# Patient Record
Sex: Female | Born: 1960 | Race: White | Hispanic: No | Smoking: Current every day smoker
Health system: Southern US, Community
[De-identification: ages and names within clinical notes are randomized; demographics above are authoritative.]

## PROBLEM LIST (undated history)

## (undated) DIAGNOSIS — G8929 Other chronic pain: Secondary | ICD-10-CM

## (undated) DIAGNOSIS — F1910 Other psychoactive substance abuse, uncomplicated: Secondary | ICD-10-CM

## (undated) DIAGNOSIS — J449 Chronic obstructive pulmonary disease, unspecified: Secondary | ICD-10-CM

## (undated) DIAGNOSIS — E119 Type 2 diabetes mellitus without complications: Secondary | ICD-10-CM

## (undated) DIAGNOSIS — I10 Essential (primary) hypertension: Secondary | ICD-10-CM

## (undated) DIAGNOSIS — F32A Depression, unspecified: Secondary | ICD-10-CM

## (undated) DIAGNOSIS — F319 Bipolar disorder, unspecified: Secondary | ICD-10-CM

## (undated) DIAGNOSIS — F329 Major depressive disorder, single episode, unspecified: Secondary | ICD-10-CM

## (undated) HISTORY — PX: SKIN CANCER EXCISION: SHX779

## (undated) HISTORY — PX: TUMOR REMOVAL: SHX12

## (undated) HISTORY — PX: BACK SURGERY: SHX140

## (undated) HISTORY — PX: CARPAL TUNNEL RELEASE: SHX101

---

## 2003-10-31 ENCOUNTER — Ambulatory Visit: Payer: Self-pay | Admitting: Pain Medicine

## 2003-11-30 ENCOUNTER — Ambulatory Visit: Payer: Self-pay | Admitting: Pain Medicine

## 2004-01-02 ENCOUNTER — Ambulatory Visit: Payer: Self-pay | Admitting: Pain Medicine

## 2004-01-25 ENCOUNTER — Ambulatory Visit: Payer: Self-pay | Admitting: Pain Medicine

## 2004-02-29 ENCOUNTER — Ambulatory Visit: Payer: Self-pay | Admitting: Pain Medicine

## 2004-03-28 ENCOUNTER — Ambulatory Visit: Payer: Self-pay | Admitting: Pain Medicine

## 2004-04-23 ENCOUNTER — Ambulatory Visit: Payer: Self-pay | Admitting: Pain Medicine

## 2004-05-23 ENCOUNTER — Ambulatory Visit: Payer: Self-pay | Admitting: Pain Medicine

## 2004-06-25 ENCOUNTER — Ambulatory Visit: Payer: Self-pay | Admitting: Pain Medicine

## 2004-07-25 ENCOUNTER — Ambulatory Visit: Payer: Self-pay | Admitting: Pain Medicine

## 2004-08-20 ENCOUNTER — Ambulatory Visit: Payer: Self-pay | Admitting: Pain Medicine

## 2004-08-23 ENCOUNTER — Ambulatory Visit: Payer: Self-pay | Admitting: Family Medicine

## 2004-09-26 ENCOUNTER — Ambulatory Visit: Payer: Self-pay | Admitting: Pain Medicine

## 2004-10-22 ENCOUNTER — Ambulatory Visit: Payer: Self-pay | Admitting: Pain Medicine

## 2004-11-21 ENCOUNTER — Ambulatory Visit: Payer: Self-pay | Admitting: Pain Medicine

## 2004-12-24 ENCOUNTER — Ambulatory Visit: Payer: Self-pay | Admitting: Pain Medicine

## 2005-01-21 ENCOUNTER — Ambulatory Visit: Payer: Self-pay | Admitting: Pain Medicine

## 2005-02-18 ENCOUNTER — Ambulatory Visit: Payer: Self-pay | Admitting: Pain Medicine

## 2005-03-18 ENCOUNTER — Ambulatory Visit: Payer: Self-pay | Admitting: Pain Medicine

## 2005-04-17 ENCOUNTER — Ambulatory Visit: Payer: Self-pay | Admitting: Pain Medicine

## 2005-05-15 ENCOUNTER — Ambulatory Visit: Payer: Self-pay | Admitting: Pain Medicine

## 2005-06-19 ENCOUNTER — Ambulatory Visit: Payer: Self-pay | Admitting: Pain Medicine

## 2005-07-22 ENCOUNTER — Ambulatory Visit: Payer: Self-pay | Admitting: Pain Medicine

## 2005-08-21 ENCOUNTER — Ambulatory Visit: Payer: Self-pay | Admitting: Pain Medicine

## 2005-09-23 ENCOUNTER — Ambulatory Visit: Payer: Self-pay | Admitting: Pain Medicine

## 2005-10-23 ENCOUNTER — Ambulatory Visit: Payer: Self-pay | Admitting: Pain Medicine

## 2005-11-18 ENCOUNTER — Ambulatory Visit: Payer: Self-pay | Admitting: Pain Medicine

## 2005-12-10 ENCOUNTER — Ambulatory Visit: Payer: Self-pay

## 2005-12-18 ENCOUNTER — Ambulatory Visit: Payer: Self-pay | Admitting: Pain Medicine

## 2006-01-20 ENCOUNTER — Ambulatory Visit: Payer: Self-pay | Admitting: Pain Medicine

## 2006-02-19 ENCOUNTER — Ambulatory Visit: Payer: Self-pay | Admitting: Pain Medicine

## 2006-03-19 ENCOUNTER — Ambulatory Visit: Payer: Self-pay | Admitting: Pain Medicine

## 2006-04-14 ENCOUNTER — Ambulatory Visit: Payer: Self-pay | Admitting: Pain Medicine

## 2006-05-14 ENCOUNTER — Ambulatory Visit: Payer: Self-pay | Admitting: Pain Medicine

## 2006-06-16 ENCOUNTER — Ambulatory Visit: Payer: Self-pay | Admitting: Pain Medicine

## 2006-07-14 ENCOUNTER — Ambulatory Visit: Payer: Self-pay | Admitting: Internal Medicine

## 2006-07-14 ENCOUNTER — Ambulatory Visit: Payer: Self-pay | Admitting: Pain Medicine

## 2006-07-23 ENCOUNTER — Ambulatory Visit: Payer: Self-pay | Admitting: Family Medicine

## 2006-08-11 ENCOUNTER — Ambulatory Visit: Payer: Self-pay | Admitting: Pain Medicine

## 2006-08-12 ENCOUNTER — Ambulatory Visit: Payer: Self-pay | Admitting: Internal Medicine

## 2006-08-14 ENCOUNTER — Ambulatory Visit: Payer: Self-pay | Admitting: Internal Medicine

## 2006-09-09 ENCOUNTER — Ambulatory Visit: Payer: Self-pay | Admitting: Internal Medicine

## 2006-09-10 ENCOUNTER — Ambulatory Visit: Payer: Self-pay | Admitting: Pain Medicine

## 2006-09-14 ENCOUNTER — Ambulatory Visit: Payer: Self-pay | Admitting: Internal Medicine

## 2006-10-13 ENCOUNTER — Ambulatory Visit: Payer: Self-pay | Admitting: Pain Medicine

## 2006-10-14 ENCOUNTER — Ambulatory Visit: Payer: Self-pay | Admitting: Internal Medicine

## 2006-11-10 ENCOUNTER — Ambulatory Visit: Payer: Self-pay | Admitting: Pain Medicine

## 2006-11-14 ENCOUNTER — Ambulatory Visit: Payer: Self-pay | Admitting: Internal Medicine

## 2006-11-16 ENCOUNTER — Ambulatory Visit: Payer: Self-pay | Admitting: Pain Medicine

## 2006-11-18 ENCOUNTER — Ambulatory Visit: Payer: Self-pay | Admitting: Internal Medicine

## 2006-11-21 ENCOUNTER — Ambulatory Visit: Payer: Self-pay | Admitting: Pain Medicine

## 2006-11-30 ENCOUNTER — Ambulatory Visit: Payer: Self-pay | Admitting: Pain Medicine

## 2006-12-08 ENCOUNTER — Emergency Department: Payer: Self-pay | Admitting: Emergency Medicine

## 2006-12-14 ENCOUNTER — Ambulatory Visit: Payer: Self-pay | Admitting: Internal Medicine

## 2006-12-14 ENCOUNTER — Ambulatory Visit: Payer: Self-pay | Admitting: Pain Medicine

## 2006-12-28 ENCOUNTER — Ambulatory Visit: Payer: Self-pay | Admitting: Pain Medicine

## 2007-01-12 ENCOUNTER — Ambulatory Visit: Payer: Self-pay | Admitting: Pain Medicine

## 2007-01-20 ENCOUNTER — Ambulatory Visit: Payer: Self-pay | Admitting: Pain Medicine

## 2007-02-11 ENCOUNTER — Ambulatory Visit: Payer: Self-pay | Admitting: Pain Medicine

## 2007-02-17 ENCOUNTER — Ambulatory Visit: Payer: Self-pay | Admitting: Pain Medicine

## 2007-03-02 ENCOUNTER — Ambulatory Visit: Payer: Self-pay | Admitting: Pain Medicine

## 2007-03-11 ENCOUNTER — Ambulatory Visit: Payer: Self-pay | Admitting: Pain Medicine

## 2007-03-19 ENCOUNTER — Ambulatory Visit: Payer: Self-pay | Admitting: Pain Medicine

## 2007-04-13 ENCOUNTER — Other Ambulatory Visit: Payer: Self-pay

## 2007-04-13 ENCOUNTER — Ambulatory Visit: Payer: Self-pay | Admitting: Unknown Physician Specialty

## 2007-04-13 ENCOUNTER — Ambulatory Visit: Payer: Self-pay | Admitting: Pain Medicine

## 2007-04-19 ENCOUNTER — Inpatient Hospital Stay: Payer: Self-pay | Admitting: Unknown Physician Specialty

## 2007-05-18 ENCOUNTER — Ambulatory Visit: Payer: Self-pay | Admitting: Pain Medicine

## 2007-05-24 ENCOUNTER — Ambulatory Visit: Payer: Self-pay | Admitting: Pain Medicine

## 2007-06-22 ENCOUNTER — Ambulatory Visit: Payer: Self-pay | Admitting: Pain Medicine

## 2007-06-30 ENCOUNTER — Ambulatory Visit: Payer: Self-pay | Admitting: Pain Medicine

## 2007-07-26 ENCOUNTER — Ambulatory Visit: Payer: Self-pay | Admitting: Pain Medicine

## 2007-08-11 ENCOUNTER — Ambulatory Visit: Payer: Self-pay | Admitting: Pain Medicine

## 2007-08-16 ENCOUNTER — Ambulatory Visit: Payer: Self-pay | Admitting: Pain Medicine

## 2007-09-06 ENCOUNTER — Ambulatory Visit: Payer: Self-pay | Admitting: Pain Medicine

## 2007-10-07 ENCOUNTER — Ambulatory Visit: Payer: Self-pay | Admitting: Pain Medicine

## 2007-11-03 ENCOUNTER — Ambulatory Visit: Payer: Self-pay | Admitting: Pain Medicine

## 2007-12-07 ENCOUNTER — Ambulatory Visit: Payer: Self-pay | Admitting: Pain Medicine

## 2007-12-15 ENCOUNTER — Ambulatory Visit: Payer: Self-pay | Admitting: Pain Medicine

## 2008-01-04 ENCOUNTER — Ambulatory Visit: Payer: Self-pay | Admitting: Pain Medicine

## 2008-02-03 ENCOUNTER — Ambulatory Visit: Payer: Self-pay | Admitting: Pain Medicine

## 2008-02-29 ENCOUNTER — Ambulatory Visit: Payer: Self-pay | Admitting: Pain Medicine

## 2008-03-29 ENCOUNTER — Ambulatory Visit: Payer: Self-pay | Admitting: Pain Medicine

## 2008-04-05 ENCOUNTER — Ambulatory Visit: Payer: Self-pay | Admitting: Pain Medicine

## 2008-05-02 ENCOUNTER — Ambulatory Visit: Payer: Self-pay | Admitting: Pain Medicine

## 2008-05-08 ENCOUNTER — Ambulatory Visit: Payer: Self-pay | Admitting: Pain Medicine

## 2008-06-01 ENCOUNTER — Ambulatory Visit: Payer: Self-pay | Admitting: Pain Medicine

## 2008-06-20 ENCOUNTER — Ambulatory Visit: Payer: Self-pay | Admitting: Family Medicine

## 2008-07-04 ENCOUNTER — Ambulatory Visit: Payer: Self-pay | Admitting: Pain Medicine

## 2008-07-10 ENCOUNTER — Ambulatory Visit: Payer: Self-pay | Admitting: Pain Medicine

## 2008-08-01 ENCOUNTER — Ambulatory Visit: Payer: Self-pay | Admitting: Pain Medicine

## 2008-08-25 ENCOUNTER — Emergency Department: Payer: Self-pay | Admitting: Emergency Medicine

## 2008-08-31 ENCOUNTER — Ambulatory Visit: Payer: Self-pay | Admitting: Pain Medicine

## 2008-09-28 ENCOUNTER — Ambulatory Visit: Payer: Self-pay | Admitting: Pain Medicine

## 2008-10-02 ENCOUNTER — Ambulatory Visit: Payer: Self-pay | Admitting: Pain Medicine

## 2008-10-31 ENCOUNTER — Ambulatory Visit: Payer: Self-pay | Admitting: Pain Medicine

## 2008-11-06 ENCOUNTER — Ambulatory Visit: Payer: Self-pay | Admitting: Pain Medicine

## 2008-11-30 ENCOUNTER — Ambulatory Visit: Payer: Self-pay | Admitting: Pain Medicine

## 2008-12-26 ENCOUNTER — Ambulatory Visit: Payer: Self-pay | Admitting: Pain Medicine

## 2009-01-01 ENCOUNTER — Ambulatory Visit: Payer: Self-pay | Admitting: Pain Medicine

## 2009-01-24 ENCOUNTER — Ambulatory Visit: Payer: Self-pay | Admitting: Pain Medicine

## 2009-02-26 ENCOUNTER — Ambulatory Visit: Payer: Self-pay | Admitting: Pain Medicine

## 2009-03-05 ENCOUNTER — Ambulatory Visit: Payer: Self-pay | Admitting: Pain Medicine

## 2009-03-27 ENCOUNTER — Ambulatory Visit: Payer: Self-pay | Admitting: Pain Medicine

## 2009-04-02 ENCOUNTER — Ambulatory Visit: Payer: Self-pay | Admitting: Pain Medicine

## 2009-04-24 ENCOUNTER — Ambulatory Visit: Payer: Self-pay | Admitting: Pain Medicine

## 2009-05-23 ENCOUNTER — Ambulatory Visit: Payer: Self-pay | Admitting: Pain Medicine

## 2009-06-21 ENCOUNTER — Ambulatory Visit: Payer: Self-pay | Admitting: Pain Medicine

## 2009-07-19 ENCOUNTER — Ambulatory Visit: Payer: Self-pay | Admitting: Pain Medicine

## 2009-07-25 ENCOUNTER — Ambulatory Visit: Payer: Self-pay | Admitting: Pain Medicine

## 2009-11-29 ENCOUNTER — Emergency Department: Payer: Self-pay | Admitting: Unknown Physician Specialty

## 2010-02-20 ENCOUNTER — Ambulatory Visit: Payer: Self-pay | Admitting: Family Medicine

## 2010-04-22 ENCOUNTER — Ambulatory Visit: Payer: Self-pay

## 2010-11-06 ENCOUNTER — Emergency Department: Payer: Self-pay | Admitting: Internal Medicine

## 2010-12-30 ENCOUNTER — Emergency Department: Payer: Self-pay | Admitting: Emergency Medicine

## 2011-11-04 ENCOUNTER — Emergency Department: Payer: Self-pay | Admitting: Emergency Medicine

## 2011-11-04 LAB — BASIC METABOLIC PANEL
Calcium, Total: 8.7 mg/dL (ref 8.5–10.1)
EGFR (Non-African Amer.): >60
Glucose: 104 mg/dL — ABNORMAL HIGH (ref 65–99)
Osmolality: 281 (ref 275–301)
Potassium: 4 mmol/L (ref 3.5–5.1)
Sodium: 141 mmol/L (ref 136–145)

## 2011-11-04 LAB — CBC
HGB: 14.5 g/dL (ref 12.0–16.0)
MCV: 94 fL (ref 80–100)
Platelet: 236 10*3/uL (ref 150–440)
RBC: 4.53 10*6/uL (ref 3.80–5.20)
WBC: 6.8 10*3/uL (ref 3.6–11.0)

## 2011-11-10 LAB — CULTURE, BLOOD (SINGLE)

## 2012-03-30 ENCOUNTER — Emergency Department: Payer: Self-pay | Admitting: Emergency Medicine

## 2012-05-10 ENCOUNTER — Emergency Department: Payer: Self-pay | Admitting: Emergency Medicine

## 2012-09-17 ENCOUNTER — Emergency Department: Payer: Self-pay | Admitting: Emergency Medicine

## 2012-11-08 ENCOUNTER — Emergency Department: Payer: Self-pay | Admitting: Emergency Medicine

## 2013-01-26 ENCOUNTER — Emergency Department: Payer: Self-pay | Admitting: Emergency Medicine

## 2013-01-27 ENCOUNTER — Emergency Department: Payer: Self-pay | Admitting: Emergency Medicine

## 2013-03-03 ENCOUNTER — Emergency Department: Payer: Self-pay | Admitting: Internal Medicine

## 2013-08-17 ENCOUNTER — Emergency Department: Payer: Self-pay | Admitting: Emergency Medicine

## 2013-10-22 ENCOUNTER — Emergency Department: Payer: Self-pay | Admitting: Emergency Medicine

## 2013-10-24 IMAGING — CR DG CHEST 2V
1 series · 3 of 3 positions shown · non-contrast
Comparison: none

REASON FOR EXAM: shortness of breath
COMMENTS:

PROCEDURE:     DXR - DXR CHEST PA (OR AP) AND LATERAL  - November 06, 2010 [DATE]
RESULT:     Comparison: 08/25/2008 and 04/13/2007

[Series 1: view not recorded · 0.17mm/px · 3 of 3 slices shown]
[im 1/3]
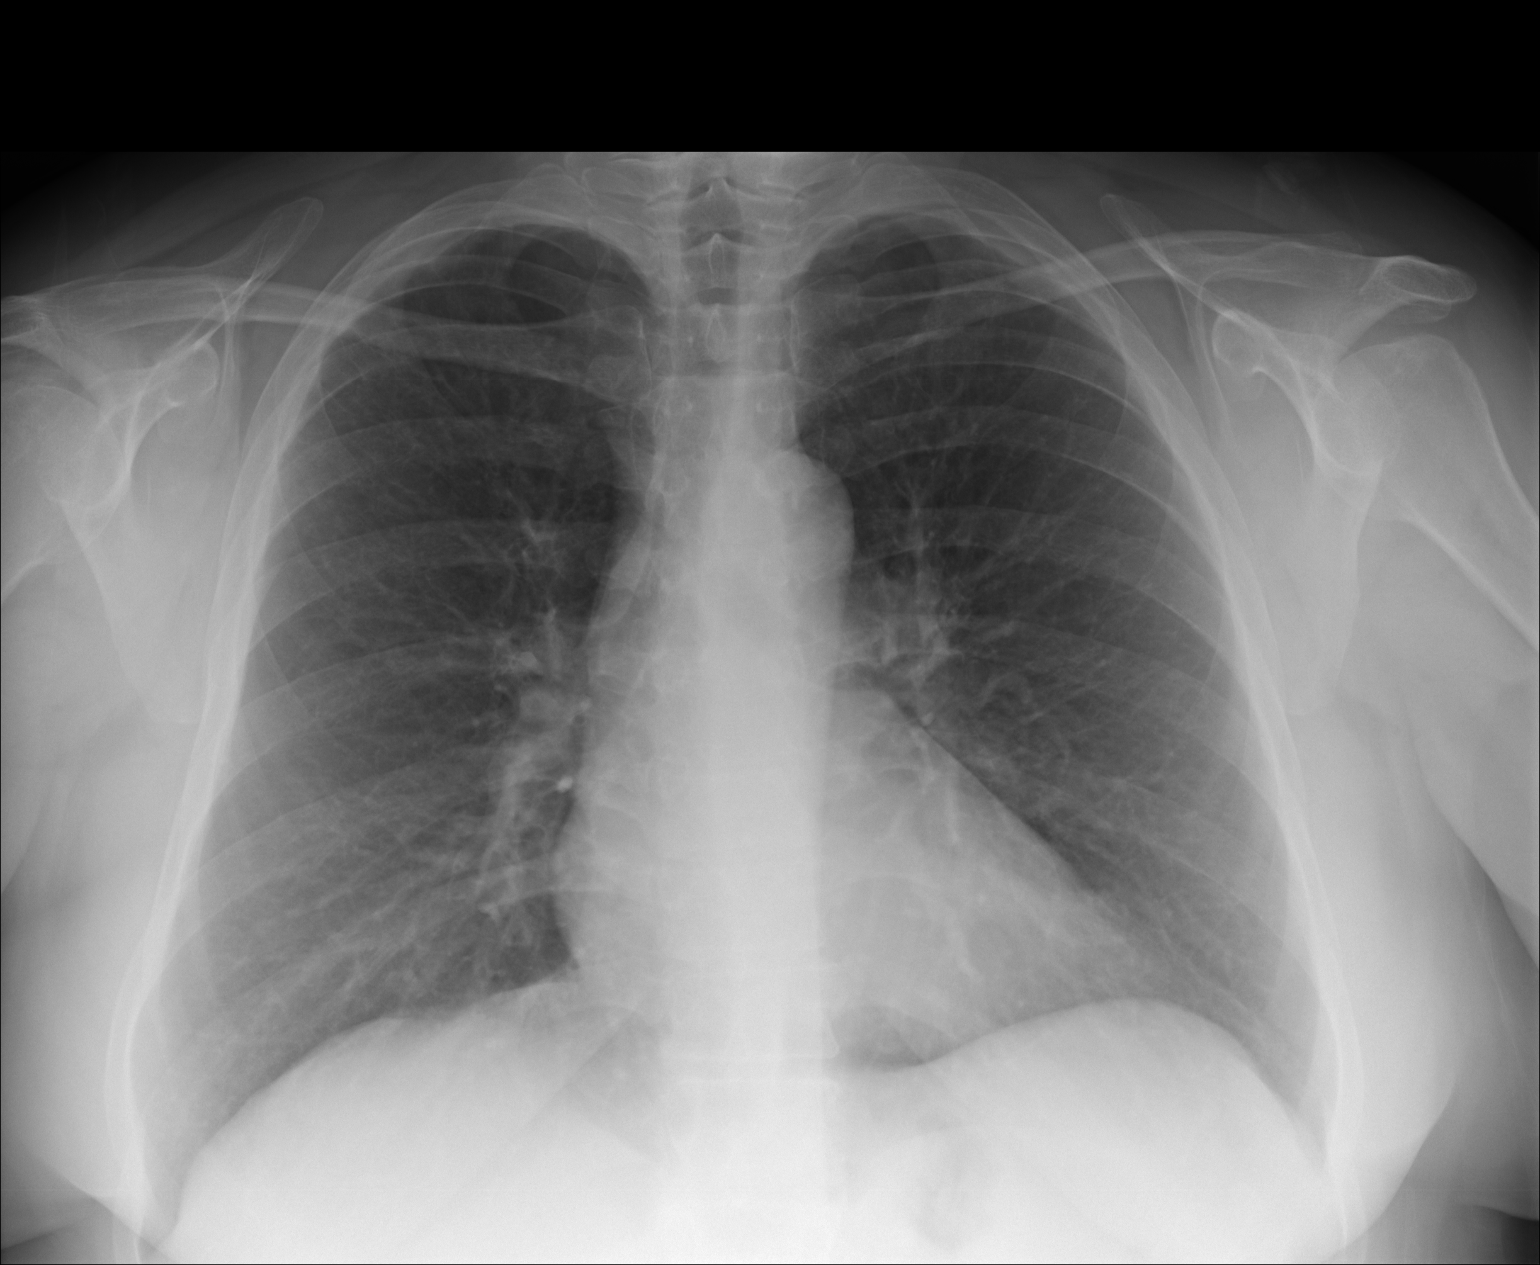
[im 2/3]
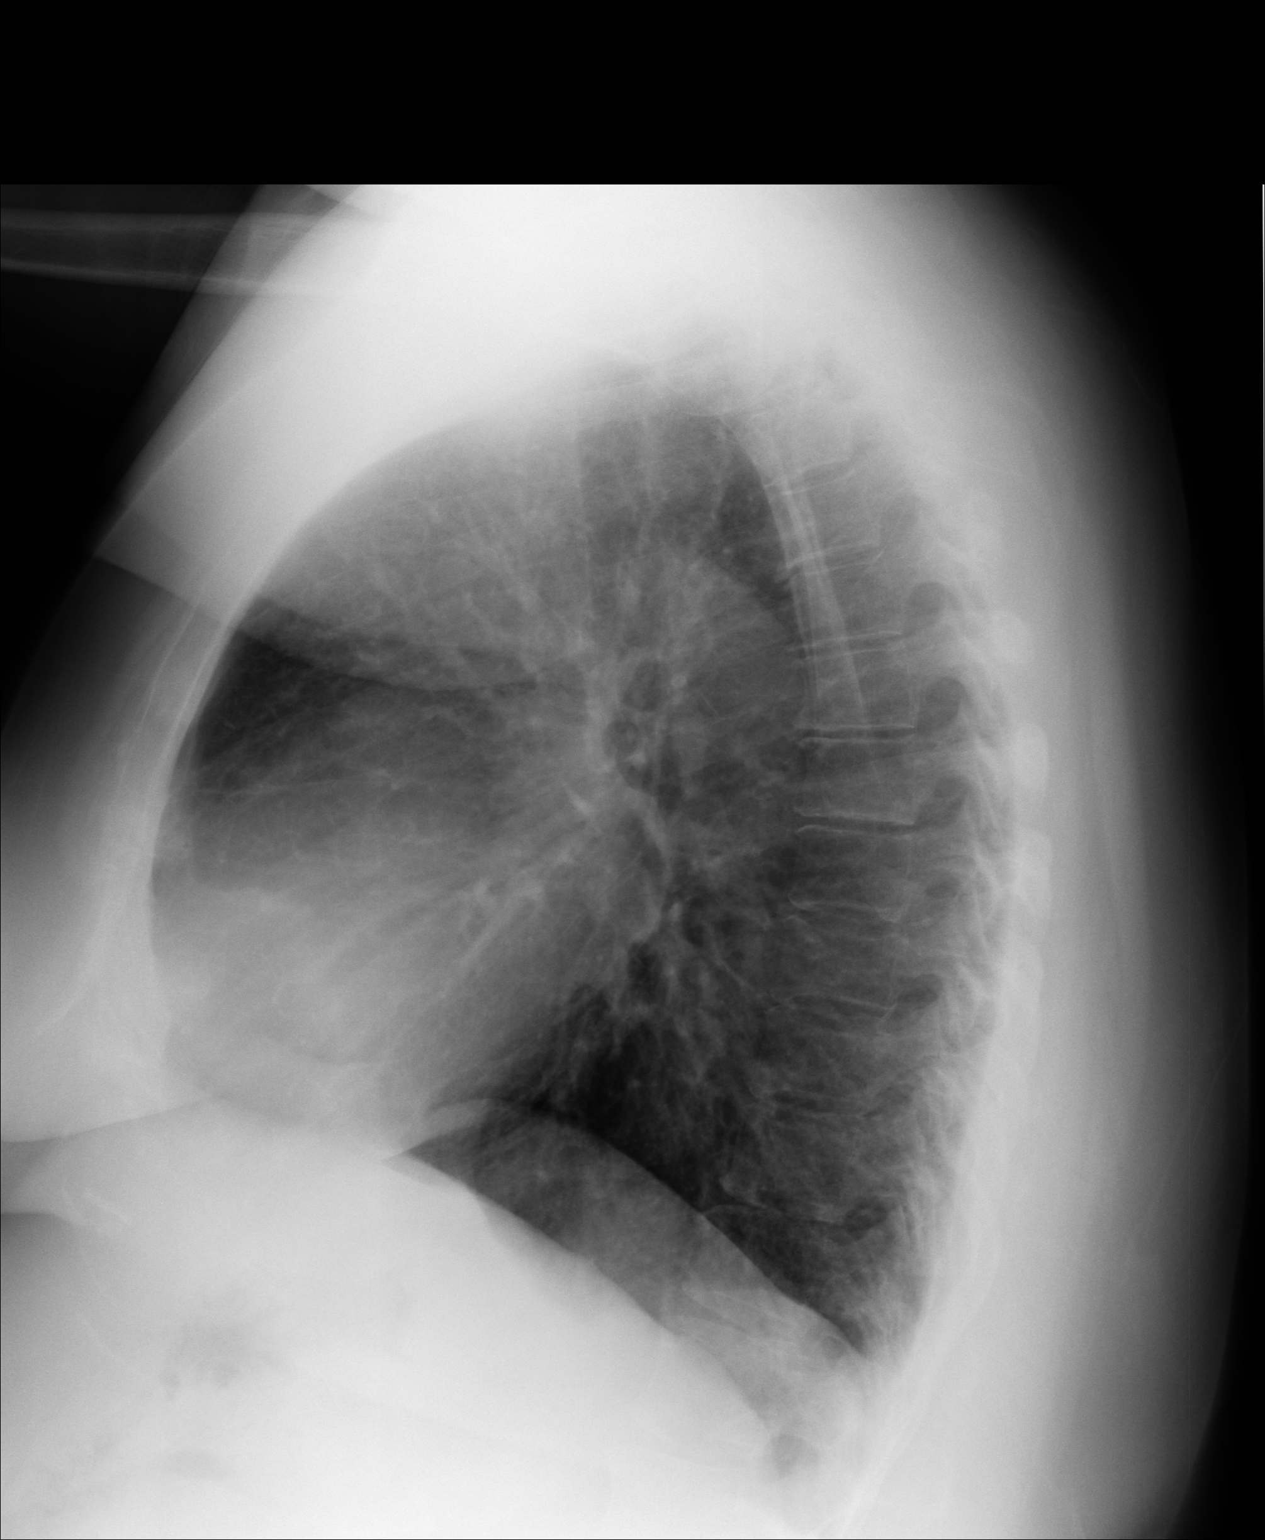
[im 3/3]
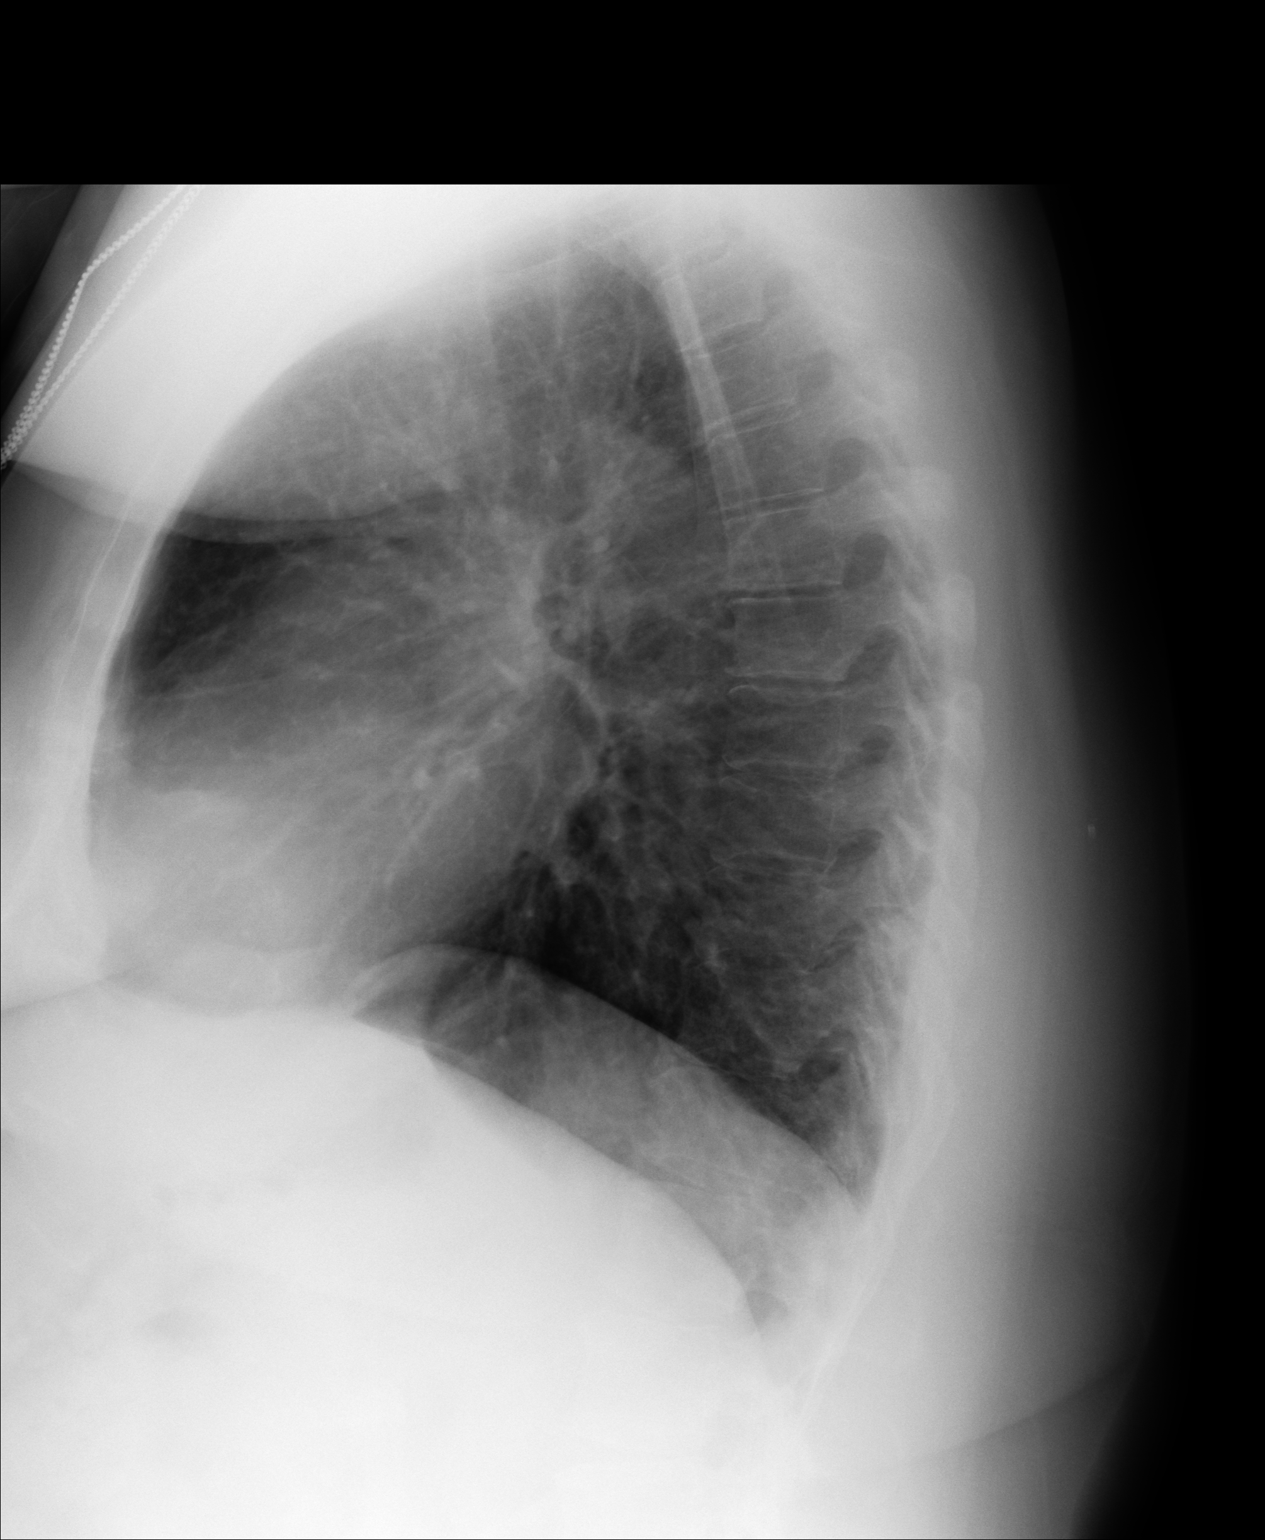

[3 of 3 positions shown; findings below may reference images not displayed]

FINDINGS: The heart and mediastinum are within normal limits. The lungs are clear.
Mild biapical pleural parenchymal scarring is similar to prior.
IMPRESSION: No acute cardiopulmonary disease.

## 2013-12-13 ENCOUNTER — Ambulatory Visit: Payer: Self-pay | Admitting: Family Medicine

## 2014-03-09 ENCOUNTER — Emergency Department: Payer: Self-pay | Admitting: Emergency Medicine

## 2014-06-28 ENCOUNTER — Emergency Department
Admission: EM | Admit: 2014-06-28 | Discharge: 2014-06-28 | Disposition: A | Discharge status: Home / Self Care | Payer: Medicare Other | Attending: Emergency Medicine | Admitting: Emergency Medicine

## 2014-06-28 ENCOUNTER — Encounter: Payer: Self-pay | Admitting: Emergency Medicine

## 2014-06-28 DIAGNOSIS — Z87891 Personal history of nicotine dependence: Secondary | ICD-10-CM | POA: Insufficient documentation

## 2014-06-28 DIAGNOSIS — E119 Type 2 diabetes mellitus without complications: Secondary | ICD-10-CM | POA: Insufficient documentation

## 2014-06-28 DIAGNOSIS — K130 Diseases of lips: Secondary | ICD-10-CM | POA: Diagnosis not present

## 2014-06-28 DIAGNOSIS — I10 Essential (primary) hypertension: Secondary | ICD-10-CM | POA: Insufficient documentation

## 2014-06-28 HISTORY — DX: Depression, unspecified: F32.A

## 2014-06-28 HISTORY — DX: Type 2 diabetes mellitus without complications: E11.9

## 2014-06-28 HISTORY — DX: Major depressive disorder, single episode, unspecified: F32.9

## 2014-06-28 HISTORY — DX: Other chronic pain: G89.29

## 2014-06-28 HISTORY — DX: Essential (primary) hypertension: I10

## 2014-06-28 MED ORDER — LIP MEDEX EX OINT: 1.0000 "application " | TOPICAL_OINTMENT | CUTANEOUS | Status: AC | PRN

## 2014-06-28 MED ORDER — CEPHALEXIN 500 MG PO CAPS: 500.0000 mg | ORAL_CAPSULE | Freq: Four times a day (QID) | ORAL | Status: AC

## 2014-06-28 NOTE — ED Notes (Signed)
Pt reports cut in lip that just "came up" pt states it has been there for 3 weeks. Pt states it will not heal and pain is getting worse.

## 2014-06-28 NOTE — ED Notes (Signed)
Pt informed to return if any life threatening symptoms occur.  

## 2014-06-28 NOTE — ED Provider Notes (Signed)
[facility] Emergency Department Provider Note  ____________________________________________  Time seen: 1331  I have reviewed the triage vital signs and the nursing notes.   HISTORY  Chief Complaint Lip Laceration    HPI Victoria Frey is a 54 y.o. female complains of 3 weeks of a worsening fissure in her lip states that she's been putting wax on it to try to keep it moist and now it's red and tender no other complaints this time denies any trauma to the area states that she is diabetic is concerned about infection rates it as about a 5 out of 10 pain when dries out her with anything sour or spicy and nothing seems to make it particularly better or worse otherwise here today for further evaluation and treatment   Past Medical History  Diagnosis Date  . Diabetes mellitus without complication   . Hypertension   . Chronic pain   . Depression     There are no active problems to display for this patient.   Past Surgical History  Procedure Laterality Date  . Back surgery    . Skin cancer excision    . Carpal tunnel release    . Tumor removal      Current Outpatient Rx  Name  Route  Sig  Dispense  Refill  . cephALEXin (KEFLEX) 500 MG capsule   Oral   Take 1 capsule (500 mg total) by mouth 4 (four) times daily.   28 capsule   0   . lip balm (CARMEX) ointment   Topical   Apply 1 application topically as needed for lip care.   7 g   0     Allergies Review of patient's allergies indicates no known allergies.  No family history on file.  Social History History  Substance Use Topics  . Smoking status: Former Research scientist (life sciences)  . Smokeless tobacco: Never Used  . Alcohol Use: No    Review of Systems Constitutional: No fever/chills Eyes: No visual changes. ENT: No sore throat. Cardiovascular: Denies chest pain. Respiratory: Denies shortness of breath. Gastrointestinal: No abdominal pain.  No nausea, no vomiting.  No diarrhea.  No  constipation. Genitourinary: Negative for dysuria. Musculoskeletal: Negative for back pain. Skin: Negative for rash. Neurological: Negative for headaches, focal weakness or numbness.  10-point ROS otherwise negative.  ____________________________________________   PHYSICAL EXAM:  VITAL SIGNS: ED Triage Vitals  Enc Vitals Group     BP 06/28/14 1201 162/83 mmHg     Pulse Rate 06/28/14 1201 79     Resp 06/28/14 1201 18     Temp 06/28/14 1201 97.4 F (36.3 C)     Temp Source 06/28/14 1201 Oral     SpO2 06/28/14 1201 97 %     Weight 06/28/14 1201 208 lb (94.348 kg)     Height 06/28/14 1201 5\' 7"  (1.702 m)     Head Cir --      Peak Flow --      Pain Score 06/28/14 1205 5     Pain Loc --      Pain Edu? --      Excl. in [place]? --     Constitutional: Alert and oriented. Well appearing and in no acute distress. Eyes: Conjunctivae are normal. PERRL. EOMI. Head: Atraumatic. Nose: No congestion/rhinnorhea. Mouth/Throat: Lower lip fissure Neck: No stridor.   Cardiovascular: Normal rate, regular rhythm. Grossly normal heart sounds.  Good peripheral circulation. Respiratory: Normal respiratory effort.  No retractions. Lungs CTAB. Musculoskeletal: No lower extremity  tenderness nor edema.  No joint effusions. Neurologic:  Normal speech and language. No gross focal neurologic deficits are appreciated. Speech is normal. No gait instability. Skin:  Skin is warm, dry and intact. No rash noted. Psychiatric: Mood and affect are normal. Speech and behavior are normal.  ____________________________________________   PROCEDURES  Procedure(s) performed: None  Critical Care performed: No  ____________________________________________   INITIAL IMPRESSION / ASSESSMENT AND PLAN / ED COURSE  Pertinent labs & imaging results that were available during my care of the patient were reviewed by me and considered in my medical decision making (see chart for details).  Initial impression dry lip  and lip fissure lip is a little red and swollen we'll start her on Keflex as well as a prescription for lip balm recommend moisturizing her lip and following up with ear nose and throat if it persist return here for any acute concerns or worsening symptoms ____________________________________________   FINAL CLINICAL IMPRESSION(S) / ED DIAGNOSES  Final diagnoses:  Lip fissure     Delainee Tramel Verdene Rio, PA-C 06/28/14 [place], MD 06/29/14 [id number]

## 2014-07-17 ENCOUNTER — Emergency Department
Admission: EM | Admit: 2014-07-17 | Discharge: 2014-07-17 | Disposition: A | Discharge status: Home / Self Care | Payer: Medicare Other | Attending: Emergency Medicine | Admitting: Emergency Medicine

## 2014-07-17 ENCOUNTER — Encounter: Payer: Self-pay | Admitting: *Deleted

## 2014-07-17 DIAGNOSIS — S40812A Abrasion of left upper arm, initial encounter: Secondary | ICD-10-CM | POA: Insufficient documentation

## 2014-07-17 DIAGNOSIS — S8991XA Unspecified injury of right lower leg, initial encounter: Secondary | ICD-10-CM | POA: Diagnosis present

## 2014-07-17 DIAGNOSIS — S20419A Abrasion of unspecified back wall of thorax, initial encounter: Secondary | ICD-10-CM | POA: Diagnosis not present

## 2014-07-17 DIAGNOSIS — E119 Type 2 diabetes mellitus without complications: Secondary | ICD-10-CM | POA: Insufficient documentation

## 2014-07-17 DIAGNOSIS — Y9389 Activity, other specified: Secondary | ICD-10-CM | POA: Diagnosis not present

## 2014-07-17 DIAGNOSIS — S40811A Abrasion of right upper arm, initial encounter: Secondary | ICD-10-CM | POA: Diagnosis not present

## 2014-07-17 DIAGNOSIS — Y998 Other external cause status: Secondary | ICD-10-CM | POA: Diagnosis not present

## 2014-07-17 DIAGNOSIS — I10 Essential (primary) hypertension: Secondary | ICD-10-CM | POA: Insufficient documentation

## 2014-07-17 DIAGNOSIS — Z87891 Personal history of nicotine dependence: Secondary | ICD-10-CM | POA: Diagnosis not present

## 2014-07-17 DIAGNOSIS — S80811A Abrasion, right lower leg, initial encounter: Secondary | ICD-10-CM | POA: Diagnosis not present

## 2014-07-17 DIAGNOSIS — Y92194 Driveway of other specified residential institution as the place of occurrence of the external cause: Secondary | ICD-10-CM | POA: Insufficient documentation

## 2014-07-17 DIAGNOSIS — S80812A Abrasion, left lower leg, initial encounter: Secondary | ICD-10-CM | POA: Insufficient documentation

## 2014-07-17 DIAGNOSIS — T07XXXA Unspecified multiple injuries, initial encounter: Secondary | ICD-10-CM

## 2014-07-17 HISTORY — DX: Bipolar disorder, unspecified: F31.9

## 2014-07-17 MED ORDER — OXYCODONE-ACETAMINOPHEN 5-325 MG PO TABS
2.0000 | ORAL_TABLET | Freq: Once | ORAL | Status: AC
  Administered 2014-07-17: 2 via ORAL

## 2014-07-17 MED ORDER — OXYCODONE-ACETAMINOPHEN 5-325 MG PO TABS
ORAL_TABLET | ORAL | Status: AC
  Filled 2014-07-17: qty 2

## 2014-07-17 MED ORDER — HYDROCODONE-ACETAMINOPHEN 5-325 MG PO TABS: 1.0000 | ORAL_TABLET | ORAL | Status: DC | PRN
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
## 2014-07-17 NOTE — Discharge Instructions (Signed)
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
## 2014-08-02 ENCOUNTER — Emergency Department
Admission: EM | Admit: 2014-08-02 | Discharge: 2014-08-02 | Disposition: A | Discharge status: Home / Self Care | Payer: Medicare Other | Attending: Emergency Medicine | Admitting: Emergency Medicine

## 2014-08-02 ENCOUNTER — Encounter: Payer: Self-pay | Admitting: Emergency Medicine

## 2014-08-02 ENCOUNTER — Emergency Department: Payer: Medicare Other

## 2014-08-02 DIAGNOSIS — J0100 Acute maxillary sinusitis, unspecified: Secondary | ICD-10-CM | POA: Diagnosis not present

## 2014-08-02 DIAGNOSIS — E119 Type 2 diabetes mellitus without complications: Secondary | ICD-10-CM | POA: Diagnosis not present

## 2014-08-02 DIAGNOSIS — Y998 Other external cause status: Secondary | ICD-10-CM | POA: Diagnosis not present

## 2014-08-02 DIAGNOSIS — Z87891 Personal history of nicotine dependence: Secondary | ICD-10-CM | POA: Insufficient documentation

## 2014-08-02 DIAGNOSIS — Y9389 Activity, other specified: Secondary | ICD-10-CM | POA: Insufficient documentation

## 2014-08-02 DIAGNOSIS — I10 Essential (primary) hypertension: Secondary | ICD-10-CM | POA: Diagnosis not present

## 2014-08-02 DIAGNOSIS — Y9289 Other specified places as the place of occurrence of the external cause: Secondary | ICD-10-CM | POA: Diagnosis not present

## 2014-08-02 DIAGNOSIS — M7981 Nontraumatic hematoma of soft tissue: Secondary | ICD-10-CM | POA: Diagnosis not present

## 2014-08-02 DIAGNOSIS — X58XXXA Exposure to other specified factors, initial encounter: Secondary | ICD-10-CM | POA: Insufficient documentation

## 2014-08-02 DIAGNOSIS — S0031XA Abrasion of nose, initial encounter: Secondary | ICD-10-CM | POA: Insufficient documentation

## 2014-08-02 DIAGNOSIS — M25511 Pain in right shoulder: Secondary | ICD-10-CM | POA: Diagnosis present

## 2014-08-02 MED ORDER — MELOXICAM 15 MG PO TABS: 15.0000 mg | ORAL_TABLET | Freq: Every day | ORAL | Status: DC | PRN

## 2014-08-02 MED ORDER — TRAMADOL HCL 50 MG PO TABS: 50.0000 mg | ORAL_TABLET | Freq: Three times a day (TID) | ORAL | Status: DC | PRN

## 2014-08-02 MED ORDER — AMOXICILLIN-POT CLAVULANATE 875-125 MG PO TABS: 1.0000 | ORAL_TABLET | Freq: Two times a day (BID) | ORAL | Status: AC

## 2014-08-02 NOTE — ED Notes (Signed)
Pt with right shoulder pain x 2 months. Sent to orthopedic doc by med doc. Not seen by ortho. No obvious injury. Also c/o sinus infection, sores in nose and "sever" headache

## 2014-08-02 NOTE — Discharge Instructions (Signed)
Take medication as prescribed. Alternate heat and ice for comfort. Wear sling but stretch multiple times a day. Stretch shoulder as discussed and demonstrated multiple times per day. Avoid overly strenuous activity or repetitive motions with right arm.   Follow-up with your primary care physician this week. Follow-up with orthopedics this week for continued shoulder pain.  Return to the ER immediately for increased pain, new or worsening concerns.  Shoulder Pain The shoulder is the joint that connects your arms to your body. The bones that form the shoulder joint include the upper arm bone (humerus), the shoulder blade (scapula), and the collarbone (clavicle). The top of the humerus is shaped like a ball and fits into a rather flat socket on the scapula (glenoid cavity). A combination of muscles and strong, fibrous tissues that connect muscles to bones (tendons) support your shoulder joint and hold the ball in the socket. Small, fluid-filled sacs (bursae) are located in different areas of the joint. They act as cushions between the bones and the overlying soft tissues and help reduce friction between the gliding tendons and the bone as you move your arm. Your shoulder joint allows a wide range of motion in your arm. This range of motion allows you to do things like scratch your back or throw a ball. However, this range of motion also makes your shoulder more prone to pain from overuse and injury. Causes of shoulder pain can originate from both injury and overuse and usually can be grouped in the following four categories:  Redness, swelling, and pain (inflammation) of the tendon (tendinitis) or the bursae (bursitis).  Instability, such as a dislocation of the joint.  Inflammation of the joint (arthritis).  Broken bone (fracture). HOME CARE INSTRUCTIONS   Apply ice to the sore area.  Put ice in a plastic bag.  Place a towel between your skin and the bag.  Leave the ice on for 15-20 minutes,  3-4 times per day for the first 2 days, or as directed by your health care provider.  Stop using cold packs if they do not help with the pain.  If you have a shoulder sling or immobilizer, wear it as long as your caregiver instructs. Only remove it to shower or bathe. Move your arm as little as possible, but keep your hand moving to prevent swelling.  Squeeze a soft ball or foam pad as much as possible to help prevent swelling.  Only take over-the-counter or prescription medicines for pain, discomfort, or fever as directed by your caregiver. SEEK MEDICAL CARE IF:   Your shoulder pain increases, or new pain develops in your arm, hand, or fingers.  Your hand or fingers become cold and numb.  Your pain is not relieved with medicines. SEEK IMMEDIATE MEDICAL CARE IF:   Your arm, hand, or fingers are numb or tingling.  Your arm, hand, or fingers are significantly swollen or turn white or blue. MAKE SURE YOU:   Understand these instructions.  Will watch your condition.  Will get help right away if you are not doing well or get worse. Document Released: 10/09/2004 Document Revised: 05/16/2013 Document Reviewed: 12/14/2010 [facility] Patient Information 2015 [place], [place]. This information is not intended to replace advice given to you by your health care provider. Make sure you discuss any questions you have with your health care provider.  Shoulder Range of Motion Exercises The shoulder is the most flexible joint in the human body. Because of this it is also the most unstable joint in the  body. All ages can develop shoulder problems. Early treatment of problems is necessary for a good outcome. People react to shoulder pain by decreasing the movement of the joint. After a brief period of time, the shoulder can become "frozen". This is an almost complete loss of the ability to move the damaged shoulder. Following injuries your caregivers can give you instructions on exercises to keep your  range of motion (ability to move your shoulder freely), or regain it if it has been lost.  [place]: Codman's Exercise or Pendulum Exercise  This exercise may be performed in a prone (face-down) lying position or standing while leaning on a chair with the opposite arm. Its purpose is to relax the muscles in your shoulder and slowly but surely increase the range of motion and to relieve pain.  Lie on your stomach close to the side edge of the bed. Let your weak arm hang over the edge of the bed. Relax your shoulder, arm and hand. Let your shoulder blade relax and drop down.  Slowly and gently swing your arm forward and back. Do not use your neck muscles; relax them. It might be easier to have someone else gently start swinging your arm.  As pain decreases, increase your swing. To start, arm swing should begin at 15 degree angles. In time and as pain lessens, move to 30-45 degree angles. Start with swinging for about 15 seconds, and work towards swinging for 3 to 5 minutes.  This exercise may also be performed in a standing/bent over position.  Stand and hold onto a sturdy chair with your good arm. Bend forward at the waist and bend your knees slightly to help protect your back. Relax your weak arm, let it hang limp. Relax your shoulder blade and let it drop.  Keep your shoulder relaxed and use body motion to swing your arm in small circles.  Stand up tall and relax.  Repeat motion and change direction of circles.  Start with swinging for about 30 seconds, and work towards swinging for 3 to 5 minutes. STRETCHING EXERCISES:  Lift your arm out in front of you with the elbow bent at 90 degrees. Using your other arm gently pull the elbow forward and across your body.  Bend one arm behind you with the palm facing outward. Using the other arm, hold a towel or rope and reach this arm up above your head, then bend it at the elbow to move your  wrist to behind your neck. Grab the free end of the towel with the hand behind your back. Gently pull the towel up with the hand behind your neck, gradually increasing the pull on the hand behind the small of your back. Then, gradually pull down with the hand behind the small of your back. This will pull the hand and arm behind your neck further. Both shoulders will have an increased range of motion with repetition of this exercise. STRENGTHENING EXERCISES:  Standing with your arm at your side and straight out from your shoulder with the elbow bent at 90 degrees, hold onto a small weight and slowly raise your hand so it points straight up in the air. Repeat this five times to begin with, and gradually increase to ten times. Do this four times per day. As you grow stronger you can gradually increase the weight.  Repeat the above exercise, only this time using an elastic band. Start with your hand up in the air  and pull down until your hand is by your side. As you grow stronger, gradually increase the amount you pull by increasing the number or size of the elastic bands. Use the same amount of repetitions.  Standing with your hand at your side and holding onto a weight, gradually lift the hand in front of you until it is over your head. Do the same also with the hand remaining at your side and lift the hand away from your body until it is again over your head. Repeat this five times to begin with, and gradually increase to ten times. Do this four times per day. As you grow stronger you can gradually increase the weight. Document Released: 09/28/2002 Document Revised: 01/04/2013 Document Reviewed: 12/30/2004 [facility] Patient Information 2015 [place], [place]. This information is not intended to replace advice given to you by your health care provider. Make sure you discuss any questions you have with your health care provider.  Sinusitis Sinusitis is redness, soreness, and inflammation of the paranasal  sinuses. Paranasal sinuses are air pockets within the bones of your face (beneath the eyes, the middle of the forehead, or above the eyes). In healthy paranasal sinuses, mucus is able to drain out, and air is able to circulate through them by way of your nose. However, when your paranasal sinuses are inflamed, mucus and air can become trapped. This can allow bacteria and other germs to grow and cause infection. Sinusitis can develop quickly and last only a short time (acute) or continue over a long period (chronic). Sinusitis that lasts for more than 12 weeks is considered chronic.  CAUSES  Causes of sinusitis include:  Allergies.  Structural abnormalities, such as displacement of the cartilage that separates your nostrils (deviated septum), which can decrease the air flow through your nose and sinuses and affect sinus drainage.  Functional abnormalities, such as when the small hairs (cilia) that line your sinuses and help remove mucus do not work properly or are not present. SIGNS AND SYMPTOMS  Symptoms of acute and chronic sinusitis are the same. The primary symptoms are pain and pressure around the affected sinuses. Other symptoms include:  Upper toothache.  Earache.  Headache.  Bad breath.  Decreased sense of smell and taste.  A cough, which worsens when you are lying flat.  Fatigue.  Fever.  Thick drainage from your nose, which often is green and may contain pus (purulent).  Swelling and warmth over the affected sinuses. DIAGNOSIS  Your health care provider will perform a physical exam. During the exam, your health care provider may:  Look in your nose for signs of abnormal growths in your nostrils (nasal polyps).  Tap over the affected sinus to check for signs of infection.  View the inside of your sinuses (endoscopy) using an imaging device that has a light attached (endoscope). If your health care provider suspects that you have chronic sinusitis, one or more of the  following tests may be recommended:  Allergy tests.  Nasal culture. A sample of mucus is taken from your nose, sent to a lab, and screened for bacteria.  Nasal cytology. A sample of mucus is taken from your nose and examined by your health care provider to determine if your sinusitis is related to an allergy. TREATMENT  Most cases of acute sinusitis are related to a viral infection and will resolve on their own within 10 days. Sometimes medicines are prescribed to help relieve symptoms (pain medicine, decongestants, nasal steroid sprays, or saline sprays).  However,  for sinusitis related to a bacterial infection, your health care provider will prescribe antibiotic medicines. These are medicines that will help kill the bacteria causing the infection.  Rarely, sinusitis is caused by a fungal infection. In theses cases, your health care provider will prescribe antifungal medicine. For some cases of chronic sinusitis, surgery is needed. Generally, these are cases in which sinusitis recurs more than 3 times per year, despite other treatments. HOME CARE INSTRUCTIONS   Drink plenty of water. Water helps thin the mucus so your sinuses can drain more easily.  Use a humidifier.  Inhale steam 3 to 4 times a day (for example, sit in the bathroom with the shower running).  Apply a warm, moist washcloth to your face 3 to 4 times a day, or as directed by your health care provider.  Use saline nasal sprays to help moisten and clean your sinuses.  Take medicines only as directed by your health care provider.  If you were prescribed either an antibiotic or antifungal medicine, finish it all even if you start to feel better. SEEK IMMEDIATE MEDICAL CARE IF:  You have increasing pain or severe headaches.  You have nausea, vomiting, or drowsiness.  You have swelling around your face.  You have vision problems.  You have a stiff neck.  You have difficulty breathing. MAKE SURE YOU:   Understand  these instructions.  Will watch your condition.  Will get help right away if you are not doing well or get worse. Document Released: 12/30/2004 Document Revised: 05/16/2013 Document Reviewed: 01/14/2011 [facility] Patient Information 2015 [place], [place]. This information is not intended to replace advice given to you by your health care provider. Make sure you discuss any questions you have with your health care provider.

## 2014-08-02 NOTE — ED Provider Notes (Signed)
[facility] Department Provider Note  ____________________________________________  Time seen: Approximately 4:05 PM  I have reviewed the triage vital signs and the nursing notes.   HISTORY  Chief Complaint Extremity Pain   HPI Victoria Frey is a 54 y.o. female presents the ER for complaints of right shoulder pain. Patient reports that she has had pain in her right shoulder 2 months. States pain unchanged x 2 months. Patient reports that her primary care physician said that it may be tendinitis, arthritis or rotator cuff injury and has referred her to orthopedics. Patient states that she has not seen orthopedics yet as she owes them money. Patient denies fall or known injury to her right shoulder. Patient states right shoulder pain is primarily with movement. Patient states that it hurts worse when bringing her shoulder out to the side and lifting right shoulder. Patient states that pain currently to her right shoulder is 7 out of 10. Denies paresthesias, numbness or tingling sensations. Denies weakness in right arm or right hand. Denies chest pain or shortness of breath. Denies pain radiation.   Patient also reports that while she is here she wanted to be checked out for sinus infection. Patient states that she has had green purulent runny nose 2 weeks. Also reports 2 weeks with sinus pressure and sinus headache. States headache is in the front of her forehead and around her nose. Denies other headache. Patient reports started as runny nose but then progressively worsened and has had continuous nose discharge and sinus pressure. Denies cough or shortness of breath. Denies fever. Denies dizziness.Denies other complaints. Reports continues to eat and drink well.  Denies dizziness, vision changes, chest pain, shortness of breath, abdominal pain, decreased appetite, extremity pain, weakness, confusion or other complaints.    Past Medical History  Diagnosis  Date  . Diabetes mellitus without complication   . Hypertension   . Chronic pain   . Depression   . Bipolar 1 disorder     There are no active problems to display for this patient.   Past Surgical History  Procedure Laterality Date  . Back surgery    . Skin cancer excision    . Carpal tunnel release    . Tumor removal      Current Outpatient Rx  Name  Route  Sig  Dispense  Refill  .           Marland Kitchen             Allergies Review of patient's allergies indicates no known allergies.  No family history on file.  Social History History  Substance Use Topics  . Smoking status: Former Research scientist (life sciences)  . Smokeless tobacco: Never Used  . Alcohol Use: No   PCP: Posey Pronto  Review of Systems Constitutional: No fever/chills Eyes: No visual changes. ENT: No sore throat. Positive for congestion and sinus pressure. Cardiovascular: Denies chest pain. Respiratory: Denies shortness of breath. Gastrointestinal: No abdominal pain.  No nausea, no vomiting.  No diarrhea.  No constipation. Genitourinary: Negative for dysuria. Musculoskeletal: Negative for back pain. Positive for right shoulder pain. Skin: Negative for rash. Neurological: Positive for headache. Negative for focal weakness or numbness.  10-point ROS otherwise negative.  ____________________________________________   PHYSICAL EXAM:  VITAL SIGNS: ED Triage Vitals  Enc Vitals Group     BP 08/02/14 1541 162/90 mmHg     Pulse Rate 08/02/14 1541 62     Resp 08/02/14 1541 20     Temp 08/02/14 1541  97.5 F (36.4 C)     Temp Source 08/02/14 1541 Oral     SpO2 08/02/14 1541 98 %     Weight 08/02/14 1541 218 lb (98.884 kg)     Height 08/02/14 1541 5\' 7"  (1.702 m)     Head Cir --      Peak Flow --      Pain Score 08/02/14 1549 8     Pain Loc --      Pain Edu? --      Excl. in [place]? --     Constitutional: Alert and oriented. Well appearing and in no acute distress. Eyes: Conjunctivae are normal. PERRL. EOMI. Head: Atraumatic.  Mild to mod maxillary and fontal sinus pressure to palpation.  Ears: no erythema, normal TMs.  Nose: mild rhinorrhea. No intranasal lesions.  Mouth/Throat: Mucous membranes are moist.  Oropharynx non-erythematous. Neck: No stridor.  No cervical spine tenderness to palpation. Hematological/Lymphatic/Immunilogical: No cervical lymphadenopathy. Cardiovascular: Normal rate, regular rhythm. Grossly normal heart sounds.  Good peripheral circulation. Respiratory: Normal respiratory effort.  No retractions. Lungs CTAB. Gastrointestinal: Soft and nontender. No abdominal bruits. No CVA tenderness. normal bowel sounds. Musculoskeletal: No lower or upper extremity tenderness nor edema.  No joint effusions. Except: right shoulder anterior and lateral mod TTP, no swelling, mild small ecchymosis lateral shoulder. Full ROM. Pain with shoulder ROM, worse pain with right arm abduction. No pain to right arm below shoulder. NO cervical, thoracic, or lumbar TTP. Bilateral hand grips equal. Bilateral distal radial pulses equal. 5/5 strength to bilateral upper and lower extremities.  Neurologic:  Normal speech and language. No gross focal neurologic deficits are appreciated. No gait instability. CN 2-12 grossly intact. Alert and oriented.  Skin:  Skin is warm, dry and intact. No rash noted. Except: superficial abrasion small at base of left nare (per patient from rubbing nose). NO other abrasions or lesions noted. Psychiatric: Mood and affect are normal. Speech and behavior are normal.  ____________________________________________   LABS (all labs ordered are listed, but only abnormal results are displayed)  Labs Reviewed - No data to display  RADIOLOGY RIGHT SHOULDER - 2+ VIEW  COMPARISON: None.  FINDINGS: No acute bony abnormality. Glenohumeral joint appears congruent. Subacromial spurring. Unremarkable appearance of the visualized thorax.  IMPRESSION: No acute bony abnormality.  Subacromial  spurring.  Signed,  Dulcy Fanny. Earleen Newport, DO  [facility]  [facility]   Electronically Signed By: Corrie Mckusick D.O. On: 08/02/2014 16:24      I, Marylene Land, personally viewed and evaluated these images as part of my medical decision making.    ____________________________________________   PROCEDURES  Procedure(s) performed:  SPLINT APPLICATION Date/Time: 7:67 PM Authorized by: Marylene Land Consent: Verbal consent obtained. Risks and benefits: risks, benefits and alternatives were discussed Consent given by: patient Splint applied by: ed technician Location details: right sling Post-procedure: The splinted body part was neurovascularly unchanged following the procedure. Patient tolerance: Patient tolerated the procedure well with no immediate complications.     INITIAL IMPRESSION / ASSESSMENT AND PLAN / ED COURSE  Pertinent labs & imaging results that were available during my care of the patient were reviewed by me and considered in my medical decision making (see chart for details).  Presents to the ER for the complaints of 2 months of right shoulder pain. Patient states that right shoulder pain is primarily with movement. Patient states that she has been seen by her primary care physician for the same. Patient states that her  primary care physician told her it may be tendinitis, arthritis or rotator cuff injury. Patient reports that she was referred to orthopedics but has not yet seen orthopedics as she had an outstanding bill with him. Right shoulder x-ray no acute bony abnormality. Positive subacromial spurring. Discussed with patient to follow-up with orthopedics for continued right shoulder pain. We'll provide sling for support. Discussed and demonstrated right shoulder range of motion exercises. We'll treat right shoulder pain with oral 15 mg Mobic daily as well as when necessary tramadol.  Patient also  presented to the ER for the complaints of sinus infection. Patient reports that she has had runny nose, congestion and frontal sinus pressure 2 weeks. Patient reports having sinus headache accompanying sinus drainage. Will treat patient with oral Augmentin for sinusitis. Patient to follow up with primary care physician this week. Patient denies other complaints and very well-appearing patient. Steady gait in ER. Discussed follow-up and return parameters. Patient verbalized understanding and agreed to plan.   ____________________________________________   FINAL CLINICAL IMPRESSION(S) / ED DIAGNOSES  Final diagnoses:  Right shoulder pain  Acute maxillary sinusitis, recurrence not specified      Marylene Land, NP 08/02/14 1722  Orbie Pyo, MD 08/02/14 [id number]

## 2014-08-29 ENCOUNTER — Emergency Department
Admission: EM | Admit: 2014-08-29 | Discharge: 2014-08-30 | Disposition: A | Discharge status: Home / Self Care | Payer: Medicare Other | Attending: Emergency Medicine | Admitting: Emergency Medicine

## 2014-08-29 ENCOUNTER — Encounter: Payer: Self-pay | Admitting: Emergency Medicine

## 2014-08-29 DIAGNOSIS — E119 Type 2 diabetes mellitus without complications: Secondary | ICD-10-CM | POA: Diagnosis not present

## 2014-08-29 DIAGNOSIS — F1410 Cocaine abuse, uncomplicated: Secondary | ICD-10-CM

## 2014-08-29 DIAGNOSIS — Z87891 Personal history of nicotine dependence: Secondary | ICD-10-CM | POA: Diagnosis not present

## 2014-08-29 DIAGNOSIS — G8929 Other chronic pain: Secondary | ICD-10-CM | POA: Diagnosis not present

## 2014-08-29 DIAGNOSIS — F3130 Bipolar disorder, current episode depressed, mild or moderate severity, unspecified: Secondary | ICD-10-CM

## 2014-08-29 DIAGNOSIS — F331 Major depressive disorder, recurrent, moderate: Secondary | ICD-10-CM | POA: Diagnosis not present

## 2014-08-29 DIAGNOSIS — M25519 Pain in unspecified shoulder: Secondary | ICD-10-CM | POA: Diagnosis not present

## 2014-08-29 DIAGNOSIS — F1310 Sedative, hypnotic or anxiolytic abuse, uncomplicated: Secondary | ICD-10-CM | POA: Diagnosis not present

## 2014-08-29 DIAGNOSIS — F329 Major depressive disorder, single episode, unspecified: Secondary | ICD-10-CM | POA: Diagnosis present

## 2014-08-29 DIAGNOSIS — I10 Essential (primary) hypertension: Secondary | ICD-10-CM | POA: Diagnosis not present

## 2014-08-29 HISTORY — DX: Other psychoactive substance abuse, uncomplicated: F19.10

## 2014-08-29 LAB — URINE DRUG SCREEN, QUALITATIVE (ARMC ONLY)
Amphetamines, Ur Screen: NEGATIVE — AB
Barbiturates, Ur Screen: NEGATIVE — AB
Benzodiazepine, Ur Scrn: NEGATIVE — AB
COCAINE METABOLITE, UR ~~LOC~~: POSITIVE — AB
Cannabinoid 50 Ng, Ur ~~LOC~~: NEGATIVE — AB
MDMA (ECSTASY) UR SCREEN: NEGATIVE — AB
Methadone Scn, Ur: NEGATIVE — AB
Opiate, Ur Screen: NEGATIVE — AB
Phencyclidine (PCP) Ur S: NEGATIVE — AB
TRICYCLIC, UR SCREEN: POSITIVE — AB

## 2014-08-29 LAB — COMPREHENSIVE METABOLIC PANEL
ALT: 37 U/L (ref 14–54)
AST: 37 U/L (ref 15–41)
Albumin: 4.3 g/dL (ref 3.5–5.0)
Alkaline Phosphatase: 81 U/L (ref 38–126)
Anion gap: 9 (ref 5–15)
BUN: 10 mg/dL (ref 6–20)
CO2: 23 mmol/L (ref 22–32)
CREATININE: 1.03 mg/dL — AB (ref 0.44–1.00)
Calcium: 8.9 mg/dL (ref 8.9–10.3)
Chloride: 106 mmol/L (ref 101–111)
GFR calc non Af Amer: >60 mL/min (ref >60)
GLUCOSE: 177 mg/dL — AB (ref 65–99)
Potassium: 3.7 mmol/L (ref 3.5–5.1)
SODIUM: 138 mmol/L (ref 135–145)
Total Bilirubin: 0.5 mg/dL (ref 0.3–1.2)
Total Protein: 7.5 g/dL (ref 6.5–8.1)

## 2014-08-29 LAB — CBC
HEMATOCRIT: 43.2 % (ref 35.0–47.0)
Hemoglobin: 14.6 g/dL (ref 12.0–16.0)
MCH: 31.2 pg (ref 26.0–34.0)
MCHC: 33.7 g/dL (ref 32.0–36.0)
MCV: 92.6 fL (ref 80.0–100.0)
Platelets: 246 10*3/uL (ref 150–440)
RBC: 4.67 MIL/uL (ref 3.80–5.20)
RDW: 13.4 % (ref 11.5–14.5)
WBC: 5.2 10*3/uL (ref 3.6–11.0)

## 2014-08-29 LAB — ETHANOL: Alcohol, Ethyl (B): 7 mg/dL — ABNORMAL HIGH (ref <5)

## 2014-08-29 LAB — ACETAMINOPHEN LEVEL: Acetaminophen (Tylenol), Serum: <10 ug/mL — ABNORMAL LOW (ref 10–30)

## 2014-08-29 LAB — SALICYLATE LEVEL

## 2014-08-29 MED ORDER — IBUPROFEN 800 MG PO TABS
800.0000 mg | ORAL_TABLET | ORAL | Status: AC
  Administered 2014-08-29: 800 mg via ORAL
  Filled 2014-08-29: qty 1

## 2014-08-29 MED ORDER — METFORMIN HCL 500 MG PO TABS
500.0000 mg | ORAL_TABLET | Freq: Two times a day (BID) | ORAL | Status: DC
  Administered 2014-08-30: 500 mg via ORAL
  Filled 2014-08-29 (×2): qty 1

## 2014-08-29 MED ORDER — GABAPENTIN 300 MG PO CAPS
600.0000 mg | ORAL_CAPSULE | ORAL | Status: AC
  Administered 2014-08-29: 600 mg via ORAL
  Filled 2014-08-29: qty 2

## 2014-08-29 MED ORDER — LISINOPRIL 10 MG PO TABS
10.0000 mg | ORAL_TABLET | Freq: Every day | ORAL | Status: DC
  Administered 2014-08-30: 10 mg via ORAL
  Filled 2014-08-29: qty 1

## 2014-08-29 MED ORDER — AMLODIPINE BESYLATE 5 MG PO TABS
5.0000 mg | ORAL_TABLET | Freq: Once | ORAL | Status: AC
  Administered 2014-08-29: 5 mg via ORAL
  Filled 2014-08-29: qty 1

## 2014-08-29 MED ORDER — CLONAZEPAM 0.5 MG PO TABS
0.5000 mg | ORAL_TABLET | ORAL | Status: AC
  Administered 2014-08-29: 0.5 mg via ORAL
  Filled 2014-08-29: qty 1

## 2014-08-29 MED ORDER — IBUPROFEN 800 MG PO TABS
800.0000 mg | ORAL_TABLET | Freq: Three times a day (TID) | ORAL | Status: DC | PRN
  Administered 2014-08-29 – 2014-08-30 (×2): 800 mg via ORAL
  Filled 2014-08-29 (×3): qty 1

## 2014-08-29 MED ORDER — QUETIAPINE FUMARATE 200 MG PO TABS
400.0000 mg | ORAL_TABLET | Freq: Every day | ORAL | Status: DC
  Administered 2014-08-29: 400 mg via ORAL
  Filled 2014-08-29: qty 2

## 2014-08-29 MED ORDER — NICOTINE 10 MG IN INHA
1.0000 | RESPIRATORY_TRACT | Status: DC | PRN
  Administered 2014-08-29: 1 via RESPIRATORY_TRACT
  Filled 2014-08-29: qty 36

## 2014-08-29 MED ORDER — QUETIAPINE FUMARATE 25 MG PO TABS
100.0000 mg | ORAL_TABLET | Freq: Every morning | ORAL | Status: DC
  Administered 2014-08-30: 100 mg via ORAL
  Filled 2014-08-29: qty 4

## 2014-08-29 MED ORDER — ESCITALOPRAM OXALATE 10 MG PO TABS
20.0000 mg | ORAL_TABLET | Freq: Every day | ORAL | Status: DC
  Administered 2014-08-29 – 2014-08-30 (×2): 20 mg via ORAL
  Filled 2014-08-29: qty 2
  Filled 2014-08-29: qty 1
  Filled 2014-08-29: qty 2
  Filled 2014-08-29: qty 1

## 2014-08-29 MED ORDER — GABAPENTIN 300 MG PO CAPS
800.0000 mg | ORAL_CAPSULE | Freq: Three times a day (TID) | ORAL | Status: DC | PRN
  Administered 2014-08-29 – 2014-08-30 (×2): 800 mg via ORAL
  Filled 2014-08-29 (×3): qty 2

## 2014-08-29 MED ORDER — LISINOPRIL 10 MG PO TABS
10.0000 mg | ORAL_TABLET | Freq: Once | ORAL | Status: AC
  Administered 2014-08-29: 10 mg via ORAL
  Filled 2014-08-29: qty 1

## 2014-08-29 NOTE — ED Notes (Signed)
BEHAVIORAL HEALTH ROUNDING Patient sleeping: No. Patient alert and oriented: yes Behavior appropriate: Yes.  ; If no, describe:  Nutrition and fluids offered: Yes  Toileting and hygiene offered: Yes  Sitter present: not applicable Law enforcement present: Yes  

## 2014-08-29 NOTE — ED Notes (Signed)

## 2014-08-29 NOTE — ED Notes (Signed)
BEHAVIORAL HEALTH ROUNDING Patient sleeping: Yes.   Patient alert and oriented: yes Behavior appropriate: Yes.  ; If no, describe:  Nutrition and fluids offered: Yes  Toileting and hygiene offered: Yes  Sitter present: not applicable Law enforcement present: Yes  
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
## 2014-08-29 NOTE — ED Notes (Signed)
Patient assigned to appropriate care area. Patient oriented to unit/care area: Informed that, for their safety, care areas are designed for safety and monitored by staff at all times; and visiting hours explained to patient. Patient verbalizes understanding, and verbal contract for safety obtained. 

## 2014-08-29 NOTE — ED Notes (Signed)
Hand-off report given to [place], Therapist, sports.
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
## 2014-08-29 NOTE — Consult Note (Signed)
[facility] Consult   Reason for Consult:  Follow up Referring Physician:  ER Patient Identification: Victoria Frey MRN:  [id number] Principal Diagnosis: <principal problem not specified> Diagnosis:  There are no active problems to display for this patient.   Total Time spent with patient: 45 minutes  Subjective:   Victoria Frey is a 54 y.o. female patient admitted with a long H/O Poly substance abuse ie alcohol and cocaine. Last worked in 2007 and widowed and lives with boy friend and getting depressed with SI about her drinking and and for help.  HPI:  Being followed on out pt basis by MH and is on meds for the same HPI Elements:     Past Medical History:  Past Medical History  Diagnosis Date  . Diabetes mellitus without complication   . Hypertension   . Chronic pain   . Depression   . Bipolar 1 disorder   . Drug abuse     Past Surgical History  Procedure Laterality Date  . Back surgery    . Skin cancer excision    . Carpal tunnel release    . Tumor removal     Family History: History reviewed. No pertinent family history. Social History:  History  Alcohol Use No     History  Drug Use  . Yes  . Special: Cocaine    Social History   Social History  . Marital Status: Widowed    Spouse Name: N/A  . Number of Children: N/A  . Years of Education: N/A   Social History Main Topics  . Smoking status: Former Research scientist (life sciences)  . Smokeless tobacco: Never Used  . Alcohol Use: No  . Drug Use: Yes    Special: Cocaine  . Sexual Activity: Yes    Birth Control/ Protection: None   Other Topics Concern  . None   Social History Narrative   Additional Social History:    Pain Medications: None Reported Prescriptions: None Reported Over the Counter: None Reported History of alcohol / drug use?: Yes Longest period of sobriety (when/how long): None Negative Consequences of Use: Financial, Personal relationships Name of Substance 1: Crack Cocaine 1 - Age of  First Use: 44 years 1 - Amount (size/oz): $40-  $500 worth 1 - Frequency: Daily 1 - Duration: 10 years 1 - Last Use / Amount: 08/28/14                   Allergies:  No Known Allergies  Labs:  Results for orders placed or performed during the hospital encounter of 08/29/14 (from the past 48 hour(s))  Comprehensive metabolic panel     Status: Abnormal   Collection Time: 08/29/14 10:02 AM  Result Value Ref Range   Sodium 138 135 - 145 mmol/L   Potassium 3.7 3.5 - 5.1 mmol/L   Chloride 106 101 - 111 mmol/L   CO2 23 22 - 32 mmol/L   Glucose, Bld 177 (H) 65 - 99 mg/dL   BUN 10 6 - 20 mg/dL   Creatinine, Ser 1.03 (H) 0.44 - 1.00 mg/dL   Calcium 8.9 8.9 - 10.3 mg/dL   Total Protein 7.5 6.5 - 8.1 g/dL   Albumin 4.3 3.5 - 5.0 g/dL   AST 37 15 - 41 U/L   ALT 37 14 - 54 U/L   Alkaline Phosphatase 81 38 - 126 U/L   Total Bilirubin 0.5 0.3 - 1.2 mg/dL   GFR calc non Af Amer >60 >60 mL/min   GFR  calc Af Amer >60 >60 mL/min   Anion gap 9 5 - 15  Ethanol (ETOH)     Status: Abnormal   Collection Time: 08/29/14 10:02 AM  Result Value Ref Range   Alcohol, Ethyl (B) 7 (H) <5 mg/dL    Comment:        LOWEST DETECTABLE LIMIT FOR SERUM ALCOHOL IS 5 mg/dL FOR MEDICAL PURPOSES ONLY   Salicylate level     Status: None   Collection Time: 08/29/14 10:02 AM  Result Value Ref Range   Salicylate Lvl <1.6 2.8 - 30.0 mg/dL  Acetaminophen level     Status: Abnormal   Collection Time: 08/29/14 10:02 AM  Result Value Ref Range   Acetaminophen (Tylenol), Serum <10 (L) 10 - 30 ug/mL    Comment:        THERAPEUTIC CONCENTRATIONS VARY SIGNIFICANTLY. A RANGE OF 10-30 ug/mL MAY BE AN EFFECTIVE CONCENTRATION FOR MANY PATIENTS. HOWEVER, SOME ARE BEST TREATED AT CONCENTRATIONS OUTSIDE THIS RANGE. ACETAMINOPHEN CONCENTRATIONS >150 ug/mL AT 4 HOURS AFTER INGESTION AND >50 ug/mL AT 12 HOURS AFTER INGESTION ARE OFTEN ASSOCIATED WITH TOXIC REACTIONS.   CBC     Status: None   Collection Time:  08/29/14 10:02 AM  Result Value Ref Range   WBC 5.2 3.6 - 11.0 K/uL   RBC 4.67 3.80 - 5.20 MIL/uL   Hemoglobin 14.6 12.0 - 16.0 g/dL   HCT 43.2 35.0 - 47.0 %   MCV 92.6 80.0 - 100.0 fL   MCH 31.2 26.0 - 34.0 pg   MCHC 33.7 32.0 - 36.0 g/dL   RDW 13.4 11.5 - 14.5 %   Platelets 246 150 - 440 K/uL  Urine Drug Screen, Qualitative ([facility] only)     Status: Abnormal   Collection Time: 08/29/14 10:02 AM  Result Value Ref Range   Tricyclic, Ur Screen POSITIVE (A) NONE DETECTED   Amphetamines, Ur Screen NEGATIVE (A) NONE DETECTED   MDMA (Ecstasy)Ur Screen NEGATIVE (A) NONE DETECTED   Cocaine Metabolite,Ur [place] POSITIVE (A) NONE DETECTED   Opiate, Ur Screen NEGATIVE (A) NONE DETECTED   Phencyclidine (PCP) Ur S NEGATIVE (A) NONE DETECTED   Cannabinoid 50 Ng, Ur [place] NEGATIVE (A) NONE DETECTED   Barbiturates, Ur Screen NEGATIVE (A) NONE DETECTED   Benzodiazepine, Ur Scrn NEGATIVE (A) NONE DETECTED   Methadone Scn, Ur NEGATIVE (A) NONE DETECTED    Comment: (NOTE) 109  Tricyclics, urine               Cutoff 1000 ng/mL 200  Amphetamines, urine             Cutoff 1000 ng/mL 300  MDMA (Ecstasy), urine           Cutoff 500 ng/mL 400  Cocaine Metabolite, urine       Cutoff 300 ng/mL 500  Opiate, urine                   Cutoff 300 ng/mL 600  Phencyclidine (PCP), urine      Cutoff 25 ng/mL 700  Cannabinoid, urine              Cutoff 50 ng/mL 800  Barbiturates, urine             Cutoff 200 ng/mL 900  Benzodiazepine, urine           Cutoff 200 ng/mL 1000 Methadone, urine                Cutoff 300 ng/mL 1100 1200 The urine  drug screen provides only a preliminary, unconfirmed 1300 analytical test result and should not be used for non-medical 1400 purposes. Clinical consideration and professional judgment should 1500 be applied to any positive drug screen result due to possible 1600 interfering substances. A more specific alternate chemical method 1700 must be used in order to obtain a confirmed analytical  result.  1800 Gas chromato graphy / mass spectrometry (GC/MS) is the preferred 1900 confirmatory method.     Vitals: Blood pressure 129/87, pulse 93, temperature 98.2 F (36.8 C), temperature source Oral, resp. rate 20, height 5\' 7"  (1.702 m), weight 101.606 kg (224 lb), SpO2 95 %.  Risk to Self: Suicidal Ideation: No Suicidal Intent: No Is patient at risk for suicide?: No Suicidal Plan?: No Access to Means: No What has been your use of drugs/alcohol within the last 12 months?: Daily Crack Cocaine Use, $40- $500 a day for the past 10 years How many times?: 0 Other Self Harm Risks: No Triggers for Past Attempts: Hallucinations Intentional Self Injurious Behavior: None Risk to Others: Homicidal Ideation: No Thoughts of Harm to Others: No Current Homicidal Intent: No Current Homicidal Plan: No Access to Homicidal Means: No Identified Victim: n/a History of harm to others?: No Assessment of Violence: None Noted Violent Behavior Description: n/a Does patient have access to weapons?: No Criminal Charges Pending?: No Does patient have a court date: No Prior Inpatient Therapy: Prior Inpatient Therapy: Yes Prior Therapy Dates: 2001 Prior Therapy Facilty/Provider(s): [facility] Reason for Treatment: Depression/ Alcohol Abuse Prior Outpatient Therapy: Prior Outpatient Therapy: No Prior Therapy Dates: n/a Prior Therapy Facilty/Provider(s): n./a Reason for Treatment: n/a Does patient have an ACCT team?: No Does patient have Intensive In-House Services?  : No Does patient have [facility] services? : No Does patient have P4[facility] services?: No  No current facility-administered medications for this encounter.   Current Outpatient Prescriptions  Medication Sig Dispense Refill  . HYDROcodone-acetaminophen (NORCO/VICODIN) 5-325 MG per tablet Take 1 tablet by mouth every 4 (four) hours as needed for moderate pain. 12 tablet 0  . lip balm (CARMEX) ointment Apply 1 application topically as needed  for lip care. 7 g 0  . meloxicam (MOBIC) 15 MG tablet Take 1 tablet (15 mg total) by mouth daily as needed for pain. 10 tablet 0  . traMADol (ULTRAM) 50 MG tablet Take 1 tablet (50 mg total) by mouth every 8 (eight) hours as needed (Do not drive or operate machinery while taking as can cause drowsiness.). 12 tablet 0    Musculoskeletal: Strength & Muscle Tone: within normal limits Gait & Station: normal Patient leans: N/A  Psychiatric Specialty Exam: Physical Exam  Nursing note and vitals reviewed.   ROS  Blood pressure 129/87, pulse 93, temperature 98.2 F (36.8 C), temperature source Oral, resp. rate 20, height 5\' 7"  (1.702 m), weight 101.606 kg (224 lb), SpO2 95 %.Body mass index is 35.08 kg/(m^2).  General Appearance: Casual  Eye Contact::  Fair  Speech:  Clear and Coherent  Volume:  Normal  Mood:  Anxious, Depressed and fair  Affect:  Appropriate  Thought Process:  Circumstantial  Orientation:  Full (Time, Place, and Person)  Thought Content:  Negative  Suicidal Thoughts:  No  Homicidal Thoughts:  No  Memory:  Immediate;   Fair Recent;   Fair Remote;   Fair adequate  Judgement:  Fair  Insight:  Fair  Psychomotor Activity:  Normal  Concentration:  Fair  Recall:  [employer] [place]  Language: Fair  Akathisia:  No  Handed:  Right  AIMS (if indicated):     Assets:  Communication Skills Desire for Improvement Housing Resilience  ADL's:  Intact  Cognition: WNL  Sleep:      Medical Decision Making: Established Problem, Stable/Improving (1)  Treatment Plan Summary: Plan Start pt back on her meds. Refer pt to Substance abuse program as she is eager to get help for the same.  Plan:  No evidence of imminent risk to self or others at present.   Disposition: as above  Baeleigh Devincent K 08/29/2014 4:40 PM

## 2014-08-30 DIAGNOSIS — F3130 Bipolar disorder, current episode depressed, mild or moderate severity, unspecified: Secondary | ICD-10-CM

## 2014-08-30 DIAGNOSIS — F3132 Bipolar disorder, current episode depressed, moderate: Secondary | ICD-10-CM

## 2014-08-30 DIAGNOSIS — F1410 Cocaine abuse, uncomplicated: Secondary | ICD-10-CM
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
## 2014-08-30 NOTE — ED Notes (Signed)
Breakfast provided  Pt observed with no unusual behavior  Appropriate to stimulation  No verbalized needs or concerns at this time  NAD assessed  Continue to monitor 

## 2014-08-30 NOTE — ED Notes (Signed)
ED BHU PLACEMENT JUSTIFICATION Is the patient under IVC or is there intent for IVC: no    Is the patient medically cleared: Yes.   Is there vacancy in the ED BHU: Yes.   Is the population mix appropriate for patient: Yes.   Is the patient awaiting placement in inpatient or outpatient setting: No. Has the patient had a psychiatric consult:  Consult pending Survey of unit performed for contraband, proper placement and condition of furniture, tampering with fixtures in bathroom, shower, and each patient room: Yes.  ; Findings:  APPEARANCE/BEHAVIOR Calm and cooperative NEURO ASSESSMENT Orientation: oriented x3  Denies pain Hallucinations: No.None noted (Hallucinations) Speech: Normal Gait: normal RESPIRATORY ASSESSMENT even unlabored respirations  CARDIOVASCULAR ASSESSMENT Pulses equal  regular rate  Skin warm and dry GASTROINTESTINAL ASSESSMENT no GI complaint EXTREMITIES Full ROM PLAN OF CARE Provide calm/safe environment. Vital signs assessed twice daily. ED BHU Assessment once each 12-hour shift. Collaborate with intake RN daily or as condition indicates. Assure the ED provider has rounded once each shift. Provide and encourage hygiene. Provide redirection as needed. Assess for escalating behavior; address immediately and inform ED provider.  Assess family dynamic and appropriateness for visitation as needed: Yes.  ; If necessary, describe findings:  Educate the patient/family about BHU procedures/visitation: Yes.  ; If necessary, describe findings:   

## 2014-08-30 NOTE — ED Notes (Signed)
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
## 2014-08-30 NOTE — ED Notes (Signed)
md Clapacs has consulted with her and she will be discharged to home along with information about the [facility]

## 2014-08-30 NOTE — ED Notes (Signed)
ED BHU [place] Is the patient under IVC or is there intent for IVC: No. Is the patient medically cleared: Yes.   Is there vacancy in the ED BHU: Yes.   Is the population mix appropriate for patient: Yes.   Is the patient awaiting placement in inpatient or outpatient setting: yes substance abuse program Has the patient had a psychiatric consult: Yes.   Survey of unit performed for contraband, proper placement and condition of furniture, tampering with fixtures in bathroom, shower, and each patient room: Yes.  ; Findings:  APPEARANCE/BEHAVIOR calm and cooperative NEURO ASSESSMENT Orientation: time person place Hallucinations: No.None noted (Hallucinations) Speech: Normal Gait: normal RESPIRATORY ASSESSMENT Normal expansion.  Clear to auscultation.  No rales, rhonchi, or wheezing. CARDIOVASCULAR ASSESSMENT regular rate and rhythm, S1, S2 normal, no murmur, click, rub or gallop GASTROINTESTINAL ASSESSMENT soft, nontender, BS WNL, no r/g EXTREMITIES normal strength, tone, and muscle mass PLAN OF CARE Provide calm/safe environment. Vital signs assessed twice daily. ED BHU Assessment once each 12-hour shift. Collaborate with intake RN daily or as condition indicates. Assure the ED provider has rounded once each shift. Provide and encourage hygiene. Provide redirection as needed. Assess for escalating behavior; address immediately and inform ED provider.   Educate the patient/family about BHU procedures/visitation: Yes.  ; If necessary, describe findings:

## 2014-08-30 NOTE — ED Notes (Signed)
BEHAVIORAL HEALTH ROUNDING Patient sleeping: No. Patient alert and oriented: yes Behavior appropriate: Yes.  ; If no, describe:  Nutrition and fluids offered: yes Toileting and hygiene offered: Yes  Sitter present: q15 minute observations and security camera monitoring Law enforcement present: Yes  ODS  

## 2014-08-30 NOTE — ED Notes (Signed)
Pt observed with no unusual behavior  Appropriate to stimulation  No verbalized needs or concerns at this time  NAD assessed  Continue to monitor 

## 2014-08-30 NOTE — ED Notes (Signed)
Report received from [name]. Pt. Alert and oriented in no distress denies SI, HI, AVH and pain.  Pt. Instructed to come to me with problems or concerns.Will continue to monitor for safety via security cameras and Q 15 minute checks.

## 2014-08-30 NOTE — ED Notes (Signed)
Patient assigned to appropriate care area. Patient oriented to unit/care area: Informed that, for their safety, care areas are designed for safety and monitored by security cameras at all times; and visiting hours explained to patient. Patient verbalizes understanding, and verbal contract for safety obtained. 

## 2014-08-30 NOTE — ED Notes (Signed)
BEHAVIORAL HEALTH ROUNDING Patient sleeping: Yes.   Patient alert and oriented: sleeping Behavior appropriate: Yes.  ; If no, describe:  Nutrition and fluids offered: sleeping Toileting and hygiene offered: sleeping Sitter present: yes Law enforcement present: Yes  Pt. Noted in room. No complaints or concerns voiced. No distress or abnormal behavior noted. Will continue to monitor with security cameras. Q 15 minute rounds continue

## 2014-08-30 NOTE — ED Notes (Signed)

## 2014-08-30 NOTE — ED Notes (Signed)
BEHAVIORAL HEALTH ROUNDING Patient sleeping: no Patient alert and oriented: yes Behavior appropriate: Yes.  ; If no, describe:  Nutrition and fluids offered: Yes  Toileting and hygiene offered: Yes  Sitter present: yes Law enforcement present: Yes  Pt. Noted in the dayroom sitting in the chair. No complaints or concerns voiced. No distress or abnormal behavior noted. Will continue to monitor with security cameras. Q 15 minute rounds continue

## 2014-08-30 NOTE — ED Notes (Signed)
Supper provided along with an extra drink  Pt observed with no unusual behavior  Appropriate to stimulation  No verbalized needs or concerns at this time  NAD assessed  Continue to monitor 

## 2014-08-30 NOTE — ED Notes (Signed)
Lunch provided along with an extra drink  Pt observed with no unusual behavior  Appropriate to stimulation  No verbalized needs or concerns at this time  NAD assessed  Continue to monitor 

## 2014-08-30 NOTE — ED Notes (Signed)
Patient observed lying in bed with eyes closed  Even, unlabored respirations observed   NAD pt appears to be sleeping  I will continue to monitor along with every 15 minute visual observations and ongoing security camera monitoring    

## 2014-08-30 NOTE — Discharge Instructions (Signed)
Please seek medical attention and help for any thoughts about wanting to harm herself, harm others, any concerning change in behavior, severe depression, inappropriate drug use or any other new or concerning symptoms.  Major Depressive Disorder Major depressive disorder is a mental illness. It also may be called clinical depression or unipolar depression. Major depressive disorder usually causes feelings of sadness, hopelessness, or helplessness. Some people with this disorder do not feel particularly sad but lose interest in doing things they used to enjoy (anhedonia). Major depressive disorder also can cause physical symptoms. It can interfere with work, school, relationships, and other normal everyday activities. The disorder varies in severity but is longer lasting and more serious than the sadness we all feel from time to time in our lives. Major depressive disorder often is triggered by stressful life events or major life changes. Examples of these triggers include divorce, loss of your job or home, a move, and the death of a family member or close friend. Sometimes this disorder occurs for no obvious reason at all. People who have family members with major depressive disorder or bipolar disorder are at higher risk for developing this disorder, with or without life stressors. Major depressive disorder can occur at any age. It may occur just once in your life (single episode major depressive disorder). It may occur multiple times (recurrent major depressive disorder). SYMPTOMS People with major depressive disorder have either anhedonia or depressed mood on nearly a daily basis for at least 2 weeks or longer. Symptoms of depressed mood include:  Feelings of sadness (blue or down in the dumps) or emptiness.  Feelings of hopelessness or helplessness.  Tearfulness or episodes of crying (may be observed by others).  Irritability (children and adolescents). In addition to depressed mood or anhedonia or  both, people with this disorder have at least four of the following symptoms:  Difficulty sleeping or sleeping too much.   Significant change (increase or decrease) in appetite or weight.   Lack of energy or motivation.  Feelings of guilt and worthlessness.   Difficulty concentrating, remembering, or making decisions.  Unusually slow movement (psychomotor retardation) or restlessness (as observed by others).   Recurrent wishes for death, recurrent thoughts of self-harm (suicide), or a suicide attempt. People with major depressive disorder commonly have persistent negative thoughts about themselves, other people, and the world. People with severe major depressive disorder may experiencedistorted beliefs or perceptions about the world (psychotic delusions). They also may see or hear things that are not real (psychotic hallucinations). DIAGNOSIS Major depressive disorder is diagnosed through an assessment by your health care provider. Your health care provider will ask aboutaspects of your daily life, such as mood,sleep, and appetite, to see if you have the diagnostic symptoms of major depressive disorder. Your health care provider may ask about your medical history and use of alcohol or drugs, including prescription medicines. Your health care provider also may do a physical exam and blood work. This is because certain medical conditions and the use of certain substances can cause major depressive disorder-like symptoms (secondary depression). Your health care provider also may refer you to a mental health specialist for further evaluation and treatment. TREATMENT It is important to recognize the symptoms of major depressive disorder and seek treatment. The following treatments can be prescribed for this disorder:   Medicine. Antidepressant medicines usually are prescribed. Antidepressant medicines are thought to correct chemical imbalances in the brain that are commonly associated with  major depressive disorder. Other types of medicine may  be added if the symptoms do not respond to antidepressant medicines alone or if psychotic delusions or hallucinations occur.  Talk therapy. Talk therapy can be helpful in treating major depressive disorder by providing support, education, and guidance. Certain types of talk therapy also can help with negative thinking (cognitive behavioral therapy) and with relationship issues that trigger this disorder (interpersonal therapy). A mental health specialist can help determine which treatment is best for you. Most people with major depressive disorder do well with a combination of medicine and talk therapy. Treatments involving electrical stimulation of the brain can be used in situations with extremely severe symptoms or when medicine and talk therapy do not work over time. These treatments include electroconvulsive therapy, transcranial magnetic stimulation, and vagal nerve stimulation. Document Released: 04/26/2012 Document Revised: 05/16/2013 Document Reviewed: 04/26/2012 [facility] Patient Information 2015 [place], [place]. This information is not intended to replace advice given to you by your health care provider. Make sure you discuss any questions you have with your health care provider.

## 2014-08-30 NOTE — ED Notes (Signed)
Pt. To BHU from ED ambulatory without difficulty, to room ____6_____ . Report from __Luis________ RN. Pt. Is alert and oriented, warm and dry in no distress. Pt. ____denies______  SI, HI,. Pt. Calm and cooperative. Pt. Made aware of security cameras and Q15 minute rounds. Pt. Encouraged to let Nursing staff know of any concerns or needs.

## 2014-08-30 NOTE — ED Notes (Signed)
Am meds administered as ordered  Assessment completed   

## 2014-08-30 NOTE — ED Notes (Signed)
BEHAVIORAL HEALTH ROUNDING Patient sleeping: yes Patient alert and oriented: yes Behavior appropriate: Yes.  ; If no, describe:  Nutrition and fluids offered: Yes  Toileting and hygiene offered: Yes  Sitter present: yes Law enforcement present: Yes  Pt. Noted in room. No complaints or concerns voiced. No distress or abnormal behavior noted. Will continue to monitor with security cameras. Q 15 minute rounds continue

## 2014-08-30 NOTE — ED Notes (Signed)
BEHAVIORAL HEALTH ROUNDING Patient sleeping: No. Patient alert and oriented: yes Behavior appropriate: Yes.  ; If no, describe:  Nutrition and fluids offered: Yes  Toileting and hygiene offered: Yes  Sitter present: yes Law enforcement present: Yes  

## 2014-08-30 NOTE — ED Notes (Signed)
BEHAVIORAL HEALTH ROUNDING Patient sleeping: Yes.   Patient alert and oriented: sleeping Behavior appropriate: Yes.  ; If no, describe:  Nutrition and fluids offered: sleeping Toileting and hygiene offered: sleeping Sitter present: yes Law enforcement present: Yes

## 2014-08-30 NOTE — ED Notes (Signed)
Pt. Noted in room. No complaints or concerns voiced. No distress or abnormal behavior noted. Will continue to monitor with security cameras. Q 15 minute rounds continue. 

## 2014-09-17 ENCOUNTER — Encounter: Payer: Self-pay | Admitting: Emergency Medicine

## 2014-09-17 ENCOUNTER — Emergency Department
Admission: EM | Admit: 2014-09-17 | Discharge: 2014-09-17 | Disposition: A | Discharge status: Home / Self Care | Payer: Medicare Other | Attending: Emergency Medicine | Admitting: Emergency Medicine

## 2014-09-17 DIAGNOSIS — E119 Type 2 diabetes mellitus without complications: Secondary | ICD-10-CM | POA: Insufficient documentation

## 2014-09-17 DIAGNOSIS — S61214A Laceration without foreign body of right ring finger without damage to nail, initial encounter: Secondary | ICD-10-CM | POA: Diagnosis not present

## 2014-09-17 DIAGNOSIS — S61219A Laceration without foreign body of unspecified finger without damage to nail, initial encounter: Secondary | ICD-10-CM

## 2014-09-17 DIAGNOSIS — Z720 Tobacco use: Secondary | ICD-10-CM | POA: Insufficient documentation

## 2014-09-17 DIAGNOSIS — Y998 Other external cause status: Secondary | ICD-10-CM | POA: Diagnosis not present

## 2014-09-17 DIAGNOSIS — Y9389 Activity, other specified: Secondary | ICD-10-CM | POA: Diagnosis not present

## 2014-09-17 DIAGNOSIS — Y9289 Other specified places as the place of occurrence of the external cause: Secondary | ICD-10-CM | POA: Diagnosis not present

## 2014-09-17 DIAGNOSIS — I10 Essential (primary) hypertension: Secondary | ICD-10-CM | POA: Insufficient documentation

## 2014-09-17 DIAGNOSIS — W25XXXA Contact with sharp glass, initial encounter: Secondary | ICD-10-CM | POA: Diagnosis not present

## 2014-09-17 MED ORDER — LIDOCAINE HCL (PF) 1 % IJ SOLN
5.0000 mL | Freq: Once | INTRAMUSCULAR | Status: AC
  Administered 2014-09-17: 5 mL

## 2014-09-17 MED ORDER — BACITRACIN ZINC 500 UNIT/GM EX OINT
TOPICAL_OINTMENT | Freq: Two times a day (BID) | CUTANEOUS | Status: DC
  Administered 2014-09-17: 1 via TOPICAL
  Filled 2014-09-17: qty 0.9

## 2014-09-17 MED ORDER — HYDROCODONE-ACETAMINOPHEN 5-325 MG PO TABS
1.0000 | ORAL_TABLET | Freq: Once | ORAL | Status: AC
  Administered 2014-09-17: 1 via ORAL
  Filled 2014-09-17: qty 1

## 2014-09-17 MED ORDER — LIDOCAINE HCL (PF) 1 % IJ SOLN
INTRAMUSCULAR | Status: AC
  Administered 2014-09-17: 5 mL
  Filled 2014-09-17: qty 5

## 2014-09-17 NOTE — ED Notes (Signed)
Pt lacerated rt 4th digit, dressing applied, bleeding controlled.Marland Kitchen

## 2014-09-17 NOTE — ED Notes (Signed)
Bacitracin applied to area and bandaged.

## 2014-09-17 NOTE — ED Notes (Signed)
Picture frame glass , lac to right 4th digit ,

## 2014-09-17 NOTE — ED Provider Notes (Signed)
[facility] Emergency Department Provider Note ____________________________________________  Time seen: 1442  I have reviewed the triage vital signs and the nursing notes.  HISTORY  Chief Complaint  Extremity Laceration  HPI Victoria Frey is a 54 y.o. female reports to the ED for evaluation and treatment of a lacerationto the ring finger of the fourth hand. She describes a laceration to the fat pad of the fingertip while handling a picture frame glass. She denies any other injury at this time. She does report a current tetanus status in the last 4 years.  Past Medical History  Diagnosis Date  . Diabetes mellitus without complication   . Hypertension   . Chronic pain   . Depression   . Bipolar 1 disorder   . Drug abuse     Patient Active Problem List   Diagnosis Date Noted  . Bipolar disorder, now depressed 08/30/2014  . Cocaine abuse 08/30/2014    Past Surgical History  Procedure Laterality Date  . Back surgery    . Skin cancer excision    . Carpal tunnel release    . Tumor removal      Current Outpatient Rx  Name  Route  Sig  Dispense  Refill  . HYDROcodone-acetaminophen (NORCO/VICODIN) 5-325 MG per tablet   Oral   Take 1 tablet by mouth every 4 (four) hours as needed for moderate pain.   12 tablet   0   . lip balm (CARMEX) ointment   Topical   Apply 1 application topically as needed for lip care.   7 g   0   . meloxicam (MOBIC) 15 MG tablet   Oral   Take 1 tablet (15 mg total) by mouth daily as needed for pain.   10 tablet   0   . traMADol (ULTRAM) 50 MG tablet   Oral   Take 1 tablet (50 mg total) by mouth every 8 (eight) hours as needed (Do not drive or operate machinery while taking as can cause drowsiness.).   12 tablet   0    Allergies Review of patient's allergies indicates no known allergies.  No family history on file.  Social History Social History  Substance Use Topics  . Smoking status: Current Every Day  Smoker  . Smokeless tobacco: Never Used  . Alcohol Use: No   Review of Systems  Constitutional: Negative for fever. Eyes: Negative for visual changes. ENT: Negative for sore throat. Cardiovascular: Negative for chest pain. Respiratory: Negative for shortness of breath. Gastrointestinal: Negative for abdominal pain, vomiting and diarrhea. Genitourinary: Negative for dysuria. Musculoskeletal: Negative for back pain. Skin: Negative for rash. Finger laceration as above Neurological: Negative for headaches, focal weakness or numbness. ____________________________________________  PHYSICAL EXAM:  VITAL SIGNS: ED Triage Vitals  Enc Vitals Group     BP 09/17/14 1443 186/105 mmHg     Pulse Rate 09/17/14 1443 92     Resp 09/17/14 1443 22     Temp 09/17/14 1443 97.8 F (36.6 C)     Temp Source 09/17/14 1443 Oral     SpO2 09/17/14 1443 99 %     Weight 09/17/14 1441 218 lb (98.884 kg)     Height 09/17/14 1441 5\' 7"  (1.702 m)     Head Cir --      Peak Flow --      Pain Score 09/17/14 1441 10     Pain Loc --      Pain Edu? --  Excl. in [place]? --    Constitutional: Alert and oriented. Well appearing and in no distress. Eyes: Conjunctivae are normal. PERRL. Normal extraocular movements. ENT   Head: Normocephalic and atraumatic.   Nose: No congestion/rhinorrhea.   Mouth/Throat: Mucous membranes are moist.   Neck: Supple. No thyromegaly. Hematological/Lymphatic/Immunological: No cervical lymphadenopathy. Cardiovascular: Normal rate, regular rhythm.  Respiratory: Normal respiratory effort. No wheezes/rales/rhonchi. Gastrointestinal: Soft and nontender. No distention. Musculoskeletal: Right ring finger with irregular flap laceration to the distal fat pad. Nontender with normal range of motion in all extremities.  Neurologic:  Normal gross sensation. Normal composite fist. Normal gait without ataxia. Normal speech and language. No gross focal neurologic deficits are  appreciated. Skin:  Skin is warm, dry and intact. No rash noted. Psychiatric: Mood and affect are normal. Patient exhibits appropriate insight and judgment. ____________________________________________  PROCEDURES  Norco 5-325 mg PO Dressing applied post procedure  LACERATION REPAIR Performed by: Melvenia Needles Authorized by: Melvenia Needles Consent: Verbal consent obtained. Risks and benefits: risks, benefits and alternatives were discussed Consent given by: patient Patient identity confirmed: provided demographic data Prepped and Draped in normal sterile fashion Wound explored  Laceration Location: right ring finger fat pad  Laceration Length: 2.5 cm flap  No Foreign Bodies seen or palpated  Anesthesia: local infiltration  Local anesthetic: lidocaine 1% w/o epinephrine  Anesthetic total: 2 ml  Irrigation method: syringe Amount of cleaning: standard  Skin closure: 5-0 nylon  Number of sutures: 7  Technique: interrupted  Patient tolerance: Patient tolerated the procedure well with no immediate complications. ____________________________________________  INITIAL IMPRESSION / ASSESSMENT AND PLAN / ED COURSE  Right ring finger laceration status post suture repair. Wound care instructions provided. Patient to return to the ED in 7-10 days for suture removal. Return as needed for wound check. ____________________________________________  FINAL CLINICAL IMPRESSION(S) / ED DIAGNOSES  Final diagnoses:  Laceration of finger of right hand, initial encounter     Melvenia Needles, PA-C 09/17/14 [place], MD 09/17/14 [phone]

## 2014-09-17 NOTE — Discharge Instructions (Signed)
Laceration Care, Adult °A laceration is a cut or lesion that goes through all layers of the skin and into the tissue just beneath the skin. °TREATMENT  °Some lacerations may not require closure. Some lacerations may not be able to be closed due to an increased risk of infection. It is important to see your caregiver as soon as possible after an injury to minimize the risk of infection and maximize the opportunity for successful closure. °If closure is appropriate, pain medicines may be given, if needed. The wound will be cleaned to help prevent infection. Your caregiver will use stitches (sutures), staples, wound glue (adhesive), or skin adhesive strips to repair the laceration. These tools bring the skin edges together to allow for faster healing and a better cosmetic outcome. However, all wounds will heal with a scar. Once the wound has healed, scarring can be minimized by covering the wound with sunscreen during the day for 1 full year. °HOME CARE INSTRUCTIONS  °For sutures or staples: °· Keep the wound clean and dry. °· If you were given a bandage (dressing), you should change it at least once a day. Also, change the dressing if it becomes wet or dirty, or as directed by your caregiver. °· Wash the wound with soap and water 2 times a day. Rinse the wound off with water to remove all soap. Pat the wound dry with a clean towel. °· After cleaning, apply a thin layer of the antibiotic ointment as recommended by your caregiver. This will help prevent infection and keep the dressing from sticking. °· You may shower as usual after the first 24 hours. Do not soak the wound in water until the sutures are removed. °· Only take over-the-counter or prescription medicines for pain, discomfort, or fever as directed by your caregiver. °· Get your sutures or staples removed as directed by your caregiver. °For skin adhesive strips: °· Keep the wound clean and dry. °· Do not get the skin adhesive strips wet. You may bathe  carefully, using caution to keep the wound dry. °· If the wound gets wet, pat it dry with a clean towel. °· Skin adhesive strips will fall off on their own. You may trim the strips as the wound heals. Do not remove skin adhesive strips that are still stuck to the wound. They will fall off in time. °For wound adhesive: °· You may briefly wet your wound in the shower or bath. Do not soak or scrub the wound. Do not swim. Avoid periods of heavy perspiration until the skin adhesive has fallen off on its own. After showering or bathing, gently pat the wound dry with a clean towel. °· Do not apply liquid medicine, cream medicine, or ointment medicine to your wound while the skin adhesive is in place. This may loosen the film before your wound is healed. °· If a dressing is placed over the wound, be careful not to apply tape directly over the skin adhesive. This may cause the adhesive to be pulled off before the wound is healed. °· Avoid prolonged exposure to sunlight or tanning lamps while the skin adhesive is in place. Exposure to ultraviolet light in the first year will darken the scar. °· The skin adhesive will usually remain in place for 5 to 10 days, then naturally fall off the skin. Do not pick at the adhesive film. °You may need a tetanus shot if: °· You cannot remember when you had your last tetanus shot. °· You have never had a tetanus   shot. If you get a tetanus shot, your arm may swell, get red, and feel warm to the touch. This is common and not a problem. If you need a tetanus shot and you choose not to have one, there is a rare chance of getting tetanus. Sickness from tetanus can be serious. SEEK MEDICAL CARE IF:   You have redness, swelling, or increasing pain in the wound.  You see a red line that goes away from the wound.  You have yellowish-white fluid (pus) coming from the wound.  You have a fever.  You notice a bad smell coming from the wound or dressing.  Your wound breaks open before or  after sutures have been removed.  You notice something coming out of the wound such as wood or glass.  Your wound is on your hand or foot and you cannot move a finger or toe. SEEK IMMEDIATE MEDICAL CARE IF:   Your pain is not controlled with prescribed medicine.  You have severe swelling around the wound causing pain and numbness or a change in color in your arm, hand, leg, or foot.  Your wound splits open and starts bleeding.  You have worsening numbness, weakness, or loss of function of any joint around or beyond the wound.  You develop painful lumps near the wound or on the skin anywhere on your body. MAKE SURE YOU:   Understand these instructions.  Will watch your condition.  Will get help right away if you are not doing well or get worse. Document Released: 12/30/2004 Document Revised: 03/24/2011 Document Reviewed: 06/25/2010 [facility] Patient Information 2015 [place], [place]. This information is not intended to replace advice given to you by your health care provider. Make sure you discuss any questions you have with your health care provider.  Keep the wound clean, dry, and covered.  Return in 7-10 days for suture removal.

## 2014-09-25 ENCOUNTER — Emergency Department
Admission: EM | Admit: 2014-09-25 | Discharge: 2014-09-25 | Disposition: A | Discharge status: Home / Self Care | Payer: Medicare Other | Attending: Emergency Medicine | Admitting: Emergency Medicine

## 2014-09-25 ENCOUNTER — Encounter: Payer: Self-pay | Admitting: Emergency Medicine

## 2014-09-25 DIAGNOSIS — Z4802 Encounter for removal of sutures: Secondary | ICD-10-CM | POA: Insufficient documentation

## 2014-09-25 DIAGNOSIS — E119 Type 2 diabetes mellitus without complications: Secondary | ICD-10-CM | POA: Diagnosis not present

## 2014-09-25 DIAGNOSIS — I10 Essential (primary) hypertension: Secondary | ICD-10-CM | POA: Insufficient documentation

## 2014-09-25 DIAGNOSIS — T814XXA Infection following a procedure, initial encounter: Secondary | ICD-10-CM | POA: Diagnosis not present

## 2014-09-25 DIAGNOSIS — Z720 Tobacco use: Secondary | ICD-10-CM | POA: Diagnosis not present

## 2014-09-25 DIAGNOSIS — Y838 Other surgical procedures as the cause of abnormal reaction of the patient, or of later complication, without mention of misadventure at the time of the procedure: Secondary | ICD-10-CM | POA: Diagnosis not present

## 2014-09-25 DIAGNOSIS — T798XXA Other early complications of trauma, initial encounter: Secondary | ICD-10-CM

## 2014-09-25 MED ORDER — SULFAMETHOXAZOLE-TRIMETHOPRIM 800-160 MG PO TABS: 1.0000 | ORAL_TABLET | Freq: Two times a day (BID) | ORAL | Status: DC

## 2014-09-25 MED ORDER — MUPIROCIN 2 % EX OINT: TOPICAL_OINTMENT | CUTANEOUS | Status: AC

## 2014-09-25 MED ORDER — BACITRACIN ZINC 500 UNIT/GM EX OINT
TOPICAL_OINTMENT | CUTANEOUS | Status: AC
  Administered 2014-09-25: 1
  Filled 2014-09-25: qty 0.9

## 2014-09-25 MED ORDER — MUPIROCIN CALCIUM 2 % EX CREA
TOPICAL_CREAM | Freq: Once | CUTANEOUS | Status: DC
  Filled 2014-09-25: qty 15

## 2014-09-25 NOTE — Discharge Instructions (Signed)
Wound Infection °A wound infection happens when a type of germ (bacteria) starts growing in the wound. In some cases, this can cause the wound to break open. If cared for properly, the infected wound will heal from the inside to the outside. Wound infections need treatment. °CAUSES °An infection is caused by bacteria growing in the wound.  °SYMPTOMS  °· Increase in redness, swelling, or pain at the wound site. °· Increase in drainage at the wound site. °· Wound or bandage (dressing) starts to smell bad. °· Fever. °· Feeling tired or fatigued. °· Pus draining from the wound. °TREATMENT  °Your health care provider will prescribe antibiotic medicine. The wound infection should improve within 24 to 48 hours. Any redness around the wound should stop spreading and the wound should be less painful.  °HOME CARE INSTRUCTIONS  °· Only take over-the-counter or prescription medicines for pain, discomfort, or fever as directed by your health care provider. °· Take your antibiotics as directed. Finish them even if you start to feel better. °· Gently wash the area with mild soap and water 2 times a day, or as directed. Rinse off the soap. Pat the area dry with a clean towel. Do not rub the wound. This may cause bleeding. °· Follow your health care provider's instructions for how often you need to change the dressing. °· Apply ointment and a dressing to the wound as directed. °· If the dressing sticks, moisten it with soapy water and gently remove it. °· Change the bandage right away if it becomes wet, dirty, or develops a bad smell. °· Take showers. Do not take tub baths, swim, or do anything that may soak the wound until it is healed. °· Avoid exercises that make you sweat heavily. °· Use anti-itch medicine as directed by your health care provider. The wound may itch when it is healing. Do not pick or scratch at the wound. °· Follow up with your health care provider to get your wound rechecked as directed. °SEEK MEDICAL CARE  IF: °· You have an increase in swelling, pain, or redness around the wound. °· You have an increase in the amount of pus coming from the wound. °· There is a bad smell coming from the wound. °· More of the wound breaks open. °· You have a fever. °MAKE SURE YOU:  °· Understand these instructions. °· Will watch your condition. °· Will get help right away if you are not doing well or get worse. °Document Released: 09/28/2002 Document Revised: 01/04/2013 Document Reviewed: 05/05/2010 °[employer]® Patient Information ©2015 [employer]. This information is not intended to replace advice given to you by your health care provider. Make sure you discuss any questions you have with your health care provider. ° °

## 2014-09-25 NOTE — ED Notes (Signed)
Here for suture removal

## 2014-09-25 NOTE — ED Provider Notes (Signed)
[facility] Emergency Department Provider Note     Time seen: ----------------------------------------- 11:02 AM on 09/25/2014 -----------------------------------------    I have reviewed the triage vital signs and the nursing notes.   HISTORY  Chief Complaint Suture / Staple Removal    HPI Victoria Frey is a 54 y.o. female who presents ER for suture removal.Patient had a laceration of the right fourth digit over the palmar aspect distally. There was noted to be about 2.5 cm, sutures were placed on September 4 proximal 8 days ago. She's complained of increased pain. She has noted that the finger appears erythematous.   Past Medical History  Diagnosis Date  . Diabetes mellitus without complication   . Hypertension   . Chronic pain   . Depression   . Bipolar 1 disorder   . Drug abuse     Patient Active Problem List   Diagnosis Date Noted  . Bipolar disorder, now depressed 08/30/2014  . Cocaine abuse 08/30/2014    Past Surgical History  Procedure Laterality Date  . Back surgery    . Skin cancer excision    . Carpal tunnel release    . Tumor removal      Allergies Review of patient's allergies indicates no known allergies.  Social History Social History  Substance Use Topics  . Smoking status: Current Every Day Smoker  . Smokeless tobacco: Never Used  . Alcohol Use: No    Review of Systems Musculoskeletal: Positive for finger pain with range of motion Skin: Positive for redness to the finger Neurological: focal weakness or numbness.  ____________________________________________   PHYSICAL EXAM:  VITAL SIGNS: ED Triage Vitals  Enc Vitals Group     BP --      Pulse --      Resp --      Temp --      Temp src --      SpO2 09/25/14 1057 98 %     Weight 09/25/14 1057 218 lb (98.884 kg)     Height 09/25/14 1057 5\' 7"  (1.702 m)     Head Cir --      Peak Flow --      Pain Score --      Pain Loc --      Pain Edu? --    Excl. in [place]? --     Constitutional: Alert and oriented. Well appearing and in no distress. Musculoskeletal: There is any erythema over the distal palmar aspect of the right fourth digit, particularly the fat pad. Sutures are in place but there is evidence of infection and purulence at the suture sites Neurologic:  No gross focal neurologic deficits are appreciated.  Skin:  Erythema with slight purulence at suture sites Psychiatric: Mood and affect are normal.  ____________________________________________  ED COURSE:  Pertinent labs & imaging results that were available during my care of the patient were reviewed by me and considered in my medical decision making (see chart for details). Although it rather leave the sutures in longer, there is evidence of wound infection. Sutures will need to be removed and she'll need to be on oral and topical antibiotics ____________________________________________    FINAL ASSESSMENT AND PLAN  Wound infection  Plan: Patient with labs and imaging as dictated above. Sutures were removed here without difficulty however there is some purulence and drainage from the wound itself. She'll be given Bactroban and Septra. Advised to follow-up in 2 days for recheck if not improved   Earleen Newport,  MD   Earleen Newport, MD 09/25/14 1106

## 2015-04-02 ENCOUNTER — Other Ambulatory Visit: Payer: Self-pay | Admitting: Family Medicine

## 2015-04-02 DIAGNOSIS — Z1231 Encounter for screening mammogram for malignant neoplasm of breast: Secondary | ICD-10-CM

## 2015-04-12 ENCOUNTER — Other Ambulatory Visit: Payer: Self-pay | Admitting: Family Medicine

## 2015-04-12 ENCOUNTER — Ambulatory Visit
Admission: RE | Admit: 2015-04-12 | Discharge: 2015-04-12 | Disposition: A | Discharge status: Home / Self Care | Payer: Medicare Other | Source: Ambulatory Visit | Attending: Family Medicine | Admitting: Family Medicine

## 2015-04-12 DIAGNOSIS — Z1231 Encounter for screening mammogram for malignant neoplasm of breast: Secondary | ICD-10-CM | POA: Insufficient documentation

## 2015-11-06 ENCOUNTER — Emergency Department
Admission: EM | Admit: 2015-11-06 | Discharge: 2015-11-06 | Disposition: A | Discharge status: Home / Self Care | Payer: Medicare Other | Attending: Emergency Medicine | Admitting: Emergency Medicine

## 2015-11-06 ENCOUNTER — Encounter: Payer: Self-pay | Admitting: Emergency Medicine

## 2015-11-06 DIAGNOSIS — F319 Bipolar disorder, unspecified: Secondary | ICD-10-CM | POA: Insufficient documentation

## 2015-11-06 DIAGNOSIS — Z79899 Other long term (current) drug therapy: Secondary | ICD-10-CM | POA: Insufficient documentation

## 2015-11-06 DIAGNOSIS — K029 Dental caries, unspecified: Secondary | ICD-10-CM | POA: Diagnosis not present

## 2015-11-06 DIAGNOSIS — K0889 Other specified disorders of teeth and supporting structures: Secondary | ICD-10-CM

## 2015-11-06 DIAGNOSIS — I10 Essential (primary) hypertension: Secondary | ICD-10-CM | POA: Diagnosis not present

## 2015-11-06 DIAGNOSIS — S025XXA Fracture of tooth (traumatic), initial encounter for closed fracture: Secondary | ICD-10-CM

## 2015-11-06 DIAGNOSIS — K0381 Cracked tooth: Secondary | ICD-10-CM | POA: Insufficient documentation

## 2015-11-06 DIAGNOSIS — F17200 Nicotine dependence, unspecified, uncomplicated: Secondary | ICD-10-CM | POA: Insufficient documentation

## 2015-11-06 DIAGNOSIS — E119 Type 2 diabetes mellitus without complications: Secondary | ICD-10-CM | POA: Insufficient documentation

## 2015-11-06 MED ORDER — IBUPROFEN 600 MG PO TABS: 600.0000 mg | ORAL_TABLET | Freq: Three times a day (TID) | ORAL | 0 refills | Status: DC | PRN

## 2015-11-06 MED ORDER — TRAMADOL HCL 50 MG PO TABS: 50.0000 mg | ORAL_TABLET | Freq: Four times a day (QID) | ORAL | 0 refills | Status: AC | PRN

## 2015-11-06 MED ORDER — LIDOCAINE VISCOUS 2 % MT SOLN
15.0000 mL | Freq: Once | OROMUCOSAL | Status: AC
  Administered 2015-11-06: 15 mL via OROMUCOSAL
  Filled 2015-11-06: qty 15

## 2015-11-06 MED ORDER — TRAMADOL HCL 50 MG PO TABS
50.0000 mg | ORAL_TABLET | Freq: Once | ORAL | Status: AC
  Administered 2015-11-06: 50 mg via ORAL
  Filled 2015-11-06: qty 1

## 2015-11-06 MED ORDER — AMOXICILLIN 500 MG PO CAPS: 500.0000 mg | ORAL_CAPSULE | Freq: Three times a day (TID) | ORAL | 0 refills | Status: DC

## 2015-11-06 NOTE — ED Provider Notes (Signed)
[facility] Emergency Department Provider Note   ____________________________________________   First MD Initiated Contact with Patient 11/06/15 1202     (approximate)  I have reviewed the triage vital signs and the nursing notes.   HISTORY  Chief Complaint Dental Pain    HPI Victoria Frey is a 55 y.o. female patient complaining of dental pain for several days. Patient has a broken lower incisor on the right side. Patient state trying to see a dentist for this complaint but has not had $100 co-pay to every question. Patient rates the pain as a 10 over 10. No palliative measures taken for this complaint.  Past Medical History:  Diagnosis Date  . Bipolar 1 disorder ([place])   . Chronic pain   . Depression   . Diabetes mellitus without complication ([place])   . Drug abuse   . Hypertension     Patient Active Problem List   Diagnosis Date Noted  . Bipolar disorder, now depressed ([place]) 08/30/2014  . Cocaine abuse 08/30/2014    Past Surgical History:  Procedure Laterality Date  . BACK SURGERY    . CARPAL TUNNEL RELEASE    . SKIN CANCER EXCISION    . TUMOR REMOVAL      Prior to Admission medications   Medication Sig Start Date End Date Taking? Authorizing Provider  amLODipine (NORVASC) 10 MG tablet Take 10 mg by mouth daily.   Yes Historical Provider, MD  FLUoxetine (PROZAC) 10 MG tablet Take 10 mg by mouth daily.   Yes Historical Provider, MD  gabapentin (NEURONTIN) 600 MG tablet Take 600 mg by mouth 3 (three) times daily.   Yes Historical Provider, MD  hydrOXYzine (ATARAX/VISTARIL) 50 MG tablet Take 50 mg by mouth 3 (three) times daily as needed.   Yes Historical Provider, MD  ibuprofen (ADVIL,MOTRIN) 800 MG tablet Take 800 mg by mouth every 8 (eight) hours as needed.   Yes Historical Provider, MD  lisinopril (PRINIVIL,ZESTRIL) 20 MG tablet Take 20 mg by mouth daily.   Yes Historical Provider, MD  QUEtiapine (SEROQUEL) 100 MG tablet Take 100 mg by  mouth at bedtime.   Yes Historical Provider, MD  amoxicillin (AMOXIL) 500 MG capsule Take 1 capsule (500 mg total) by mouth 3 (three) times daily. 11/06/15   Sable Feil, PA-C  HYDROcodone-acetaminophen (NORCO/VICODIN) 5-325 MG per tablet Take 1 tablet by mouth every 4 (four) hours as needed for moderate pain. 07/17/14   Harvest Dark, MD  ibuprofen (ADVIL,MOTRIN) 600 MG tablet Take 1 tablet (600 mg total) by mouth every 8 (eight) hours as needed. 11/06/15   Sable Feil, PA-C  lip balm (CARMEX) ointment Apply 1 application topically as needed for lip care. 06/28/14   III Luanna Cole Ruffian, PA-C  meloxicam (MOBIC) 15 MG tablet Take 1 tablet (15 mg total) by mouth daily as needed for pain. 08/02/14   Marylene Land, NP  sulfamethoxazole-trimethoprim (BACTRIM DS) 800-160 MG per tablet Take 1 tablet by mouth 2 (two) times daily. 09/25/14   Earleen Newport, MD  traMADol (ULTRAM) 50 MG tablet Take 1 tablet (50 mg total) by mouth every 8 (eight) hours as needed (Do not drive or operate machinery while taking as can cause drowsiness.). 08/02/14   Marylene Land, NP  traMADol (ULTRAM) 50 MG tablet Take 1 tablet (50 mg total) by mouth every 6 (six) hours as needed. 11/06/15 11/05/16  Sable Feil, PA-C    Allergies Review of patient's allergies indicates no known allergies.  Family History  Problem Relation Age of Onset  . Breast cancer Sister 69    Social History Social History  Substance Use Topics  . Smoking status: Current Every Day Smoker  . Smokeless tobacco: Never Used  . Alcohol use No    Review of Systems Constitutional: No fever/chills Eyes: No visual changes. ENT: No sore throat.Dental pain Cardiovascular: Denies chest pain. Respiratory: Denies shortness of breath. Gastrointestinal: No abdominal pain.  No nausea, no vomiting.  No diarrhea.  No constipation. Genitourinary: Negative for dysuria. Musculoskeletal: Negative for back pain. Skin: Negative for  rash. Neurological: Negative for headaches, focal weakness or numbness. Psychiatric:Bipolar and depression. History of drug abuse. Endocrine:Hypertension and diabetes  ____________________________________________   PHYSICAL EXAM:  VITAL SIGNS: ED Triage Vitals  Enc Vitals Group     BP 11/06/15 1127 (!) 155/78     Pulse Rate 11/06/15 1127 74     Resp 11/06/15 1127 20     Temp 11/06/15 1127 98 F (36.7 C)     Temp Source 11/06/15 1127 Oral     SpO2 11/06/15 1127 97 %     Weight 11/06/15 1125 215 lb (97.5 kg)     Height 11/06/15 1125 5\' 7"  (1.702 m)     Head Circumference --      Peak Flow --      Pain Score 11/06/15 1125 10     Pain Loc --      Pain Edu? --      Excl. in [place]? --     Constitutional: Alert and oriented. Well appearing and in no acute distress. Eyes: Conjunctivae are normal. PERRL. EOMI. Head: Atraumatic. Nose: No congestion/rhinnorhea. Mouth/Throat: Mucous membranes are moist.  Oropharynx non-erythematous.Multiple deep vitalized and fractured teeth. Neck: No stridor.  No cervical spine tenderness to palpation. Hematological/Lymphatic/Immunilogical: No cervical lymphadenopathy. Cardiovascular: Normal rate, regular rhythm. Grossly normal heart sounds.  Good peripheral circulation. Respiratory: Normal respiratory effort.  No retractions. Lungs CTAB. Gastrointestinal: Soft and nontender. No distention. No abdominal bruits. No CVA tenderness. Musculoskeletal: No lower extremity tenderness nor edema.  No joint effusions. Neurologic:  Normal speech and language. No gross focal neurologic deficits are appreciated. No gait instability. Skin:  Skin is warm, dry and intact. No rash noted. Psychiatric: Mood and affect are normal. Speech and behavior are normal.  ____________________________________________   LABS (all labs ordered are listed, but only abnormal results are displayed)  Labs Reviewed - No data to  display ____________________________________________  EKG   ____________________________________________  RADIOLOGY   ____________________________________________   PROCEDURES  Procedure(s) performed: None  Procedures  Critical Care performed: No  ____________________________________________   INITIAL IMPRESSION / ASSESSMENT AND PLAN / ED COURSE  Pertinent labs & imaging results that were available during my care of the patient were reviewed by me and considered in my medical decision making (see chart for details).  Dental pain secondary to fractured tooth. Patient given discharge care instructions. Patient given a list of dental clinic follow-up care. Patient given prescription for amoxicillin, tramadol, and ibuprofen.  Clinical Course   Patient given viscous lidocaine and tramadol while waiting for transportation.  ____________________________________________   FINAL CLINICAL IMPRESSION(S) / ED DIAGNOSES  Final diagnoses:  Closed fracture of tooth, initial encounter  Toothache  Pain due to dental caries      NEW MEDICATIONS STARTED DURING THIS VISIT:  New Prescriptions   AMOXICILLIN (AMOXIL) 500 MG CAPSULE    Take 1 capsule (500 mg total) by mouth 3 (three) times daily.   IBUPROFEN (  ADVIL,MOTRIN) 600 MG TABLET    Take 1 tablet (600 mg total) by mouth every 8 (eight) hours as needed.   TRAMADOL (ULTRAM) 50 MG TABLET    Take 1 tablet (50 mg total) by mouth every 6 (six) hours as needed.     Note:  This document was prepared using Dragon voice recognition software and may include unintentional dictation errors.    Sable Feil, PA-C 11/06/15 1234    Lisa Roca, MD 11/06/15 [phone]

## 2015-11-06 NOTE — ED Triage Notes (Signed)
States she developed tooth pain for several days  Has broken tooth

## 2015-11-06 NOTE — Discharge Instructions (Signed)
Follow-up for list of dental clinics provided. OPTIONS FOR DENTAL FOLLOW UP CARE  [facility] [place] [email or web address] [phone])  [facility] [phone])  [place] [phone])  [place] [phone])  [facility] [phone]) This clinic caters to the indigent population and is on a lottery system. Location: [facility], [place], [place], [place] Clinic Hours: Wednesdays from 6pm - 9pm, patients seen by a lottery system. For dates, call or go to [email or web address] Services: Cleanings, fillings and simple extractions. Payment Options: DENTAL WORK IS FREE OF CHARGE. Bring proof of income or support. Best way to get seen: Arrive at 5:15 pm - this is a lottery, NOT first come/first serve, so arriving earlier will not increase your chances of being seen.     [facility] [phone] Select option 1 for emergencies   Location: [facility], [place], [address] [place] Clinic Hours: No walk-ins accepted - call the day before to schedule an appointment. Check in times are 9:30 am and 1:30 pm. Services: Simple extractions, temporary fillings, pulpectomy/pulp debridement, uncomplicated abscess drainage. Payment Options: PAYMENT IS DUE AT THE TIME OF SERVICE.  Fee is usually $100-200, additional surgical procedures (e.g. abscess drainage) may be extra. Cash, checks, Visa/MasterCard accepted.  Can file Medicaid if patient is covered for dental - patient should call case worker to check. No discount for [facility] patients. Best way to get seen: MUST call the day before and get onto the schedule. Can usually be seen the next 1-2 days. No walk-ins accepted.      [place] [phone]   Location: [place], [place] Clinic Hours: M, W, Th, F 8am or 1:30pm, Tues 9a or 1:30 - first come/first served. Services: Simple extractions, temporary fillings, uncomplicated abscess drainage.  You do not need to be an [facility] resident. Payment Options: PAYMENT IS DUE AT THE TIME OF SERVICE. Dental insurance, otherwise sliding scale - bring proof of income or support. Depending on income and treatment needed, cost is usually $50-200. Best way to get seen: Arrive early as it is first come/first served.     [facility] [phone]   Location: [place] [facility] Hours: Mon-Thu 8a-5p Services: Most basic dental services including extractions and fillings. Payment Options: PAYMENT IS DUE AT THE TIME OF SERVICE. Sliding scale, up to 50% off - bring proof if income or support. Medicaid with dental option accepted. Best way to get seen: Call to schedule an appointment, can usually be seen within 2 weeks OR they will try to see walk-ins - show up at [place] or 2p (you may have to wait).     [facility] [place] RESIDENTS ONLY   Location: [facility], [place] [address] [place], [place] [address] Clinic Hours: By appointment only. Monday - Thursday 8am-5pm, Friday 8am-12pm Services: Cleanings, fillings, extractions. Payment Options: PAYMENT IS DUE AT THE TIME OF SERVICE. Cash, Visa or MasterCard. Sliding scale - $30 minimum per service. Best way to get seen: Come in to office, complete packet and make an appointment - need proof of income or support monies for each household member and proof of [facility] residence. Usually takes about a month to get in.     [facility]  [phone] °  °Location: °[address] [place] °Clinic Hours: Walk-in Urgent Care  Dental Services are offered Monday-Friday mornings only. °The numbers of emergencies accepted daily is limited to the number of °providers available. °Maximum 15 - Mondays, Wednesdays & Thursdays °Maximum 10 - Tuesdays & Fridays °Services: °You do not need to be a [place] resident to be seen for a dental emergency. °Emergencies are defined as pain, swelling, abnormal bleeding, or dental trauma. Walkins will receive x-rays if needed. °NOTE: Dental cleaning is not an emergency. °Payment Options: °PAYMENT IS DUE AT THE TIME OF SERVICE. °Minimum co-pay is $40.00 for uninsured patients. °Minimum co-pay is $3.00 for Medicaid with dental coverage. °Dental Insurance is accepted and must be presented at time of visit. °Medicare does not cover dental. °Forms of payment: Cash, credit card, checks. °Best way to get seen: °If not previously registered with the clinic, walk-in dental registration begins at 7:15 am and is on a first come/first serve basis. °If previously registered with the clinic, call to make an appointment. °  °  °[facility] °[phone] °[place] RESIDENTS ONLY °  °Location: °[address], [place], [place] °Clinic Hours: °Mon-Thu 10a-2p °Services: Extractions only! °Payment Options: °FREE (donations accepted) - bring proof of income or support °Best way to get seen: °Call and schedule an appointment OR come at 8am on the 1st Monday of every month (except for holidays) when it is first come/first served. °  °  °[facility] °[phone] °  °Location: °[address], [place] °Clinic Hours: °Friday mornings °Services, Payment Options, Best way to get seen: °Call for info ° °

## 2017-03-08 ENCOUNTER — Encounter: Payer: Self-pay | Admitting: Emergency Medicine

## 2017-03-08 ENCOUNTER — Emergency Department
Admission: EM | Admit: 2017-03-08 | Discharge: 2017-03-08 | Disposition: A | Discharge status: Home / Self Care | Payer: Medicare Other | Attending: Emergency Medicine | Admitting: Emergency Medicine

## 2017-03-08 ENCOUNTER — Other Ambulatory Visit: Payer: Self-pay

## 2017-03-08 DIAGNOSIS — E119 Type 2 diabetes mellitus without complications: Secondary | ICD-10-CM | POA: Diagnosis not present

## 2017-03-08 DIAGNOSIS — Z85828 Personal history of other malignant neoplasm of skin: Secondary | ICD-10-CM | POA: Diagnosis not present

## 2017-03-08 DIAGNOSIS — R21 Rash and other nonspecific skin eruption: Secondary | ICD-10-CM | POA: Insufficient documentation

## 2017-03-08 DIAGNOSIS — Z79899 Other long term (current) drug therapy: Secondary | ICD-10-CM | POA: Insufficient documentation

## 2017-03-08 DIAGNOSIS — F17200 Nicotine dependence, unspecified, uncomplicated: Secondary | ICD-10-CM | POA: Diagnosis not present

## 2017-03-08 DIAGNOSIS — I10 Essential (primary) hypertension: Secondary | ICD-10-CM | POA: Insufficient documentation

## 2017-03-08 MED ORDER — CEPHALEXIN 500 MG PO CAPS: 500.0000 mg | ORAL_CAPSULE | Freq: Four times a day (QID) | ORAL | 0 refills | Status: DC

## 2017-03-08 MED ORDER — PREDNISONE 10 MG PO TABS: ORAL_TABLET | ORAL | 0 refills | Status: DC

## 2017-03-08 MED ORDER — DIPHENHYDRAMINE HCL 25 MG PO CAPS
50.0000 mg | ORAL_CAPSULE | Freq: Once | ORAL | Status: AC
  Administered 2017-03-08: 50 mg via ORAL
  Filled 2017-03-08: qty 2

## 2017-03-08 MED ORDER — ACETAMINOPHEN 500 MG PO TABS
1000.0000 mg | ORAL_TABLET | Freq: Once | ORAL | Status: AC
  Administered 2017-03-08: 1000 mg via ORAL
  Filled 2017-03-08: qty 2

## 2017-03-08 NOTE — Discharge Instructions (Signed)
Call make an appointment with your primary care provider for the end of the week for reevaluation of your scalp.  Do not scratch area.  Do not use any hair coloring products until the area has completely healed.  Begin taking Keflex 500 mg 4 times daily until finished.  Benadryl as needed for itching you may take 1 or 2 capsules every 6 hours if needed.  Prednisone 3 tablets once a day for the next 4 days.

## 2017-03-08 NOTE — ED Triage Notes (Signed)
Pt has red places to scalp that she describes as burning and itching.  Tried neosporin. reports spots started 1 week before coloring her hair, there 2 weeks total.  VSS.  NAD

## 2017-03-08 NOTE — ED Provider Notes (Signed)
[facility] Emergency Department Provider Note  ___________________________________________   First MD Initiated Contact with Patient 03/08/17 1639     (approximate)  I have reviewed the triage vital signs and the nursing notes.   HISTORY  Chief Complaint Rash  HPI Victoria Frey is a 57 y.o. female 0 complaint of red places to her scalp that she describes as itching burning.  Patient has been using topical Neosporin to the area without any improvement.  Patient states that spot started 1 week before she colored her hair so they have been there for a total of 2 weeks.  Patient states that when she scratches at the area as they begin to bleed.  She has taken ibuprofen for pain.   Past Medical History:  Diagnosis Date  . Bipolar 1 disorder ([place])   . Chronic pain   . Depression   . Diabetes mellitus without complication ([place])   . Drug abuse ([place])   . Hypertension     Patient Active Problem List   Diagnosis Date Noted  . Bipolar disorder, now depressed ([place]) 08/30/2014  . Cocaine abuse ([place]) 08/30/2014    Past Surgical History:  Procedure Laterality Date  . BACK SURGERY    . CARPAL TUNNEL RELEASE    . SKIN CANCER EXCISION    . TUMOR REMOVAL      Prior to Admission medications   Medication Sig Start Date End Date Taking? Authorizing Provider  amLODipine (NORVASC) 10 MG tablet Take 10 mg by mouth daily.    [provider]  cephALEXin (KEFLEX) 500 MG capsule Take 1 capsule (500 mg total) by mouth 4 (four) times daily. 03/08/17   Johnn Hai, PA-C  FLUoxetine (PROZAC) 10 MG tablet Take 10 mg by mouth daily.    [provider]  gabapentin (NEURONTIN) 600 MG tablet Take 600 mg by mouth 3 (three) times daily.    [provider]  hydrOXYzine (ATARAX/VISTARIL) 50 MG tablet Take 50 mg by mouth 3 (three) times daily as needed.    [provider]  lip balm (CARMEX) ointment Apply 1 application topically as needed  for lip care. 06/28/14   Ruffian, III Luanna Cole, PA-C  lisinopril (PRINIVIL,ZESTRIL) 20 MG tablet Take 20 mg by mouth daily.    [provider]  meloxicam (MOBIC) 15 MG tablet Take 1 tablet (15 mg total) by mouth daily as needed for pain. 08/02/14   Marylene Land, NP  predniSONE (DELTASONE) 10 MG tablet Take 3 tablets once a day for 4 days 03/08/17   Johnn Hai, PA-C  QUEtiapine (SEROQUEL) 100 MG tablet Take 100 mg by mouth at bedtime.    [provider]  sulfamethoxazole-trimethoprim (BACTRIM DS) 800-160 MG per tablet Take 1 tablet by mouth 2 (two) times daily. 09/25/14   Earleen Newport, MD  traMADol (ULTRAM) 50 MG tablet Take 1 tablet (50 mg total) by mouth every 8 (eight) hours as needed (Do not drive or operate machinery while taking as can cause drowsiness.). 08/02/14   Marylene Land, NP    Allergies Patient has no known allergies.  Family History  Problem Relation Age of Onset  . Breast cancer Sister 45    Social History Social History   Tobacco Use  . Smoking status: Current Every Day Smoker  . Smokeless tobacco: Never Used  Substance Use Topics  . Alcohol use: No  . Drug use: Yes    Types: Cocaine    Review of Systems Constitutional: No  fever/chills Cardiovascular: Denies chest pain. Respiratory: Denies shortness of breath. Gastrointestinal:  No nausea, no vomiting. Musculoskeletal: Negative for back pain. Skin: Positive for skin rash of the scalp. Neurological: Negative headaches, negative for focal weakness or numbness. ____________________________________________   PHYSICAL EXAM:  VITAL SIGNS: ED Triage Vitals  Enc Vitals Group     BP 03/08/17 1529 (!) 156/80     Pulse Rate 03/08/17 1529 86     Resp 03/08/17 1529 20     Temp 03/08/17 1529 98.2 F (36.8 C)     Temp Source 03/08/17 1529 Oral     SpO2 03/08/17 1529 96 %     Weight 03/08/17 1530 178 lb (80.7 kg)     Height 03/08/17 1530 5\' 7"  (1.702 m)     Head Circumference  --      Peak Flow --      Pain Score 03/08/17 1532 8     Pain Loc --      Pain Edu? --      Excl. in [place]? --    Constitutional: Alert and oriented. Well appearing and in no acute distress. Eyes: Conjunctivae are normal.  Head: Atraumatic. Neck: No stridor.   Hematological/Lymphatic/Immunilogical: No cervical lymphadenopathy. Cardiovascular: Normal rate, regular rhythm. Grossly normal heart sounds.  Good peripheral circulation. Respiratory: Normal respiratory effort.  No retractions. Lungs CTAB. Musculoskeletal: His upper and lower extremities without any difficulty.  Normal gait was noted. Neurologic:  Normal speech and language. No gross focal neurologic deficits are appreciated. No gait instability. Skin:  Skin is warm, dry.  Diffusely through the scalp especially on the left lateral aspect there or patches that are erythematous with scaling.  No drainage is noted.  One lesion near the left forehead is open secondary to patient scratching. Psychiatric: Mood and affect are normal. Speech and behavior are normal.  ____________________________________________   LABS (all labs ordered are listed, but only abnormal results are displayed)  Labs Reviewed - No data to display  PROCEDURES  Procedure(s) performed: None  Procedures  Critical Care performed: No  ____________________________________________   INITIAL IMPRESSION / ASSESSMENT AND PLAN / ED COURSE Patient was given Benadryl while in the emergency department for itching.  She is encouraged to not scratch these areas and also not to color her hair until these areas have completely healed.  She was also given a prescription for prednisone to take once daily for the next 4 days and Keflex for skin infection.  She is to follow-up with her PCP if any continued problems.  ____________________________________________   FINAL CLINICAL IMPRESSION(S) / ED DIAGNOSES  Final diagnoses:  Rash and nonspecific skin eruption     ED  Discharge Orders        Ordered    predniSONE (DELTASONE) 10 MG tablet  Status:  Discontinued     03/08/17 1740    predniSONE (DELTASONE) 10 MG tablet     03/08/17 1743    cephALEXin (KEFLEX) 500 MG capsule  4 times daily     03/08/17 1743       Note:  This document was prepared using Dragon voice recognition software and may include unintentional dictation errors.    Johnn Hai, PA-C 03/08/17 1924    Delman Kitten, MD 03/08/17 [phone]

## 2017-07-20 IMAGING — CR DG SHOULDER 2+V*R*
3 series · 3 of 3 positions shown · non-contrast
Comparison: None.

CLINICAL DATA: 54-year-old female with a history of right shoulder
pain for 2 months

EXAM:
RIGHT SHOULDER - 2+ VIEW

[shoulder grashey]
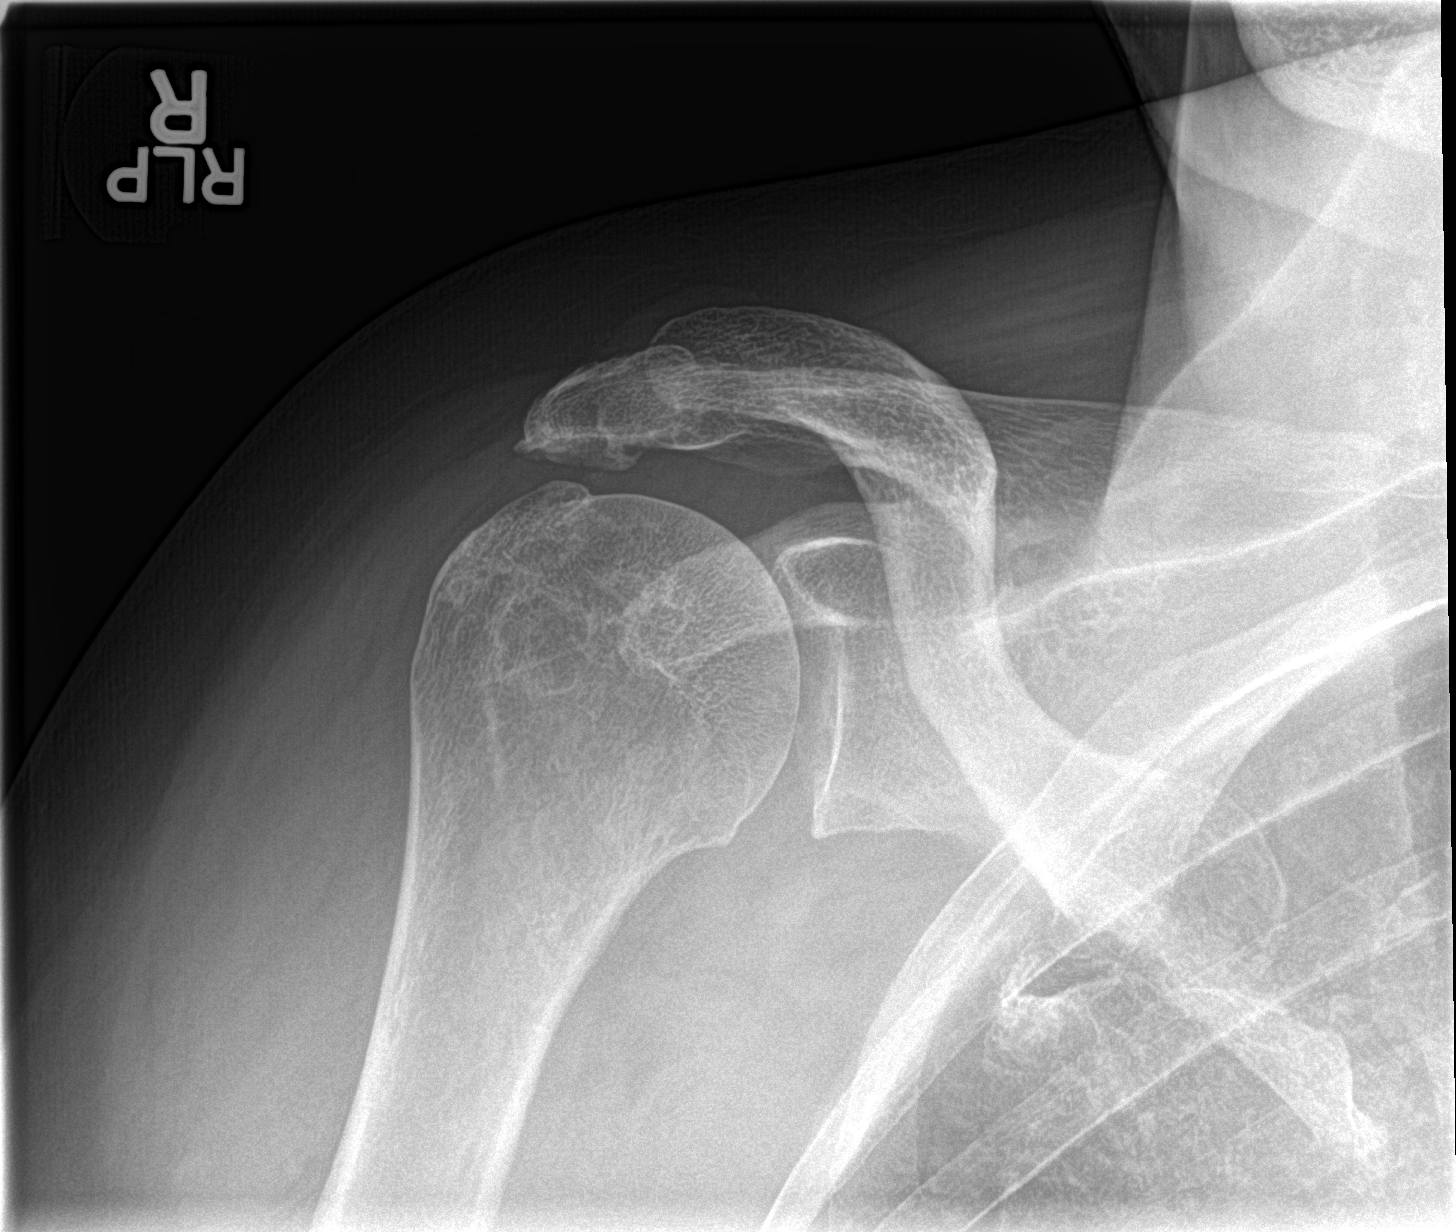

[shoulder y view]
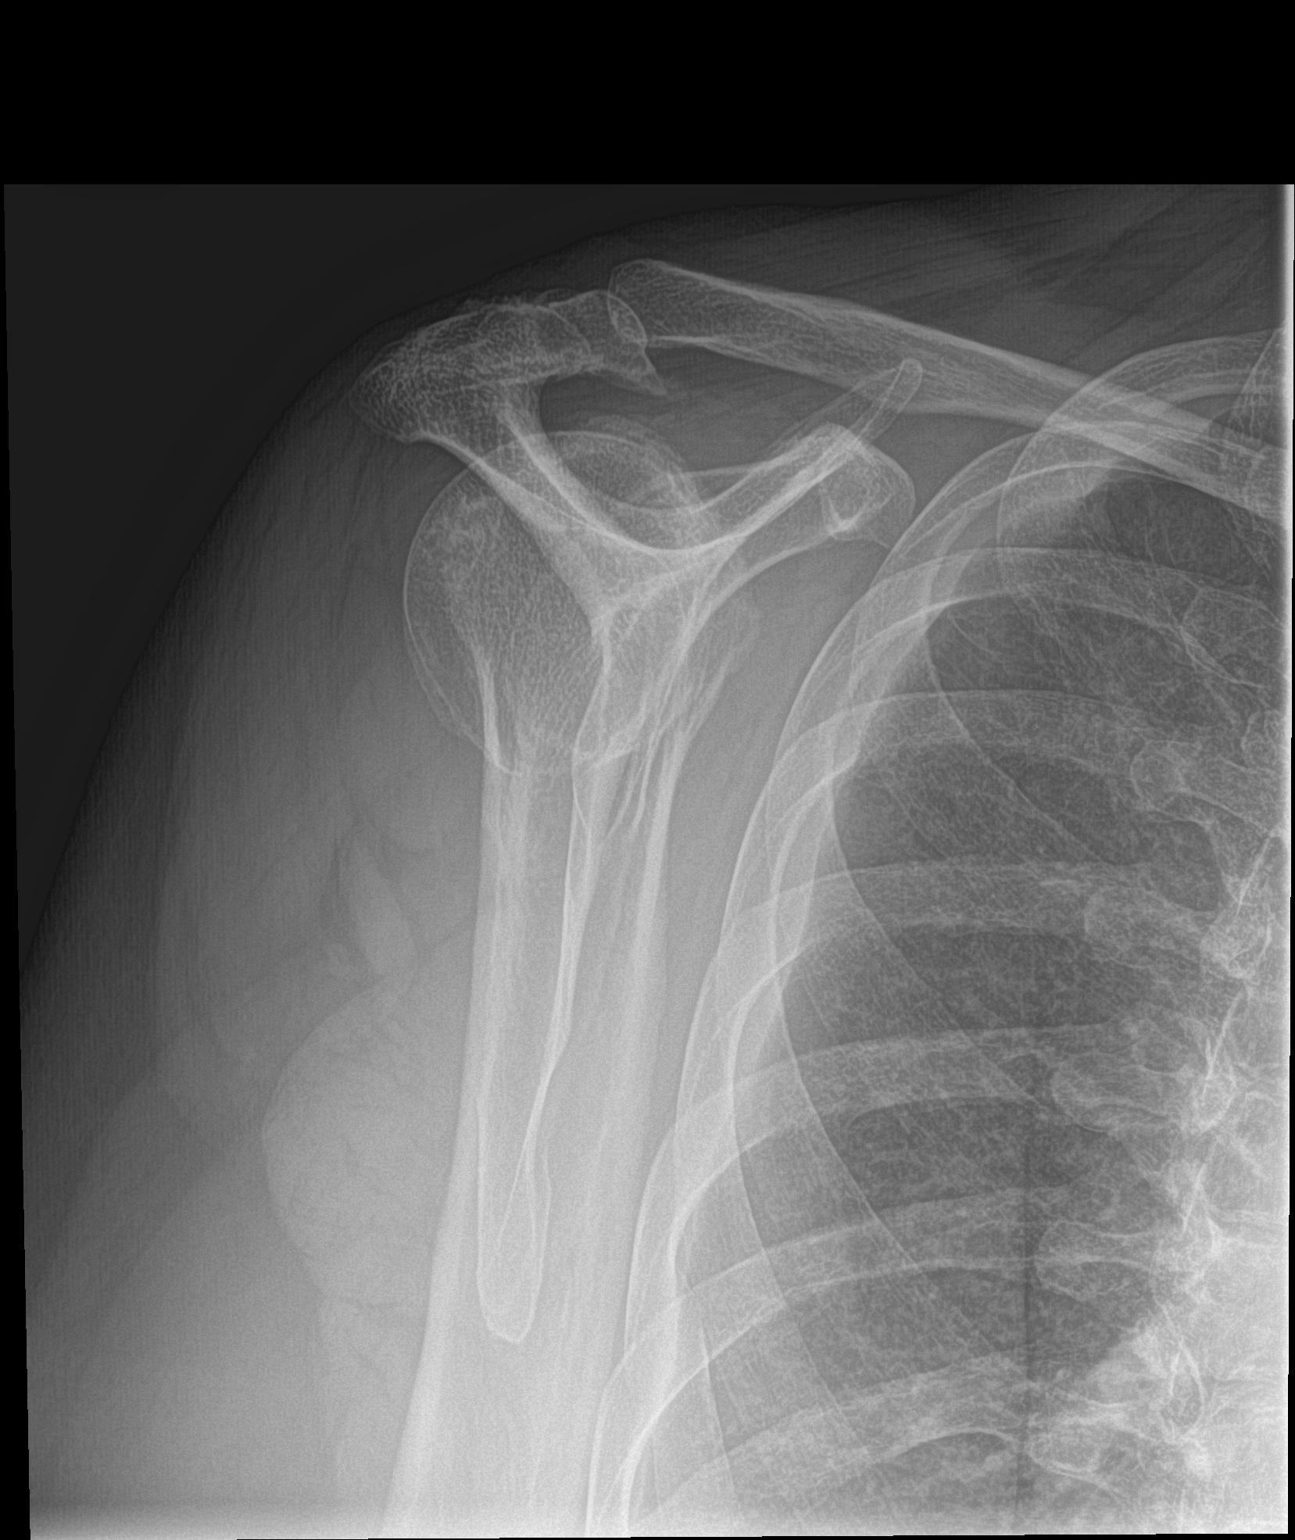

[shoulder axillary]
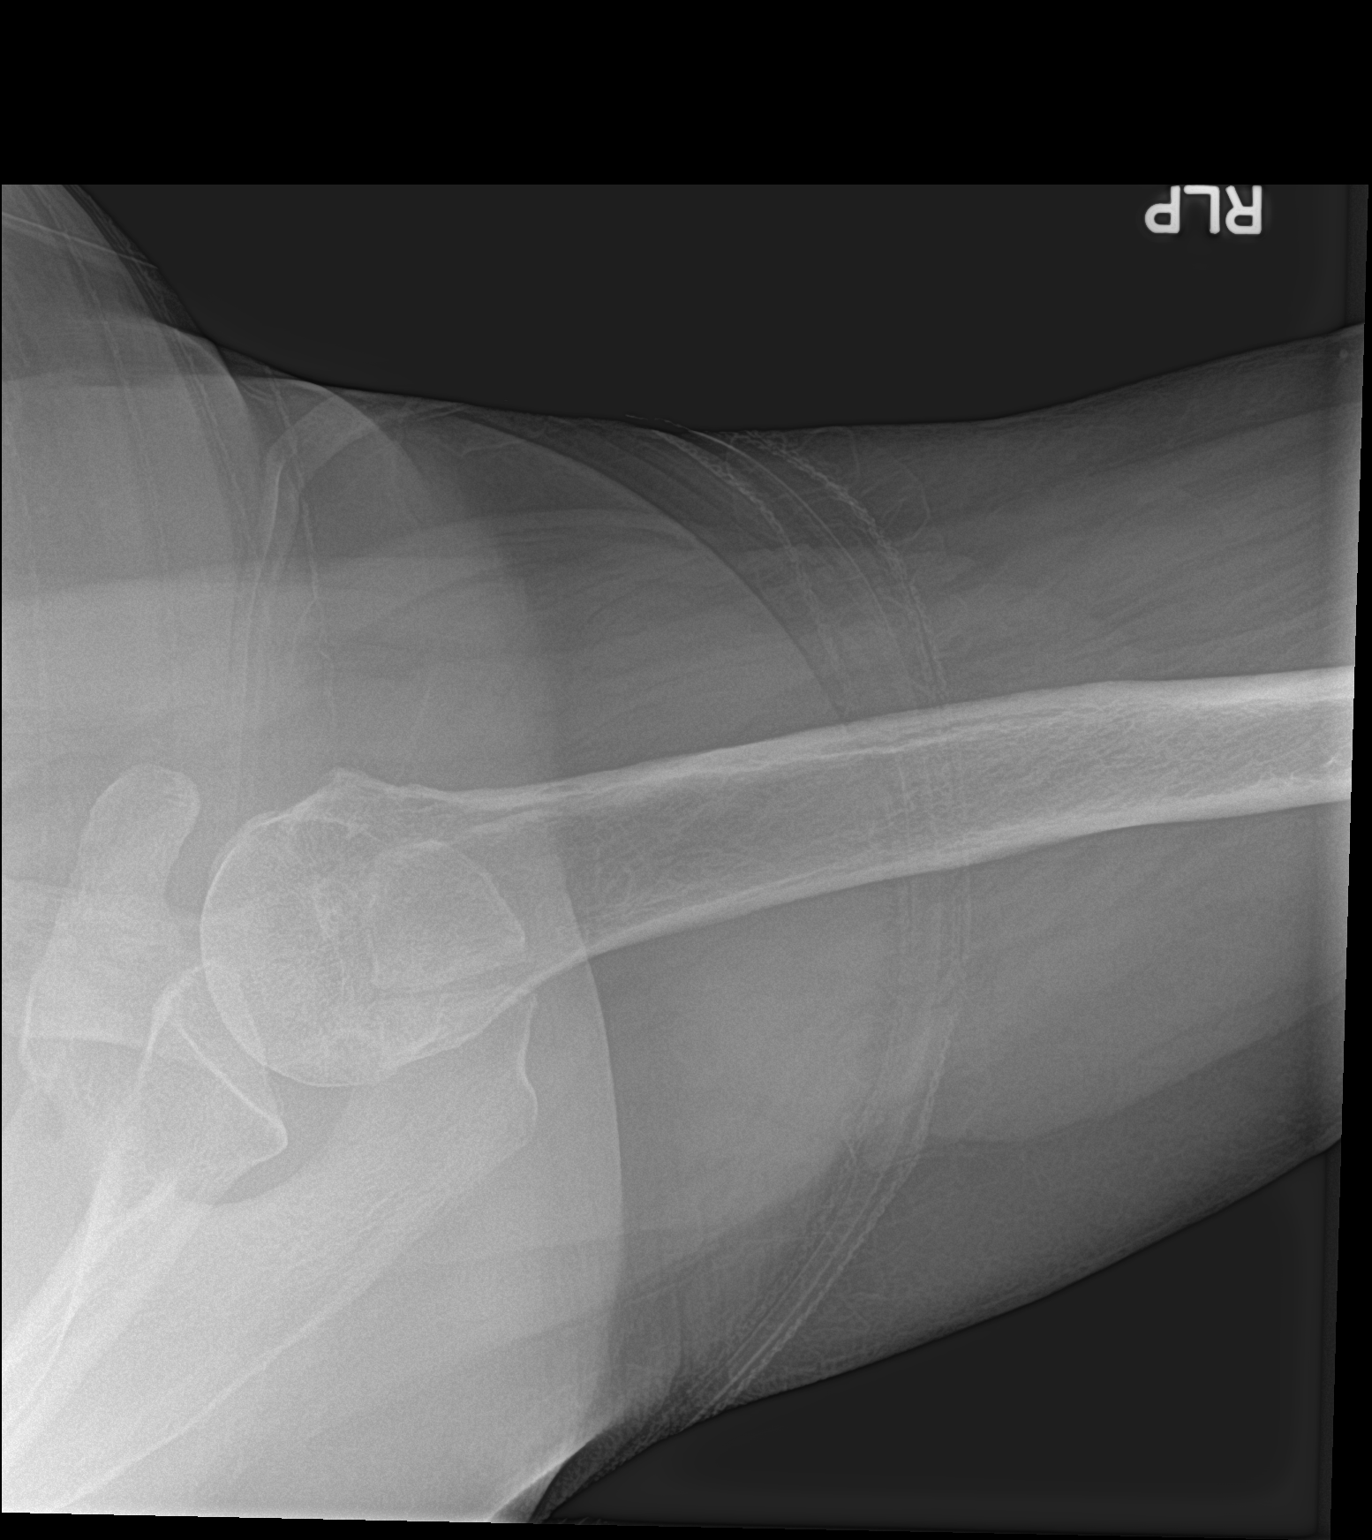

[3 of 3 positions shown; findings below may reference images not displayed]

FINDINGS: No acute bony abnormality. Glenohumeral joint appears congruent.
Subacromial spurring. Unremarkable appearance of the visualized
thorax.
IMPRESSION: No acute bony abnormality.

Subacromial spurring.

## 2017-08-03 ENCOUNTER — Other Ambulatory Visit: Payer: Self-pay | Admitting: Family Medicine

## 2017-08-03 DIAGNOSIS — Z1231 Encounter for screening mammogram for malignant neoplasm of breast: Secondary | ICD-10-CM

## 2018-09-20 ENCOUNTER — Emergency Department
Admission: EM | Admit: 2018-09-20 | Discharge: 2018-09-20 | Disposition: A | Discharge status: Home / Self Care | Payer: Medicare Other | Attending: Emergency Medicine | Admitting: Emergency Medicine

## 2018-09-20 ENCOUNTER — Encounter: Payer: Self-pay | Admitting: Emergency Medicine

## 2018-09-20 ENCOUNTER — Other Ambulatory Visit: Payer: Self-pay

## 2018-09-20 DIAGNOSIS — Y92010 Kitchen of single-family (private) house as the place of occurrence of the external cause: Secondary | ICD-10-CM | POA: Diagnosis not present

## 2018-09-20 DIAGNOSIS — F17210 Nicotine dependence, cigarettes, uncomplicated: Secondary | ICD-10-CM | POA: Diagnosis not present

## 2018-09-20 DIAGNOSIS — W2203XA Walked into furniture, initial encounter: Secondary | ICD-10-CM | POA: Insufficient documentation

## 2018-09-20 DIAGNOSIS — S0101XA Laceration without foreign body of scalp, initial encounter: Secondary | ICD-10-CM | POA: Diagnosis not present

## 2018-09-20 DIAGNOSIS — F1410 Cocaine abuse, uncomplicated: Secondary | ICD-10-CM | POA: Insufficient documentation

## 2018-09-20 DIAGNOSIS — Y9301 Activity, walking, marching and hiking: Secondary | ICD-10-CM | POA: Diagnosis not present

## 2018-09-20 DIAGNOSIS — Z79899 Other long term (current) drug therapy: Secondary | ICD-10-CM | POA: Insufficient documentation

## 2018-09-20 DIAGNOSIS — Y998 Other external cause status: Secondary | ICD-10-CM | POA: Diagnosis not present

## 2018-09-20 DIAGNOSIS — I10 Essential (primary) hypertension: Secondary | ICD-10-CM | POA: Insufficient documentation

## 2018-09-20 DIAGNOSIS — E119 Type 2 diabetes mellitus without complications: Secondary | ICD-10-CM | POA: Insufficient documentation

## 2018-09-20 DIAGNOSIS — S0990XA Unspecified injury of head, initial encounter: Secondary | ICD-10-CM | POA: Diagnosis present

## 2018-09-20 MED ORDER — IBUPROFEN 800 MG PO TABS: 800.0000 mg | ORAL_TABLET | Freq: Three times a day (TID) | ORAL | 0 refills | Status: DC | PRN

## 2018-09-20 MED ORDER — LORAZEPAM 1 MG PO TABS
1.0000 mg | ORAL_TABLET | Freq: Once | ORAL | Status: AC
  Administered 2018-09-20: 1 mg via ORAL
  Filled 2018-09-20: qty 1

## 2018-09-20 NOTE — ED Triage Notes (Addendum)
Pt presents to ED with abrasion to top of head after hitting it on the bottom of the cabinet. Pt denies loc. Bleeding controlled currently. Pt states it just hurts when she takes pressure off it. Pt very talkative during triage and appears anxious.
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
## 2018-09-21 ENCOUNTER — Other Ambulatory Visit: Payer: Self-pay | Admitting: Family Medicine

## 2018-09-21 DIAGNOSIS — Z1231 Encounter for screening mammogram for malignant neoplasm of breast: Secondary | ICD-10-CM

## 2018-10-15 ENCOUNTER — Emergency Department: Payer: Medicare Other

## 2018-10-15 ENCOUNTER — Other Ambulatory Visit: Payer: Self-pay

## 2018-10-15 ENCOUNTER — Emergency Department
Admission: EM | Admit: 2018-10-15 | Discharge: 2018-10-15 | Disposition: A | Discharge status: Home / Self Care | Payer: Medicare Other | Attending: Emergency Medicine | Admitting: Emergency Medicine

## 2018-10-15 ENCOUNTER — Encounter: Payer: Self-pay | Admitting: Emergency Medicine

## 2018-10-15 DIAGNOSIS — Z23 Encounter for immunization: Secondary | ICD-10-CM | POA: Insufficient documentation

## 2018-10-15 DIAGNOSIS — S161XXA Strain of muscle, fascia and tendon at neck level, initial encounter: Secondary | ICD-10-CM | POA: Insufficient documentation

## 2018-10-15 DIAGNOSIS — M25571 Pain in right ankle and joints of right foot: Secondary | ICD-10-CM | POA: Insufficient documentation

## 2018-10-15 DIAGNOSIS — E119 Type 2 diabetes mellitus without complications: Secondary | ICD-10-CM | POA: Insufficient documentation

## 2018-10-15 DIAGNOSIS — I10 Essential (primary) hypertension: Secondary | ICD-10-CM | POA: Diagnosis not present

## 2018-10-15 DIAGNOSIS — Y9389 Activity, other specified: Secondary | ICD-10-CM | POA: Insufficient documentation

## 2018-10-15 DIAGNOSIS — M545 Low back pain, unspecified: Secondary | ICD-10-CM

## 2018-10-15 DIAGNOSIS — Z79899 Other long term (current) drug therapy: Secondary | ICD-10-CM | POA: Diagnosis not present

## 2018-10-15 DIAGNOSIS — F17210 Nicotine dependence, cigarettes, uncomplicated: Secondary | ICD-10-CM | POA: Insufficient documentation

## 2018-10-15 DIAGNOSIS — Y999 Unspecified external cause status: Secondary | ICD-10-CM | POA: Diagnosis not present

## 2018-10-15 DIAGNOSIS — S0001XA Abrasion of scalp, initial encounter: Secondary | ICD-10-CM | POA: Diagnosis not present

## 2018-10-15 DIAGNOSIS — Y92410 Unspecified street and highway as the place of occurrence of the external cause: Secondary | ICD-10-CM | POA: Diagnosis not present

## 2018-10-15 DIAGNOSIS — S0990XA Unspecified injury of head, initial encounter: Secondary | ICD-10-CM | POA: Diagnosis present

## 2018-10-15 MED ORDER — TETANUS-DIPHTH-ACELL PERTUSSIS 5-2.5-18.5 LF-MCG/0.5 IM SUSP
0.5000 mL | Freq: Once | INTRAMUSCULAR | Status: AC
  Administered 2018-10-15: 0.5 mL via INTRAMUSCULAR
  Filled 2018-10-15: qty 0.5

## 2018-10-15 MED ORDER — HYDROCODONE-ACETAMINOPHEN 5-325 MG PO TABS: 1.0000 | ORAL_TABLET | Freq: Four times a day (QID) | ORAL | 0 refills | Status: DC | PRN

## 2018-10-15 MED ORDER — LIDOCAINE-EPINEPHRINE-TETRACAINE (LET) SOLUTION
3.0000 mL | Freq: Once | NASAL | Status: AC
  Administered 2018-10-15: 3 mL via TOPICAL
  Filled 2018-10-15: qty 3

## 2018-10-15 MED ORDER — OXYCODONE-ACETAMINOPHEN 5-325 MG PO TABS
1.0000 | ORAL_TABLET | Freq: Once | ORAL | Status: AC
  Administered 2018-10-15: 1 via ORAL
  Filled 2018-10-15: qty 1
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
## 2018-10-15 NOTE — ED Notes (Signed)
Pt given crackers and drink at this time 
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
## 2018-10-15 NOTE — Discharge Instructions (Signed)
Follow-up with your primary care provider if any continued problems.  Take pain medication only as directed.  You may also use ice to your ankle neck and back as needed for discomfort.  Even with medication it may take 4 to 5 days for most of the soreness to improve.

## 2018-10-29 ENCOUNTER — Encounter: Payer: Self-pay | Admitting: Emergency Medicine

## 2018-10-29 ENCOUNTER — Emergency Department: Payer: Medicare Other

## 2018-10-29 ENCOUNTER — Other Ambulatory Visit: Payer: Self-pay

## 2018-10-29 DIAGNOSIS — Y929 Unspecified place or not applicable: Secondary | ICD-10-CM | POA: Diagnosis not present

## 2018-10-29 DIAGNOSIS — F1410 Cocaine abuse, uncomplicated: Secondary | ICD-10-CM | POA: Diagnosis not present

## 2018-10-29 DIAGNOSIS — Y939 Activity, unspecified: Secondary | ICD-10-CM | POA: Diagnosis not present

## 2018-10-29 DIAGNOSIS — E119 Type 2 diabetes mellitus without complications: Secondary | ICD-10-CM | POA: Diagnosis not present

## 2018-10-29 DIAGNOSIS — S93401A Sprain of unspecified ligament of right ankle, initial encounter: Secondary | ICD-10-CM | POA: Diagnosis not present

## 2018-10-29 DIAGNOSIS — I10 Essential (primary) hypertension: Secondary | ICD-10-CM | POA: Diagnosis not present

## 2018-10-29 DIAGNOSIS — Z79899 Other long term (current) drug therapy: Secondary | ICD-10-CM | POA: Diagnosis not present

## 2018-10-29 DIAGNOSIS — F17210 Nicotine dependence, cigarettes, uncomplicated: Secondary | ICD-10-CM | POA: Diagnosis not present

## 2018-10-29 DIAGNOSIS — Y999 Unspecified external cause status: Secondary | ICD-10-CM | POA: Diagnosis not present

## 2018-10-29 DIAGNOSIS — R21 Rash and other nonspecific skin eruption: Secondary | ICD-10-CM | POA: Insufficient documentation

## 2018-10-29 DIAGNOSIS — Y33XXXA Other specified events, undetermined intent, initial encounter: Secondary | ICD-10-CM | POA: Diagnosis not present

## 2018-10-29 DIAGNOSIS — S99911A Unspecified injury of right ankle, initial encounter: Secondary | ICD-10-CM | POA: Diagnosis present

## 2018-10-29 NOTE — ED Triage Notes (Signed)
Pt arrives POV to triage with c/o right foot swelling. Pt also has rash on both lower legs. Pt was seen by her PCP yesterday for the same and was told to go to urgent care.

## 2018-10-30 ENCOUNTER — Emergency Department
Admission: EM | Admit: 2018-10-30 | Discharge: 2018-10-30 | Disposition: A | Discharge status: Home / Self Care | Payer: Medicare Other | Attending: Emergency Medicine | Admitting: Emergency Medicine

## 2018-10-30 DIAGNOSIS — S93401A Sprain of unspecified ligament of right ankle, initial encounter: Secondary | ICD-10-CM

## 2018-10-30 DIAGNOSIS — R21 Rash and other nonspecific skin eruption: Secondary | ICD-10-CM

## 2018-10-30 MED ORDER — IBUPROFEN 800 MG PO TABS
800.0000 mg | ORAL_TABLET | Freq: Once | ORAL | Status: AC
  Administered 2018-10-30: 800 mg via ORAL
  Filled 2018-10-30: qty 1

## 2018-10-30 MED ORDER — TRAMADOL HCL 50 MG PO TABS: 50.0000 mg | ORAL_TABLET | Freq: Four times a day (QID) | ORAL | 0 refills | Status: DC | PRN

## 2018-10-30 MED ORDER — IBUPROFEN 800 MG PO TABS: 800.0000 mg | ORAL_TABLET | Freq: Three times a day (TID) | ORAL | 0 refills | Status: AC | PRN

## 2018-10-30 MED ORDER — HYDROCODONE-ACETAMINOPHEN 5-325 MG PO TABS
1.0000 | ORAL_TABLET | Freq: Once | ORAL | Status: AC
  Administered 2018-10-30: 02:00:00 1 via ORAL
  Filled 2018-10-30: qty 1

## 2018-10-30 NOTE — Discharge Instructions (Signed)
1.  You may take pain medicines as needed (Motrin/Ultram #15). 2.  You may remove ankle brace at any time. 3.  Return to the ER for worsening symptoms or other concerns.
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
## 2018-10-30 NOTE — ED Notes (Signed)
Pt updated on wait time. Pt verbalizes understanding. Pt ambulatory around lobby and to outside without difficulty.

## 2018-11-05 ENCOUNTER — Other Ambulatory Visit: Payer: Self-pay

## 2018-11-05 ENCOUNTER — Encounter: Payer: Self-pay | Admitting: Intensive Care

## 2018-11-05 ENCOUNTER — Emergency Department
Admission: EM | Admit: 2018-11-05 | Discharge: 2018-11-05 | Disposition: A | Discharge status: Home / Self Care | Payer: Medicare Other | Attending: Emergency Medicine | Admitting: Emergency Medicine

## 2018-11-05 DIAGNOSIS — Z79899 Other long term (current) drug therapy: Secondary | ICD-10-CM | POA: Diagnosis not present

## 2018-11-05 DIAGNOSIS — R21 Rash and other nonspecific skin eruption: Secondary | ICD-10-CM | POA: Insufficient documentation

## 2018-11-05 DIAGNOSIS — E119 Type 2 diabetes mellitus without complications: Secondary | ICD-10-CM | POA: Diagnosis not present

## 2018-11-05 DIAGNOSIS — F17210 Nicotine dependence, cigarettes, uncomplicated: Secondary | ICD-10-CM | POA: Diagnosis not present

## 2018-11-05 DIAGNOSIS — I10 Essential (primary) hypertension: Secondary | ICD-10-CM | POA: Diagnosis not present

## 2018-11-05 MED ORDER — LIDOCAINE 5 % EX PTCH: 1.0000 | MEDICATED_PATCH | Freq: Two times a day (BID) | CUTANEOUS | 0 refills | Status: AC

## 2018-11-05 MED ORDER — LIDOCAINE 5 % EX PTCH
1.0000 | MEDICATED_PATCH | CUTANEOUS | Status: DC
  Administered 2018-11-05: 1 via TRANSDERMAL
  Filled 2018-11-05: qty 1

## 2018-11-05 NOTE — ED Notes (Signed)
Called x1

## 2018-11-05 NOTE — Discharge Instructions (Addendum)
Patient advised to follow-up with dermatology for definitive evaluation and treatment.  Continue previous medication use Derm patches as directed.

## 2018-11-05 NOTE — ED Provider Notes (Signed)
[facility] Emergency Department Provider Note   ____________________________________________   First MD Initiated Contact with Patient 11/05/18 1107     (approximate)  I have reviewed the triage vital signs and the nursing notes.   HISTORY  Chief Complaint Rash    HPI Victoria Frey is a 58 y.o. female patient complain of painful rash to bilateral shins.  Patient appears to be in a manic phase.  Patient was seen recently for same  Patient talked to her dermatologist today. Reviewed note from dermatologist revealing patient was in a manic phase when discussing her compliant.  Patient stated she had a biopsy done of the lesion on her leg but did not know the results.  Reviewed dermatology notes state benign lesion per biopsy.  Patient adamant that a more serious condition for her painful rash.  Patient has 5 prescription for hydrocodone any 1 pills.  Last prescriptions written yesterday by PCP 21 tablets.  Patient rates the pain as 8/10.  Patient's chronic pain as "burning".     Past Medical History:  Diagnosis Date   Bipolar 1 disorder ([place])    Chronic pain    Depression    Diabetes mellitus without complication ([place])    Drug abuse ([place])    Hypertension     Patient Active Problem List   Diagnosis Date Noted   Bipolar disorder, now depressed ([place]) 08/30/2014   Cocaine abuse ([place]) 08/30/2014    Past Surgical History:  Procedure Laterality Date   BACK SURGERY     CARPAL TUNNEL RELEASE     SKIN CANCER EXCISION     TUMOR REMOVAL      Prior to Admission medications   Medication Sig Start Date End Date Taking? Authorizing Provider  amLODipine (NORVASC) 10 MG tablet Take 10 mg by mouth daily.    [provider]  FLUoxetine (PROZAC) 10 MG tablet Take 10 mg by mouth daily.    [provider]  gabapentin (NEURONTIN) 600 MG tablet Take 600 mg by mouth 3 (three) times daily.    [provider]    HYDROcodone-acetaminophen (NORCO/VICODIN) 5-325 MG tablet Take 1 tablet by mouth every 6 (six) hours as needed for moderate pain. 10/15/18   Johnn Hai, PA-C  hydrOXYzine (ATARAX/VISTARIL) 50 MG tablet Take 50 mg by mouth 3 (three) times daily as needed.    [provider]  ibuprofen (ADVIL) 800 MG tablet Take 1 tablet (800 mg total) by mouth every 8 (eight) hours as needed for moderate pain. 10/30/18   Paulette Blanch, MD  lidocaine (LIDODERM) 5 % Place 1 patch onto the skin every 12 (twelve) hours. Remove & Discard patch within 12 hours or as directed by MD 11/05/18 11/05/19  Sable Feil, PA-C  lidocaine (LIDODERM) 5 % Place 1 patch onto the skin every 12 (twelve) hours. Remove & Discard patch within 12 hours or as directed by MD 11/05/18 11/05/19  Sable Feil, PA-C  lip balm (CARMEX) ointment Apply 1 application topically as needed for lip care. 06/28/14   Ruffian, III Luanna Cole, PA-C  lisinopril (PRINIVIL,ZESTRIL) 20 MG tablet Take 20 mg by mouth daily.    [provider]  QUEtiapine (SEROQUEL) 100 MG tablet Take 100 mg by mouth at bedtime.    [provider]  traMADol (ULTRAM) 50 MG tablet Take 1 tablet (50 mg total) by mouth every 6 (six) hours as needed. 10/30/18   Paulette Blanch, MD    Allergies Patient has  no known allergies.  Family History  Problem Relation Age of Onset   Breast cancer Sister 67    Social History Social History   Tobacco Use   Smoking status: Current Every Day Smoker    Types: Cigarettes   Smokeless tobacco: Never Used  Substance Use Topics   Alcohol use: Yes    Comment: occ   Drug use: Yes    Types: Cocaine    Comment: reports prescribed hydrocodone    Review of Systems  Constitutional: No fever/chills Eyes: No visual changes. ENT: No sore throat. Cardiovascular: Denies chest pain. Respiratory: Denies shortness of breath. Gastrointestinal: No abdominal pain.  No nausea, no vomiting.  No diarrhea.  No  constipation. Genitourinary: Negative for dysuria. Musculoskeletal: Negative for back pain. Skin: Positive for rash. Neurological: Negative for headaches, focal weakness or numbness. Psychiatric:  Bipolar and depression. Endocrine:  Diabetes and hypertension.  ____________________________________________   PHYSICAL EXAM:  VITAL SIGNS: ED Triage Vitals  Enc Vitals Group     BP 11/05/18 1102 (!) 157/80     Pulse Rate 11/05/18 1102 97     Resp 11/05/18 1102 18     Temp 11/05/18 1102 98.5 F (36.9 C)     Temp Source 11/05/18 1102 Oral     SpO2 11/05/18 1102 94 %     Weight 11/05/18 1103 185 lb (83.9 kg)     Height 11/05/18 1103 5\' 7"  (1.702 m)     Head Circumference --      Peak Flow --      Pain Score 11/05/18 1102 8     Pain Loc --      Pain Edu? --      Excl. in [place]? --     Constitutional: Alert and oriented. Well appearing and in no acute distress.  Manic state. Hematological/Lymphatic/Immunilogical: No cervical lymphadenopathy. Cardiovascular: Normal rate, regular rhythm. Grossly normal heart sounds.  Good peripheral circulation.  Better blood pressure. Respiratory: Normal respiratory effort.  No retractions. Lungs CTAB. Musculoskeletal: No lower extremity tenderness nor edema.  No joint effusions. Neurologic:  Normal speech and language. No gross focal neurologic deficits are appreciated. No gait instability. Skin:  Skin is warm, dry and intact.  Scattered macular lesion bilateral lower leg. Psychiatric: Mood and affect are normal. Speech and behavior are normal.  ____________________________________________   LABS (all labs ordered are listed, but only abnormal results are displayed)  Labs Reviewed - No data to display ____________________________________________  EKG   ____________________________________________  RADIOLOGY  ED MD interpretation:    Official radiology report(s): No results  found.  ____________________________________________   PROCEDURES  Procedure(s) performed (including Critical Care):  Procedures   ____________________________________________   INITIAL IMPRESSION / ASSESSMENT AND PLAN / ED COURSE  As part of my medical decision making, I reviewed the following data within the [place] was evaluated in Emergency Department on 11/05/2018 for the symptoms described in the history of present illness. She was evaluated in the context of the global COVID-19 pandemic, which necessitated consideration that the patient might be at risk for infection with the SARS-CoV-2 virus that causes COVID-19. Institutional protocols and algorithms that pertain to the evaluation of patients at risk for COVID-19 are in a state of rapid change based on information released by regulatory bodies including the CDC and federal and state organizations. These policies and algorithms were followed during the patient's care in the ED.  Patient presents with  painful rash.  Patient is in a manic state and physical exam was grossly unremarkable except for scattered macular lesions bilateral lower leg.  Advised patient she has adequate pain medication.  Patient was offered and accepted a prescription for Lidoderm patches.  Patient advised to follow-up with dermatology.      ____________________________________________   FINAL CLINICAL IMPRESSION(S) / ED DIAGNOSES  Final diagnoses:  Rash and nonspecific skin eruption     ED Discharge Orders         Ordered    lidocaine (LIDODERM) 5 %  Every 12 hours     11/05/18 1134    lidocaine (LIDODERM) 5 %  Every 12 hours     11/05/18 1200           Note:  This document was prepared using Dragon voice recognition software and may include unintentional dictation errors.    Sable Feil, PA-C 11/05/18 1214    Duffy Bruce, MD 11/05/18 [phone]
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
## 2018-11-05 NOTE — ED Notes (Signed)
Pt refused to sign DC at this time. Pt provided with DC documents, verbalizes understanding of DC and follow-up appointments.

## 2018-11-05 NOTE — ED Notes (Signed)
ED Provider Smith at bedside. 

## 2018-11-05 NOTE — ED Notes (Signed)
First Nurse Note: Pt requesting to be seen for rash and itching. Pt is very frigidity and restless upon checking in. Pt has pressured speech.

## 2018-11-05 NOTE — ED Triage Notes (Signed)
Patient c/o rash on right shin. Was seen recently for same. Patient unable to quit talking and moving in triage.

## 2018-11-24 ENCOUNTER — Other Ambulatory Visit: Payer: Self-pay

## 2018-11-24 ENCOUNTER — Encounter: Payer: Self-pay | Admitting: Physical Therapy

## 2018-11-24 ENCOUNTER — Ambulatory Visit: Payer: Medicare Other | Attending: Family Medicine | Admitting: Physical Therapy

## 2018-11-24 DIAGNOSIS — M545 Low back pain: Secondary | ICD-10-CM | POA: Diagnosis not present

## 2018-11-24 DIAGNOSIS — M6281 Muscle weakness (generalized): Secondary | ICD-10-CM

## 2018-11-24 DIAGNOSIS — G8929 Other chronic pain: Secondary | ICD-10-CM | POA: Insufficient documentation
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
## 2018-11-25 ENCOUNTER — Encounter: Payer: Self-pay | Admitting: Physical Therapy

## 2018-11-25 NOTE — Addendum Note (Signed)
Addended by: Shelton Silvas on: 11/25/2018 01:50 PM   Modules accepted: Orders

## 2018-11-30 ENCOUNTER — Encounter: Payer: Medicare Other | Admitting: Physical Therapy

## 2018-12-02 ENCOUNTER — Ambulatory Visit: Payer: Medicare Other | Admitting: Physical Therapy

## 2018-12-03 ENCOUNTER — Encounter: Payer: Medicare Other | Admitting: Physical Therapy

## 2018-12-08 ENCOUNTER — Other Ambulatory Visit: Payer: Self-pay

## 2018-12-08 ENCOUNTER — Encounter: Payer: Medicare Other | Admitting: Physical Therapy

## 2018-12-08 ENCOUNTER — Encounter: Payer: Self-pay | Admitting: Physical Therapy

## 2018-12-08 ENCOUNTER — Ambulatory Visit: Payer: Medicare Other | Admitting: Physical Therapy

## 2018-12-08 DIAGNOSIS — G8929 Other chronic pain: Secondary | ICD-10-CM

## 2018-12-08 DIAGNOSIS — M545 Low back pain, unspecified: Secondary | ICD-10-CM

## 2018-12-08 DIAGNOSIS — M6281 Muscle weakness (generalized): Secondary | ICD-10-CM
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
## 2018-12-13 ENCOUNTER — Encounter: Payer: Medicare Other | Admitting: Physical Therapy

## 2018-12-14 ENCOUNTER — Encounter: Payer: Self-pay | Admitting: Physical Therapy

## 2018-12-14 ENCOUNTER — Other Ambulatory Visit: Payer: Self-pay

## 2018-12-14 ENCOUNTER — Ambulatory Visit: Payer: Medicare Other | Attending: Family Medicine | Admitting: Physical Therapy

## 2018-12-14 DIAGNOSIS — M545 Low back pain: Secondary | ICD-10-CM | POA: Insufficient documentation

## 2018-12-14 DIAGNOSIS — M6281 Muscle weakness (generalized): Secondary | ICD-10-CM

## 2018-12-14 DIAGNOSIS — G8929 Other chronic pain: Secondary | ICD-10-CM | POA: Diagnosis present
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
## 2018-12-15 ENCOUNTER — Encounter: Payer: Medicare Other | Admitting: Physical Therapy

## 2018-12-21 ENCOUNTER — Ambulatory Visit: Payer: Medicare Other | Admitting: Physical Therapy

## 2018-12-21 ENCOUNTER — Other Ambulatory Visit: Payer: Self-pay

## 2018-12-21 ENCOUNTER — Encounter: Payer: Self-pay | Admitting: Physical Therapy

## 2018-12-21 DIAGNOSIS — G8929 Other chronic pain: Secondary | ICD-10-CM

## 2018-12-21 DIAGNOSIS — M545 Low back pain, unspecified: Secondary | ICD-10-CM

## 2018-12-21 DIAGNOSIS — M6281 Muscle weakness (generalized): Secondary | ICD-10-CM
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
## 2018-12-29 ENCOUNTER — Encounter: Payer: Self-pay | Admitting: Physical Therapy

## 2018-12-29 ENCOUNTER — Other Ambulatory Visit: Payer: Self-pay

## 2018-12-29 ENCOUNTER — Ambulatory Visit: Payer: Medicare Other | Admitting: Physical Therapy

## 2018-12-29 DIAGNOSIS — M545 Low back pain, unspecified: Secondary | ICD-10-CM

## 2018-12-29 DIAGNOSIS — M6281 Muscle weakness (generalized): Secondary | ICD-10-CM

## 2018-12-29 DIAGNOSIS — G8929 Other chronic pain: Secondary | ICD-10-CM
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
## 2019-01-04 ENCOUNTER — Other Ambulatory Visit: Payer: Self-pay

## 2019-01-04 ENCOUNTER — Encounter: Payer: Self-pay | Admitting: Physical Therapy

## 2019-01-04 ENCOUNTER — Ambulatory Visit: Payer: Medicare Other | Admitting: Physical Therapy

## 2019-01-04 DIAGNOSIS — M545 Low back pain: Secondary | ICD-10-CM | POA: Diagnosis not present

## 2019-01-04 DIAGNOSIS — M6281 Muscle weakness (generalized): Secondary | ICD-10-CM

## 2019-01-04 DIAGNOSIS — G8929 Other chronic pain: Secondary | ICD-10-CM
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
## 2019-01-11 ENCOUNTER — Ambulatory Visit: Payer: Medicare Other | Admitting: Physical Therapy

## 2019-01-11 ENCOUNTER — Other Ambulatory Visit: Payer: Self-pay

## 2019-01-11 ENCOUNTER — Encounter: Payer: Self-pay | Admitting: Physical Therapy

## 2019-01-11 DIAGNOSIS — M545 Low back pain, unspecified: Secondary | ICD-10-CM

## 2019-01-11 DIAGNOSIS — M6281 Muscle weakness (generalized): Secondary | ICD-10-CM

## 2019-01-11 DIAGNOSIS — G8929 Other chronic pain: Secondary | ICD-10-CM
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
## 2019-01-18 ENCOUNTER — Ambulatory Visit: Payer: Medicare Other | Admitting: Physical Therapy

## 2019-02-03 ENCOUNTER — Other Ambulatory Visit: Payer: Self-pay | Admitting: Family Medicine

## 2019-02-03 DIAGNOSIS — R1901 Right upper quadrant abdominal swelling, mass and lump: Secondary | ICD-10-CM

## 2019-02-11 ENCOUNTER — Other Ambulatory Visit: Payer: Self-pay

## 2019-02-11 ENCOUNTER — Ambulatory Visit
Admission: RE | Admit: 2019-02-11 | Discharge: 2019-02-11 | Disposition: A | Discharge status: Home / Self Care | Payer: Medicare Other | Source: Ambulatory Visit | Attending: Family Medicine | Admitting: Family Medicine

## 2019-02-11 DIAGNOSIS — R1901 Right upper quadrant abdominal swelling, mass and lump: Secondary | ICD-10-CM | POA: Diagnosis not present

## 2019-05-10 ENCOUNTER — Other Ambulatory Visit: Payer: Self-pay | Admitting: Family Medicine

## 2019-05-10 DIAGNOSIS — Z1231 Encounter for screening mammogram for malignant neoplasm of breast: Secondary | ICD-10-CM

## 2019-06-09 ENCOUNTER — Ambulatory Visit
Admission: RE | Admit: 2019-06-09 | Discharge: 2019-06-09 | Disposition: A | Discharge status: Home / Self Care | Payer: Medicare Other | Source: Ambulatory Visit | Attending: Family Medicine | Admitting: Family Medicine

## 2019-06-09 DIAGNOSIS — Z1231 Encounter for screening mammogram for malignant neoplasm of breast: Secondary | ICD-10-CM

## 2020-08-20 ENCOUNTER — Encounter: Payer: Self-pay | Admitting: Family Medicine

## 2020-08-22 ENCOUNTER — Other Ambulatory Visit: Payer: Self-pay | Admitting: Family Medicine

## 2020-08-22 DIAGNOSIS — Z1231 Encounter for screening mammogram for malignant neoplasm of breast: Secondary | ICD-10-CM

## 2020-09-06 ENCOUNTER — Ambulatory Visit: Payer: Medicare Other | Admitting: Podiatry

## 2020-09-06 ENCOUNTER — Other Ambulatory Visit: Payer: Self-pay

## 2020-09-06 DIAGNOSIS — M79675 Pain in left toe(s): Secondary | ICD-10-CM

## 2020-09-06 DIAGNOSIS — M79674 Pain in right toe(s): Secondary | ICD-10-CM | POA: Diagnosis not present

## 2020-09-06 DIAGNOSIS — B351 Tinea unguium: Secondary | ICD-10-CM

## 2020-09-11 ENCOUNTER — Encounter: Payer: Self-pay | Admitting: Podiatry

## 2020-09-11 NOTE — Progress Notes (Signed)
  Subjective:  Patient ID: Victoria Frey, female    DOB: Jun 28, 1960,  MRN: [id number]  Chief Complaint  Patient presents with   Nail Problem    Nail care    60 y.o. female returns for the above complaint.  Patient presents with thickened elongated dystrophic toenails x10.  Mild pain on palpation.  Patient would like to have them debrided down she is not able to do her self.  She denies any other acute complaints.  Objective:  There were no vitals filed for this visit. Podiatric Exam: Vascular: dorsalis pedis and posterior tibial pulses are palpable bilateral. Capillary return is immediate. Temperature gradient is WNL. Skin turgor WNL  Sensorium: Normal Semmes Weinstein monofilament test. Normal tactile sensation bilaterally. Nail Exam: Pt has thick disfigured discolored nails with subungual debris noted bilateral entire nail hallux through fifth toenails.  Pain on palpation to the nails. Ulcer Exam: There is no evidence of ulcer or pre-ulcerative changes or infection. Orthopedic Exam: Muscle tone and strength are WNL. No limitations in general ROM. No crepitus or effusions noted.  Skin: No Porokeratosis. No infection or ulcers    Assessment & Plan:   1. Pain due to onychomycosis of toenails of both feet     Patient was evaluated and treated and all questions answered.  Onychomycosis with pain  -Nails palliatively debrided as below. -Educated on self-care  Procedure: Nail Debridement Rationale: pain  Type of Debridement: manual, sharp debridement. Instrumentation: Nail nipper, rotary burr. Number of Nails: 10  Procedures and Treatment: Consent by patient was obtained for treatment procedures. The patient understood the discussion of treatment and procedures well. All questions were answered thoroughly reviewed. Debridement of mycotic and hypertrophic toenails, 1 through 5 bilateral and clearing of subungual debris. No ulceration, no infection noted.  Return Visit-Office  Procedure: Patient instructed to return to the office for a follow up visit 3 months for continued evaluation and treatment.  Boneta Lucks, DPM    No follow-ups on file.

## 2020-09-13 ENCOUNTER — Other Ambulatory Visit: Payer: Self-pay

## 2020-09-13 ENCOUNTER — Ambulatory Visit
Admission: RE | Admit: 2020-09-13 | Discharge: 2020-09-13 | Disposition: A | Discharge status: Home / Self Care | Payer: Medicare Other | Source: Ambulatory Visit | Attending: Family Medicine | Admitting: Family Medicine

## 2020-09-13 DIAGNOSIS — Z1231 Encounter for screening mammogram for malignant neoplasm of breast: Secondary | ICD-10-CM | POA: Diagnosis not present

## 2020-10-15 ENCOUNTER — Encounter: Payer: Self-pay | Admitting: Ophthalmology

## 2020-10-15 ENCOUNTER — Encounter: Payer: Self-pay | Admitting: Gastroenterology

## 2020-10-15 ENCOUNTER — Ambulatory Visit: Payer: Medicare Other | Admitting: Gastroenterology

## 2020-10-15 ENCOUNTER — Other Ambulatory Visit: Payer: Self-pay

## 2020-10-15 VITALS — BP 152/83 | HR 80 | Temp 97.1°F | Wt 192.6 lb

## 2020-10-15 DIAGNOSIS — K5900 Constipation, unspecified: Secondary | ICD-10-CM | POA: Diagnosis not present

## 2020-10-15 DIAGNOSIS — K760 Fatty (change of) liver, not elsewhere classified: Secondary | ICD-10-CM

## 2020-10-15 MED ORDER — LINACLOTIDE 290 MCG PO CAPS: 290.0000 ug | ORAL_CAPSULE | Freq: Every day | ORAL | 1 refills | Status: DC

## 2020-10-15 MED ORDER — NA SULFATE-K SULFATE-MG SULF 17.5-3.13-1.6 GM/177ML PO SOLN: 1.0000 | Freq: Once | ORAL | 0 refills | Status: AC

## 2020-10-15 NOTE — Progress Notes (Signed)
Victoria Frey [address]  [place]  [place], [place] [address]  Main: [phone]  Fax: [phone]   Gastroenterology Consultation  Referring Provider:     Denton Lank, MD Primary Care Physician:  Denton Lank, MD Reason for Consultation:     Constipation        HPI:    Chief Complaint  Patient presents with   New Patient (Initial Visit)    Pt denies any family Hx she is aware of.... Sx have worsened x 3 months... Pt reports she has issues with diarrhea and constipation... averaging 2 BM per week.... denies blood in stool or N/V...     Victoria Frey is a 60 y.o. y/o female referred for consultation & management  by Dr. Denton Lank, MD. patient reports at least 63-month history of constipation where she does not have a bowel movement for 3 days straight, followed by 1 or 2 days of loose stools.  PCP notes reviewed, under scanned media, and reports that patient was recently started on lactulose.  Patient is also taking Linzess 145 MCG a day, and lactulose.  He is still not having a bowel movement for 2 to 3 days at a time.  No prior colonoscopy.  Also reports associated abdominal bloating.  Denies abdominal pain.  No nausea or vomiting.  Reports dysphagia to pills and solid foods for the last 6 months.  Does not have teeth but states that she chews her food well and is having dysphagia to her pills as well.    Patient reports that she has had weight loss and states that she is to weigh 224 pounds.  A review of her documented weights show that she was 224 pounds in 2016.  However, she weighed 182 pounds in October 2020 and is 192 pounds today.  Past Medical History:  Diagnosis Date   Bipolar 1 disorder ([facility])    Chronic pain    Depression    Diabetes mellitus without complication ([place])    Drug abuse ([place])    Hypertension     Past Surgical History:  Procedure Laterality Date   BACK SURGERY     CARPAL TUNNEL RELEASE     SKIN CANCER EXCISION     TUMOR REMOVAL       Prior to Admission medications   Medication Sig Start Date End Date Taking? Authorizing Provider  albuterol (VENTOLIN HFA) 108 (90 Base) MCG/ACT inhaler SMARTSIG:2 Inhalation By Mouth Every 4-6 Hours PRN 07/29/20  Yes [provider]  amLODipine (NORVASC) 10 MG tablet Take 10 mg by mouth daily.   Yes [provider]  budesonide-formoterol (SYMBICORT) 160-4.5 MCG/ACT inhaler Inhale into the lungs.   Yes [provider]  busPIRone (BUSPAR) 5 MG tablet Take by mouth.   Yes [provider]  FLUoxetine (PROZAC) 40 MG capsule Take 40 mg by mouth daily. 07/30/20  Yes [provider]  gabapentin (NEURONTIN) 600 MG tablet Take 600 mg by mouth 3 (three) times daily.   Yes [provider]  hydrOXYzine (ATARAX/VISTARIL) 25 MG tablet Take 25 mg by mouth 3 (three) times daily. 07/30/20  Yes [provider]  ibuprofen (ADVIL) 800 MG tablet Take 1 tablet (800 mg total) by mouth every 8 (eight) hours as needed for moderate pain. 10/30/18  Yes Paulette Blanch, MD  lactulose, encephalopathy, (CHRONULAC) 10 GM/15ML SOLN Take 10 g by mouth 2 (two) times daily. 08/02/20  Yes [provider]  LINZESS 145 MCG CAPS capsule Take 145  mcg by mouth daily. 05/28/20  Yes [provider]  lip balm (CARMEX) ointment Apply 1 application topically as needed for lip care. 06/28/14  Yes Ruffian, III William C, PA-C  losartan (COZAAR) 50 MG tablet Take 50 mg by mouth daily. 07/30/20  Yes [provider]  metFORMIN (GLUCOPHAGE) 500 MG tablet Take 500 mg by mouth 2 (two) times daily. 07/30/20  Yes [provider]  mupirocin ointment (BACTROBAN) 2 % Apply topically. 08/02/20  Yes [provider]  omeprazole (PRILOSEC) 20 MG capsule Take 20 mg by mouth daily. 08/16/20  Yes [provider]  QUEtiapine (SEROQUEL) 100 MG tablet Take 200 mg by mouth at bedtime.   Yes [provider]  QUEtiapine (SEROQUEL) 400 MG tablet Take 400  mg by mouth at bedtime. 09/21/20  Yes [provider]  rOPINIRole (REQUIP) 0.25 MG tablet Take by mouth. 09/21/20  Yes [provider]    Family History  Problem Relation Age of Onset   Breast cancer Sister 67     Social History   Tobacco Use   Smoking status: Every Day    Types: Cigarettes   Smokeless tobacco: Never  Vaping Use   Vaping Use: Never used  Substance Use Topics   Alcohol use: Yes    Comment: occ   Drug use: Yes    Types: Cocaine    Comment: reports prescribed hydrocodone    Allergies as of 10/15/2020   (No Known Allergies)    Review of Systems:    All systems reviewed and negative except where noted in HPI.   Physical Exam:  Constitutional: General:   Alert,  Well-developed, well-nourished, pleasant and cooperative in NAD BP (!) 152/83   Pulse 80   Temp (!) 97.1 F (36.2 C) (Oral)   Wt 192 lb 9.6 oz (87.4 kg)   SpO2 98%   BMI 30.17 kg/m   Eyes:  Sclera clear, no icterus.   Conjunctiva pink. PERRLA  Ears:  No scars, lesions or masses, Normal auditory acuity. Nose:  No deformity, discharge, or lesions. Mouth:  No deformity or lesions, oropharynx pink & moist.  Neck:  Supple; no masses or thyromegaly.  Respiratory: Normal respiratory effort, Normal percussion  Gastrointestinal: Soft, non-tender and non-distended without masses, hepatosplenomegaly or hernias noted.  No guarding or rebound tenderness.     Cardiac: No clubbing or edema.  No cyanosis. Normal posterior tibial pedal pulses noted.  Lymphatic:  No significant cervical or axillary adenopathy.  Psych:  Alert and cooperative. Normal mood and affect.  Musculoskeletal:  Normal gait. Head normocephalic, atraumatic. Symmetrical without gross deformities. 5/5 Upper and Lower extremity strength bilaterally.  Skin: Warm. Intact without significant lesions or rashes. No jaundice.  Neurologic:  Face symmetrical, tongue midline, Normal sensation to touch;  grossly normal  neurologically.  Psych:  Alert and oriented x3, Alert and cooperative. Normal mood and affect.   Labs: CBC    Component Value Date/Time   WBC 5.2 08/29/2014 1002   RBC 4.67 08/29/2014 1002   HGB 14.6 08/29/2014 1002   HGB 14.5 11/04/2011 1825   HCT 43.2 08/29/2014 1002   HCT 42.7 11/04/2011 1825   PLT 246 08/29/2014 1002   PLT 236 11/04/2011 1825   MCV 92.6 08/29/2014 1002   MCV 94 11/04/2011 1825   MCH 31.2 08/29/2014 1002   MCHC 33.7 08/29/2014 1002   RDW 13.4 08/29/2014 1002   RDW 13.0 11/04/2011 1825   CMP     Component Value Date/Time  NA 138 08/29/2014 1002   NA 141 11/04/2011 1825   K 3.7 08/29/2014 1002   K 4.0 11/04/2011 1825   CL 106 08/29/2014 1002   CL 107 11/04/2011 1825   CO2 23 08/29/2014 1002   CO2 27 11/04/2011 1825   GLUCOSE 177 (H) 08/29/2014 1002   GLUCOSE 104 (H) 11/04/2011 1825   BUN 10 08/29/2014 1002   BUN 11 11/04/2011 1825   CREATININE 1.03 (H) 08/29/2014 1002   CREATININE 0.67 11/04/2011 1825   CALCIUM 8.9 08/29/2014 1002   CALCIUM 8.7 11/04/2011 1825   PROT 7.5 08/29/2014 1002   ALBUMIN 4.3 08/29/2014 1002   AST 37 08/29/2014 1002   ALT 37 08/29/2014 1002   ALKPHOS 81 08/29/2014 1002   BILITOT 0.5 08/29/2014 1002   GFRNONAA >60 08/29/2014 1002   GFRNONAA >60 11/04/2011 1825   GFRAA >60 08/29/2014 1002   GFRAA >60 11/04/2011 1825   Scan labs from August 2022 reviewed and show mildly elevated ALT, and otherwise normal liver enzymes, hemoglobin and platelets.  Imaging Studies: January 2021 abdominal ultrasound reported fatty liver  Assessment and Plan:   Victoria Frey is a 60 y.o. y/o female has been referred for constipation  Patient continues to have constipation despite a combination of Linzess and lactulose.  The loose stools that she is having intermittently may be due to overflow diarrhea.  However, she has never had a screening colonoscopy and is agreeable to scheduling this.  We can obtain biopsies for microscopic  colitis during the procedure, however diarrhea is more likely due to her constipation and overflow diarrhea than microscopic colitis itself  EGD also indicated for dysphagia  Will increase Linzess to 38 MCG a day  Review of her labs show that her ALT was elevated in the 50s.  2021 ultrasound showed fatty liver  Will obtain fatty liver work-up as well  Finding of fatty liver on imaging discussed with patient Diet, weight loss, and exercise encouraged along with avoiding hepatotoxic drugs including alcohol Risk of progression to cirrhosis if above measures are not instituted were discussed as well, and patient verbalized understanding  I have discussed alternative options, risks & benefits,  which include, but are not limited to, bleeding, infection, perforation,respiratory complication & drug reaction.  The patient agrees with this plan & written consent will be obtained.       Dr Victoria Frey  Speech recognition software was used to dictate the above note.

## 2020-10-21 LAB — HEPATIC FUNCTION PANEL
ALT: 25 IU/L (ref 0–32)
AST: 24 IU/L (ref 0–40)
Albumin: 4.8 g/dL (ref 3.8–4.8)
Alkaline Phosphatase: 95 IU/L (ref 44–121)
Bilirubin Total: 0.2 mg/dL (ref 0.0–1.2)
Bilirubin, Direct: <0.1 mg/dL (ref 0.00–0.40)
Total Protein: 7.5 g/dL (ref 6.0–8.5)

## 2020-10-21 LAB — ANA: ANA Titer 1: NEGATIVE

## 2020-10-21 LAB — MITOCHONDRIAL/SMOOTH MUSCLE AB PNL
Mitochondrial Ab: <20 Units (ref 0.0–20.0)
Smooth Muscle Ab: 6 Units (ref 0–19)

## 2020-10-21 LAB — HEPATITIS B SURFACE ANTIBODY,QUALITATIVE: Hep B Surface Ab, Qual: NONREACTIVE

## 2020-10-21 LAB — HEPATITIS B CORE ANTIBODY, TOTAL: Hep B Core Total Ab: NEGATIVE

## 2020-10-21 LAB — IRON AND TIBC
Iron Saturation: 22 % (ref 15–55)
Iron: 74 ug/dL (ref 27–139)
Total Iron Binding Capacity: 334 ug/dL (ref 250–450)
UIBC: 260 ug/dL (ref 118–369)

## 2020-10-21 LAB — HEPATITIS A ANTIBODY, TOTAL: hep A Total Ab: NEGATIVE

## 2020-10-21 LAB — HEPATITIS C ANTIBODY: Hep C Virus Ab: <0.1 s/co ratio (ref 0.0–0.9)

## 2020-10-21 LAB — ANTI-MICROSOMAL ANTIBODY LIVER / KIDNEY: LKM1 Ab: 0.7 Units (ref 0.0–20.0)

## 2020-10-21 LAB — HEPATITIS B SURFACE ANTIGEN: Hepatitis B Surface Ag: NEGATIVE

## 2020-10-21 LAB — IGG: IgG (Immunoglobin G), Serum: 1050 mg/dL (ref 586–1602)

## 2020-10-21 LAB — CERULOPLASMIN: Ceruloplasmin: 29.2 mg/dL (ref 19.0–39.0)

## 2020-10-21 LAB — FERRITIN: Ferritin: 47 ng/mL (ref 15–150)

## 2020-10-23 ENCOUNTER — Ambulatory Visit
Admission: RE | Admit: 2020-10-23 | Discharge: 2020-10-23 | Disposition: A | Discharge status: Home / Self Care | Payer: Medicare Other | Attending: Gastroenterology | Admitting: Gastroenterology

## 2020-10-23 ENCOUNTER — Ambulatory Visit: Payer: Medicare Other | Admitting: Anesthesiology

## 2020-10-23 ENCOUNTER — Encounter
Admission: RE | Disposition: A | Discharge status: Home / Self Care | Payer: Self-pay | Source: Home / Self Care | Attending: Gastroenterology

## 2020-10-23 ENCOUNTER — Other Ambulatory Visit: Payer: Self-pay

## 2020-10-23 ENCOUNTER — Encounter: Payer: Self-pay | Admitting: Gastroenterology

## 2020-10-23 DIAGNOSIS — D123 Benign neoplasm of transverse colon: Secondary | ICD-10-CM | POA: Diagnosis not present

## 2020-10-23 DIAGNOSIS — R1319 Other dysphagia: Secondary | ICD-10-CM | POA: Diagnosis not present

## 2020-10-23 DIAGNOSIS — F17210 Nicotine dependence, cigarettes, uncomplicated: Secondary | ICD-10-CM | POA: Diagnosis not present

## 2020-10-23 DIAGNOSIS — D124 Benign neoplasm of descending colon: Secondary | ICD-10-CM | POA: Insufficient documentation

## 2020-10-23 DIAGNOSIS — R1310 Dysphagia, unspecified: Secondary | ICD-10-CM | POA: Diagnosis not present

## 2020-10-23 DIAGNOSIS — K635 Polyp of colon: Secondary | ICD-10-CM

## 2020-10-23 DIAGNOSIS — Z7984 Long term (current) use of oral hypoglycemic drugs: Secondary | ICD-10-CM | POA: Diagnosis not present

## 2020-10-23 DIAGNOSIS — D125 Benign neoplasm of sigmoid colon: Secondary | ICD-10-CM | POA: Insufficient documentation

## 2020-10-23 DIAGNOSIS — K449 Diaphragmatic hernia without obstruction or gangrene: Secondary | ICD-10-CM | POA: Insufficient documentation

## 2020-10-23 DIAGNOSIS — K222 Esophageal obstruction: Secondary | ICD-10-CM | POA: Diagnosis not present

## 2020-10-23 DIAGNOSIS — Z7951 Long term (current) use of inhaled steroids: Secondary | ICD-10-CM | POA: Diagnosis not present

## 2020-10-23 DIAGNOSIS — K5900 Constipation, unspecified: Secondary | ICD-10-CM

## 2020-10-23 DIAGNOSIS — Z1211 Encounter for screening for malignant neoplasm of colon: Secondary | ICD-10-CM | POA: Diagnosis not present

## 2020-10-23 DIAGNOSIS — K52839 Microscopic colitis, unspecified: Secondary | ICD-10-CM | POA: Diagnosis not present

## 2020-10-23 DIAGNOSIS — Z79899 Other long term (current) drug therapy: Secondary | ICD-10-CM | POA: Diagnosis not present

## 2020-10-23 HISTORY — DX: Chronic obstructive pulmonary disease, unspecified: J44.9

## 2020-10-23 HISTORY — PX: ESOPHAGOGASTRODUODENOSCOPY (EGD) WITH PROPOFOL: SHX5813

## 2020-10-23 HISTORY — PX: ESOPHAGEAL DILATION: SHX303

## 2020-10-23 HISTORY — PX: POLYPECTOMY: SHX5525

## 2020-10-23 HISTORY — PX: COLONOSCOPY WITH PROPOFOL: SHX5780

## 2020-10-23 LAB — GLUCOSE, CAPILLARY
Glucose-Capillary: 106 mg/dL — ABNORMAL HIGH (ref 70–99)
Glucose-Capillary: 108 mg/dL — ABNORMAL HIGH (ref 70–99)

## 2020-10-23 SURGERY — COLONOSCOPY WITH PROPOFOL
Anesthesia: General | Site: Rectum

## 2020-10-23 MED ORDER — DEXMEDETOMIDINE (PRECEDEX) IN NS 20 MCG/5ML (4 MCG/ML) IV SYRINGE
PREFILLED_SYRINGE | INTRAVENOUS | Status: DC | PRN
  Administered 2020-10-23: 20 ug via INTRAVENOUS
  Administered 2020-10-23: 10 ug via INTRAVENOUS

## 2020-10-23 MED ORDER — GLYCOPYRROLATE 0.2 MG/ML IJ SOLN
INTRAMUSCULAR | Status: DC | PRN
Start: 2020-10-23 — End: 2020-10-23
  Administered 2020-10-23: .2 mg via INTRAVENOUS

## 2020-10-23 MED ORDER — LACTATED RINGERS IV SOLN: INTRAVENOUS | Status: DC

## 2020-10-23 MED ORDER — SODIUM CHLORIDE 0.9 % IV SOLN: INTRAVENOUS | Status: DC

## 2020-10-23 MED ORDER — ACETAMINOPHEN 160 MG/5ML PO SOLN: 325.0000 mg | Freq: Once | ORAL | Status: DC

## 2020-10-23 MED ORDER — ACETAMINOPHEN 325 MG PO TABS: 325.0000 mg | ORAL_TABLET | Freq: Once | ORAL | Status: DC

## 2020-10-23 MED ORDER — PROPOFOL 10 MG/ML IV BOLUS
INTRAVENOUS | Status: DC | PRN
  Administered 2020-10-23: 30 mg via INTRAVENOUS
  Administered 2020-10-23 (×2): 70 mg via INTRAVENOUS
  Administered 2020-10-23: 50 mg via INTRAVENOUS
  Administered 2020-10-23 (×2): 60 mg via INTRAVENOUS
  Administered 2020-10-23: 50 mg via INTRAVENOUS
  Administered 2020-10-23 (×2): 40 mg via INTRAVENOUS
  Administered 2020-10-23: 50 mg via INTRAVENOUS
  Administered 2020-10-23: 60 mg via INTRAVENOUS

## 2020-10-23 MED ORDER — SODIUM CHLORIDE 0.9 % IR SOLN
Status: DC | PRN
  Administered 2020-10-23: 75 mL

## 2020-10-23 MED ORDER — LIDOCAINE HCL (CARDIAC) PF 100 MG/5ML IV SOSY
PREFILLED_SYRINGE | INTRAVENOUS | Status: DC | PRN
  Administered 2020-10-23: 40 mg via INTRAVENOUS

## 2020-10-23 SURGICAL SUPPLY — 41 items
BALLN DILATOR 10-12 8 (BALLOONS)
BALLN DILATOR 12-15 8 (BALLOONS)
BALLN DILATOR 15-18 8 (BALLOONS)
BALLN DILATOR CRE 0-12 8 (BALLOONS)
BALLN DILATOR CRE 18-20 240 (BALLOONS) ×4 IMPLANT
BALLN DILATOR ESOPH 8 10 CRE (MISCELLANEOUS) IMPLANT
BALLOON DILATOR 12-15 8 (BALLOONS) IMPLANT
BALLOON DILATOR 15-18 8 (BALLOONS) IMPLANT
BALLOON DILATOR CRE 0-12 8 (BALLOONS) IMPLANT
BLOCK BITE 60FR ADLT L/F GRN (MISCELLANEOUS) ×4 IMPLANT
CLIP HMST 235XBRD CATH ROT (MISCELLANEOUS) IMPLANT
CLIP RESOLUTION 360 11X235 (MISCELLANEOUS)
ELECT REM PT RETURN 9FT ADLT (ELECTROSURGICAL)
ELECTRODE REM PT RTRN 9FT ADLT (ELECTROSURGICAL) IMPLANT
FCP ESCP3.2XJMB 240X2.8X (MISCELLANEOUS)
FORCEPS BIOP RAD 4 LRG CAP 4 (CUTTING FORCEPS) ×4 IMPLANT
FORCEPS BIOP RJ4 240 W/NDL (MISCELLANEOUS)
FORCEPS ESCP3.2XJMB 240X2.8X (MISCELLANEOUS) IMPLANT
GOWN CVR UNV OPN BCK APRN NK (MISCELLANEOUS) ×12 IMPLANT
GOWN ISOL THUMB LOOP REG UNIV (MISCELLANEOUS) ×16
INJECTOR VARIJECT VIN23 (MISCELLANEOUS) IMPLANT
KIT DEFENDO VALVE AND CONN (KITS) IMPLANT
KIT PRC NS LF DISP ENDO (KITS) ×6 IMPLANT
KIT PROCEDURE OLYMPUS (KITS) ×8
MANIFOLD NEPTUNE II (INSTRUMENTS) ×8 IMPLANT
MARKER SPOT ENDO TATTOO 5ML (MISCELLANEOUS) IMPLANT
NEEDLE CARR LOCKE SCLERO (NEEDLE) ×4 IMPLANT
PROBE APC STR FIRE (PROBE) IMPLANT
RETRIEVER NET PLAT FOOD (MISCELLANEOUS) IMPLANT
RETRIEVER NET ROTH 2.5X230 LF (MISCELLANEOUS) IMPLANT
SNARE COLD EXACTO (MISCELLANEOUS) ×4 IMPLANT
SNARE LASSO HEX 3 IN 1 (INSTRUMENTS) ×4 IMPLANT
SNARE SHORT THROW 13M SML OVAL (MISCELLANEOUS) IMPLANT
SNARE SHORT THROW 30M LRG OVAL (MISCELLANEOUS) IMPLANT
SNARE SNG USE RND 15MM (INSTRUMENTS) IMPLANT
SPOT EX ENDOSCOPIC TATTOO (MISCELLANEOUS)
SYR INFLATION 60ML (SYRINGE) ×4 IMPLANT
TRAP ETRAP POLY (MISCELLANEOUS) ×4 IMPLANT
VARIJECT INJECTOR VIN23 (MISCELLANEOUS)
WATER STERILE IRR 250ML POUR (IV SOLUTION) ×8 IMPLANT
WIRE CRE 18-20MM 8CM F G (MISCELLANEOUS) IMPLANT

## 2020-10-23 NOTE — H&P (Signed)
Vonda Antigua, MD [address] [place], [place], [place], [address] [address], [place], [place], [place], [address] Phone: [phone]  Fax: [phone]  Primary Care Physician:  Denton Lank, MD   Pre-Procedure History & Physical: HPI:  Victoria Frey is a 60 y.o. female is here for a colonoscopy and EGD.   Past Medical History:  Diagnosis Date   Bipolar 1 disorder ([place])    Chronic pain    COPD (chronic obstructive pulmonary disease) ([facility])    Depression    Diabetes mellitus without complication ([place])    Drug abuse ([facility])    Hypertension     Past Surgical History:  Procedure Laterality Date   BACK SURGERY     CARPAL TUNNEL RELEASE     SKIN CANCER EXCISION     TUMOR REMOVAL      Prior to Admission medications   Medication Sig Start Date End Date Taking? Authorizing Provider  albuterol (VENTOLIN HFA) 108 (90 Base) MCG/ACT inhaler SMARTSIG:2 Inhalation By Mouth Every 4-6 Hours PRN 07/29/20  Yes [provider]  amLODipine (NORVASC) 10 MG tablet Take 10 mg by mouth daily.   Yes [provider]  budesonide-formoterol (SYMBICORT) 160-4.5 MCG/ACT inhaler Inhale into the lungs.   Yes [provider]  busPIRone (BUSPAR) 5 MG tablet Take by mouth.   Yes [provider]  FLUoxetine (PROZAC) 40 MG capsule Take 40 mg by mouth daily. 07/30/20  Yes [provider]  gabapentin (NEURONTIN) 600 MG tablet Take 600 mg by mouth 3 (three) times daily.   Yes [provider]  hydrOXYzine (ATARAX/VISTARIL) 25 MG tablet Take 25 mg by mouth 3 (three) times daily. 07/30/20  Yes [provider]  ibuprofen (ADVIL) 800 MG tablet Take 1 tablet (800 mg total) by mouth every 8 (eight) hours as needed for moderate pain. 10/30/18  Yes Paulette Blanch, MD  lactulose, encephalopathy, (CHRONULAC) 10 GM/15ML SOLN Take 10 g by mouth 2 (two) times daily. 08/02/20  Yes [provider]  linaclotide Rolan Lipa) 290 MCG CAPS capsule Take 1  capsule (290 mcg total) by mouth daily before breakfast. 10/15/20  Yes Virgel Manifold, MD  lip balm (CARMEX) ointment Apply 1 application topically as needed for lip care. 06/28/14  Yes Ruffian, III William C, PA-C  losartan (COZAAR) 50 MG tablet Take 50 mg by mouth daily. 07/30/20  Yes [provider]  metFORMIN (GLUCOPHAGE) 500 MG tablet Take 500 mg by mouth 2 (two) times daily. 07/30/20  Yes [provider]  mupirocin ointment (BACTROBAN) 2 % Apply topically. 08/02/20  Yes [provider]  omeprazole (PRILOSEC) 20 MG capsule Take 20 mg by mouth daily. 08/16/20  Yes [provider]  QUEtiapine (SEROQUEL) 100 MG tablet Take 200 mg by mouth at bedtime.   Yes [provider]  QUEtiapine (SEROQUEL) 400 MG tablet Take 400 mg by mouth at bedtime. 09/21/20  Yes [provider]  rOPINIRole (REQUIP) 0.25 MG tablet Take by mouth. 09/21/20  Yes [provider]    Allergies as of 10/15/2020   (No Known Allergies)    Family History  Problem Relation Age of Onset   Breast cancer Sister 98    Social History   Socioeconomic History   Marital status: Widowed    Spouse name: Not on file   Number of children: Not on file   Years of education: Not on file   Highest education level: Not on file  Occupational History   Not on file  Tobacco Use   Smoking status: Every Day    Packs/day: 1.00    Years: 45.00    Pack years: 45.00    Types: Cigarettes   Smokeless tobacco: Never   Tobacco comments:    Started as teenager  Vaping Use   Vaping Use: Never used  Substance and Sexual Activity   Alcohol use: Yes    Comment: occ   Drug use: Yes    Types: Cocaine    Comment: reports prescribed hydrocodone   Sexual activity: Yes    Birth control/protection: None  Other Topics Concern   Not on file  Social History Narrative   Not on file   Social Determinants of Health   Financial Resource Strain: Not on file  Food Insecurity: Not on file   Transportation Needs: Not on file  Physical Activity: Not on file  Stress: Not on file  Social Connections: Not on file  Intimate Partner Violence: Not on file    Review of Systems: See HPI, otherwise negative ROS  Constitutional: General:   Alert,  Well-developed, well-nourished, pleasant and cooperative in NAD BP (!) 169/92   Pulse 73   Temp 98.4 F (36.9 C) (Temporal)   Resp 16   Ht 5\' 7"  (1.702 m)   Wt 86.2 kg   SpO2 95%   BMI 29.76 kg/m   Head: Normocephalic, atraumatic.   Eyes:  Sclera clear, no icterus.   Conjunctiva pink.   Mouth:  No deformity or lesions, oropharynx pink & moist.  Neck:  Supple, trachea midline  Respiratory: Normal respiratory effort  Gastrointestinal:  Soft, non-tender and non-distended without masses, hepatosplenomegaly or hernias noted.  No guarding or rebound tenderness.     Cardiac: No clubbing or edema.  No cyanosis. Normal posterior tibial pedal pulses noted.  Lymphatic:  No significant cervical adenopathy.  Psych:  Alert and cooperative. Normal mood and affect.  Musculoskeletal:   Symmetrical without gross deformities. 5/5 Lower extremity strength bilaterally.  Skin: Warm. Intact without significant lesions or rashes. No jaundice.  Neurologic:  Face symmetrical, tongue midline, Normal sensation to touch;  grossly normal neurologically.  Psych:  Alert and oriented x3, Alert and cooperative. Normal mood and affect.  Impression/Plan: Victoria Frey is here for a colonoscopy to be performed for average risk screening and EGD for dysphagia  Risks, benefits, limitations, and alternatives regarding the procedures have been reviewed with the patient.  Questions have been answered.  All parties agreeable.   Virgel Manifold, MD  10/23/2020, 8:42 AM

## 2020-10-23 NOTE — Op Note (Signed)
[facility] Gastroenterology Patient Name: Victoria Frey Procedure Date: 10/23/2020 8:50 AM MRN: [id number] Account #: [id number] Date of Birth: 1960/11/03 Admit Type: Outpatient Age: 60 Room: [facility] OR ROOM 1 Gender: Female Note Status: Finalized Instrument Name: [id number] Procedure:             Upper GI endoscopy Indications:           Dysphagia Providers:             Robben Jagiello B. Bonna Gains MD, MD Referring MD:          Denton Lank MD, MD (Referring MD) Medicines:             Monitored Anesthesia Care Complications:         No immediate complications. Procedure:             Pre-Anesthesia Assessment:                        - Prior to the procedure, a History and Physical was                         performed, and patient medications, allergies and                         sensitivities were reviewed. The patient's tolerance                         of previous anesthesia was reviewed.                        - The risks and benefits of the procedure and the                         sedation options and risks were discussed with the                         patient. All questions were answered and informed                         consent was obtained.                        - Patient identification and proposed procedure were                         verified prior to the procedure by the physician, the                         nurse, the anesthesiologist, the anesthetist and the                         technician. The procedure was verified in the                         procedure room.                        - ASA Grade Assessment: II - A patient with mild  systemic disease.                        After obtaining informed consent, the endoscope was                         passed under direct vision. Throughout the procedure,                         the patient's blood pressure, pulse, and oxygen                         saturations were monitored  continuously. The Endoscope                         was introduced through the mouth, and advanced to the                         second part of duodenum. The upper GI endoscopy was                         accomplished with ease. The patient tolerated the                         procedure well. Findings:      A mild Schatzki ring was found at the gastroesophageal junction. A TTS       dilator was passed through the scope. Dilation with an 18-19-20 mm       balloon dilator was performed to 20 mm. The dilation site was examined       and showed complete resolution of luminal narrowing.      The exam of the esophagus was otherwise normal.      A small hiatal hernia was present.      The exam of the stomach was otherwise normal.      The duodenal bulb, second portion of the duodenum and examined duodenum       were normal. Impression:            - Mild Schatzki ring. Dilated.                        - Small hiatal hernia.                        - Normal duodenal bulb, second portion of the duodenum                         and examined duodenum.                        - No specimens collected. Recommendation:        - Await pathology results.                        - Take prescribed proton pump inhibitor or H2 blocker                         (antacid) medications 30 - 60 minutes before meals.                        -  Follow an antireflux regimen.                        - Discharge patient to home (with escort).                        - Advance diet as tolerated.                        - Continue present medications.                        - Patient has a contact number available for                         emergencies. The signs and symptoms of potential                         delayed complications were discussed with the patient.                         Return to normal activities tomorrow. Written                         discharge instructions were provided to the patient.                         - Discharge patient to home (with escort).                        - The findings and recommendations were discussed with                         the patient.                        - The findings and recommendations were discussed with                         the patient's family. Procedure Code(s):     --- Professional ---                        [phone], Esophagogastroduodenoscopy, flexible,                         transoral; with transendoscopic balloon dilation of                         esophagus (less than 30 mm diameter) Diagnosis Code(s):     --- Professional ---                        K22.2, Esophageal obstruction                        K44.9, Diaphragmatic hernia without obstruction or                         gangrene                        R13.10, Dysphagia, unspecified CPT  copyright 2019 American Medical Association. All rights reserved. The codes documented in this report are preliminary and upon coder review may  be revised to meet current compliance requirements.  Vonda Antigua, MD Margretta Sidle B. Bonna Gains MD, MD 10/23/2020 9:15:19 AM This report has been signed electronically. Number of Addenda: 0 Note Initiated On: 10/23/2020 8:50 AM Total Procedure Duration: 0 hours 11 minutes 51 seconds  Estimated Blood Loss:  Estimated blood loss: none.      [facility]

## 2020-10-23 NOTE — Anesthesia Postprocedure Evaluation (Signed)
Anesthesia Post Note  Patient: Victoria Frey  Procedure(s) Performed: COLONOSCOPY WITH PROPOFOL (Rectum) POLYPECTOMY (Rectum) ESOPHAGOGASTRODUODENOSCOPY (EGD) WITH PROPOFOL ESOPHAGEAL DILATION (Esophagus)     Patient location during evaluation: PACU Anesthesia Type: General Level of consciousness: awake and alert and oriented Pain management: satisfactory to patient Vital Signs Assessment: post-procedure vital signs reviewed and stable Respiratory status: spontaneous breathing, nonlabored ventilation and respiratory function stable Cardiovascular status: blood pressure returned to baseline and stable Postop Assessment: Adequate PO intake and No signs of nausea or vomiting Anesthetic complications: no   No notable events documented.  Raliegh Ip

## 2020-10-23 NOTE — Anesthesia Preprocedure Evaluation (Signed)
Anesthesia Evaluation  Patient identified by MRN, date of birth, ID band Patient awake    Reviewed: Allergy & Precautions, H&P , NPO status , Patient's Chart, lab work & pertinent test results  Airway Mallampati: II  TM Distance: >3 FB Neck ROM: full    Dental no notable dental hx.    Pulmonary COPD,  COPD inhaler, Current SmokerPatient did not abstain from smoking.,    Pulmonary exam normal breath sounds clear to auscultation       Cardiovascular hypertension, Normal cardiovascular exam Rhythm:regular Rate:Normal     Neuro/Psych PSYCHIATRIC DISORDERS Depression Bipolar Disorder    GI/Hepatic   Endo/Other  diabetes, Type 2  Renal/GU      Musculoskeletal   Abdominal   Peds  Hematology   Anesthesia Other Findings   Reproductive/Obstetrics                             Anesthesia Physical Anesthesia Plan  ASA: 3  Anesthesia Plan: General   Post-op Pain Management:    Induction: Intravenous  PONV Risk Score and Plan: 3 and Treatment may vary due to age or medical condition, TIVA and Propofol infusion  Airway Management Planned: Natural Airway  Additional Equipment:   Intra-op Plan:   Post-operative Plan:   Informed Consent: I have reviewed the patients History and Physical, chart, labs and discussed the procedure including the risks, benefits and alternatives for the proposed anesthesia with the patient or authorized representative who has indicated his/her understanding and acceptance.     Dental Advisory Given  Plan Discussed with: CRNA  Anesthesia Plan Comments:         Anesthesia Quick Evaluation

## 2020-10-23 NOTE — Op Note (Signed)
[facility]astroenterology Patient Name: Victoria Frey Procedure Date: 10/23/2020 8:49 AM MRN: [id number] Account #: [id number] Date of Birth: January 09, 1961 Admit Type: Outpatient Age: 60 Room: [facility] OR ROOM 01 Gender: Female Note Status: [place] Instrument Name: [id number] Procedure:             Colonoscopy Indications:           Screening for colorectal malignant neoplasm Providers:             Felesia Stahlecker B. Bonna Gains MD, MD Referring MD:          Denton Lank MD, MD (Referring MD) Medicines:             Monitored Anesthesia Care Complications:         No immediate complications. Procedure:             Pre-Anesthesia Assessment:                        - ASA Grade Assessment: II - A patient with mild                         systemic disease.                        - Prior to the procedure, a History and Physical was                         performed, and patient medications, allergies and                         sensitivities were reviewed. The patient's tolerance                         of previous anesthesia was reviewed.                        - The risks and benefits of the procedure and the                         sedation options and risks were discussed with the                         patient. All questions were answered and informed                         consent was obtained.                        - Patient identification and proposed procedure were                         verified prior to the procedure by the physician, the                         nurse, the anesthesiologist, the anesthetist and the                         technician. The procedure was verified in the  procedure room.                        After obtaining informed consent, the colonoscope was                         passed under direct vision. Throughout the procedure,                         the patient's blood pressure, pulse, and oxygen                          saturations were monitored continuously. The                         Colonoscope was introduced through the anus and                         advanced to the the cecum, identified by appendiceal                         orifice and ileocecal valve. The colonoscopy was                         performed with ease. The patient tolerated the                         procedure well. The quality of the bowel preparation                         was good. Findings:      The perianal and digital rectal examinations were normal.      A 8 mm polyp was found in the hepatic flexure. The polyp was sessile.       The polyp was removed with a cold snare. Resection and retrieval were       complete.      Two flat and sessile polyps were found in the descending colon. The       polyps were 4 to 9 mm in size. These polyps were removed with a cold       snare. Resection and retrieval were complete.      A 8 mm polyp was found in the sigmoid colon. The polyp was sessile. The       polyp was removed with a cold snare. Resection and retrieval were       complete.      The exam was otherwise without abnormality.      The rectum, sigmoid colon, descending colon, transverse colon, ascending       colon and cecum appeared normal. Biopsies for histology were taken with       a cold forceps from the entire colon for evaluation of microscopic       colitis. Please note that one of the biopsy samples taken for microsopic       colitis was near one of the descending colon polyps (the flat descending       colon polyp). If this jar shows polyp tissue from pathology it would       likely represent a small piece of tissue from the flat descending colon       polyp removed and placed  in the other jar.      The retroflexed view of the distal rectum and anal verge was normal and       showed no anal or rectal abnormalities. Impression:            - One 8 mm polyp at the hepatic flexure, removed with                         a  cold snare. Resected and retrieved.                        - Two 4 to 9 mm polyps in the descending colon,                         removed with a cold snare. Resected and retrieved.                        - One 8 mm polyp in the sigmoid colon, removed with a                         cold snare. Resected and retrieved.                        - The examination was otherwise normal.                        - The rectum, sigmoid colon, descending colon,                         transverse colon, ascending colon and cecum are                         normal. Biopsied.                        - The distal rectum and anal verge are normal on                         retroflexion view. Recommendation:        - Await pathology results.                        - Discharge patient to home (with escort).                        - Advance diet as tolerated.                        - Continue present medications.                        - Repeat colonoscopy date to be determined after                         pending pathology results are reviewed.                        - The findings and recommendations were discussed with  the patient.                        - The findings and recommendations were discussed with                         the patient's family.                        - Return to primary care physician as previously                         scheduled. Procedure Code(s):     --- Professional ---                        [phone], Colonoscopy, flexible; with removal of                         tumor(s), polyp(s), or other lesion(s) by snare                         technique                        45380, 6, Colonoscopy, flexible; with biopsy, single                         or multiple Diagnosis Code(s):     --- Professional ---                        K63.5, Polyp of colon                        Z12.11, Encounter for screening for malignant neoplasm                         of  colon CPT copyright 2019 American Medical Association. All rights reserved. The codes documented in this report are preliminary and upon coder review may  be revised to meet current compliance requirements.  Vonda Antigua, MD Margretta Sidle B. Bonna Gains MD, MD 10/23/2020 9:49:52 AM This report has been signed electronically. Number of Addenda: 0 Note Initiated On: 10/23/2020 8:49 AM Scope Withdrawal Time: 0 hours 22 minutes 55 seconds  Total Procedure Duration: 0 hours 27 minutes 10 seconds  Estimated Blood Loss:  Estimated blood loss: none.      [facility]

## 2020-10-23 NOTE — Transfer of Care (Signed)
Immediate Anesthesia Transfer of Care Note  Patient: Victoria Frey  Procedure(s) Performed: COLONOSCOPY WITH PROPOFOL (Rectum) POLYPECTOMY (Rectum) ESOPHAGOGASTRODUODENOSCOPY (EGD) WITH PROPOFOL ESOPHAGEAL DILATION (Esophagus)  Patient Location: PACU  Anesthesia Type: General  Level of Consciousness: awake, alert  and patient cooperative  Airway and Oxygen Therapy: Patient Spontanous Breathing and Patient connected to supplemental oxygen  Post-op Assessment: Post-op Vital signs reviewed, Patient's Cardiovascular Status Stable, Respiratory Function Stable, Patent Airway and No signs of Nausea or vomiting  Post-op Vital Signs: Reviewed and stable  Complications: No notable events documented.

## 2020-10-24 ENCOUNTER — Encounter: Payer: Self-pay | Admitting: Gastroenterology

## 2020-10-24 LAB — SURGICAL PATHOLOGY

## 2020-10-25 NOTE — Discharge Instructions (Signed)

## 2020-10-29 ENCOUNTER — Ambulatory Visit: Payer: Medicare Other | Admitting: Anesthesiology

## 2020-10-29 ENCOUNTER — Encounter: Payer: Self-pay | Admitting: Ophthalmology

## 2020-10-29 ENCOUNTER — Encounter
Admission: RE | Disposition: A | Discharge status: Home / Self Care | Payer: Self-pay | Source: Home / Self Care | Attending: Ophthalmology

## 2020-10-29 ENCOUNTER — Ambulatory Visit
Admission: RE | Admit: 2020-10-29 | Discharge: 2020-10-29 | Disposition: A | Discharge status: Home / Self Care | Payer: Medicare Other | Attending: Ophthalmology | Admitting: Ophthalmology

## 2020-10-29 ENCOUNTER — Other Ambulatory Visit: Payer: Self-pay

## 2020-10-29 DIAGNOSIS — E1136 Type 2 diabetes mellitus with diabetic cataract: Secondary | ICD-10-CM | POA: Diagnosis not present

## 2020-10-29 DIAGNOSIS — Z803 Family history of malignant neoplasm of breast: Secondary | ICD-10-CM | POA: Insufficient documentation

## 2020-10-29 DIAGNOSIS — Z791 Long term (current) use of non-steroidal anti-inflammatories (NSAID): Secondary | ICD-10-CM | POA: Diagnosis not present

## 2020-10-29 DIAGNOSIS — H2511 Age-related nuclear cataract, right eye: Secondary | ICD-10-CM | POA: Diagnosis not present

## 2020-10-29 DIAGNOSIS — Z7984 Long term (current) use of oral hypoglycemic drugs: Secondary | ICD-10-CM | POA: Insufficient documentation

## 2020-10-29 DIAGNOSIS — Z7951 Long term (current) use of inhaled steroids: Secondary | ICD-10-CM | POA: Diagnosis not present

## 2020-10-29 HISTORY — PX: CATARACT EXTRACTION W/PHACO: SHX586

## 2020-10-29 LAB — GLUCOSE, CAPILLARY
Glucose-Capillary: 91 mg/dL (ref 70–99)
Glucose-Capillary: 93 mg/dL (ref 70–99)

## 2020-10-29 SURGERY — PHACOEMULSIFICATION, CATARACT, WITH IOL INSERTION
Anesthesia: Monitor Anesthesia Care | Site: Eye | Laterality: Right

## 2020-10-29 MED ORDER — SIGHTPATH DOSE#1 SODIUM HYALURONATE 10 MG/ML IO SOLUTION
PREFILLED_SYRINGE | INTRAOCULAR | Status: DC | PRN
  Administered 2020-10-29: 0.85 mL via INTRAOCULAR

## 2020-10-29 MED ORDER — ARMC OPHTHALMIC DILATING DROPS
1.0000 "application " | OPHTHALMIC | Status: DC | PRN
  Administered 2020-10-29 (×3): 1 via OPHTHALMIC

## 2020-10-29 MED ORDER — MIDAZOLAM HCL 2 MG/2ML IJ SOLN
INTRAMUSCULAR | Status: DC | PRN
  Administered 2020-10-29 (×2): 2 mg via INTRAVENOUS

## 2020-10-29 MED ORDER — TETRACAINE HCL 0.5 % OP SOLN
1.0000 [drp] | OPHTHALMIC | Status: DC | PRN
  Administered 2020-10-29 (×3): 1 [drp] via OPHTHALMIC

## 2020-10-29 MED ORDER — LACTATED RINGERS IV SOLN: INTRAVENOUS | Status: DC

## 2020-10-29 MED ORDER — ACETAMINOPHEN 500 MG PO TABS: 1000.0000 mg | ORAL_TABLET | Freq: Once | ORAL | Status: DC | PRN

## 2020-10-29 MED ORDER — ONDANSETRON HCL 4 MG/2ML IJ SOLN: 4.0000 mg | Freq: Once | INTRAMUSCULAR | Status: DC | PRN

## 2020-10-29 MED ORDER — ACETAMINOPHEN 160 MG/5ML PO SOLN: 975.0000 mg | Freq: Once | ORAL | Status: DC | PRN

## 2020-10-29 MED ORDER — SIGHTPATH DOSE#1 BSS IO SOLN
INTRAOCULAR | Status: DC | PRN
  Administered 2020-10-29: 15 mL

## 2020-10-29 MED ORDER — MOXIFLOXACIN HCL 0.5 % OP SOLN
OPHTHALMIC | Status: DC | PRN
  Administered 2020-10-29: 0.2 mL via OPHTHALMIC

## 2020-10-29 MED ORDER — LIDOCAINE HCL (PF) 2 % IJ SOLN
INTRAOCULAR | Status: DC | PRN
  Administered 2020-10-29: 1 mL via INTRAOCULAR

## 2020-10-29 MED ORDER — SIGHTPATH DOSE#1 BSS IO SOLN
INTRAOCULAR | Status: DC | PRN
  Administered 2020-10-29: 72 mL via OPHTHALMIC

## 2020-10-29 MED ORDER — FENTANYL CITRATE (PF) 100 MCG/2ML IJ SOLN
INTRAMUSCULAR | Status: DC | PRN
  Administered 2020-10-29: 100 ug via INTRAVENOUS

## 2020-10-29 MED ORDER — SIGHTPATH DOSE#1 SODIUM HYALURONATE 23 MG/ML IO SOLUTION
PREFILLED_SYRINGE | INTRAOCULAR | Status: DC | PRN
  Administered 2020-10-29: 0.6 mL via INTRAOCULAR

## 2020-10-29 MED ORDER — DEXMEDETOMIDINE (PRECEDEX) IN NS 20 MCG/5ML (4 MCG/ML) IV SYRINGE
PREFILLED_SYRINGE | INTRAVENOUS | Status: DC | PRN
  Administered 2020-10-29: 10 ug via INTRAVENOUS

## 2020-10-29 SURGICAL SUPPLY — 14 items
CANNULA ANT/CHMB 27GA (MISCELLANEOUS) IMPLANT
DISSECTOR HYDRO NUCLEUS 50X22 (MISCELLANEOUS) ×2 IMPLANT
GLOVE SURG GAMMEX PI TX LF 7.5 (GLOVE) ×2 IMPLANT
GLOVE SURG SYN 8.5  E (GLOVE) ×1
GLOVE SURG SYN 8.5 E (GLOVE) ×1 IMPLANT
GOWN STRL REUS W/ TWL LRG LVL3 (GOWN DISPOSABLE) ×2 IMPLANT
GOWN STRL REUS W/TWL LRG LVL3 (GOWN DISPOSABLE) ×4
LENS IOL TECNIS EYHANCE 18.0 (Intraocular Lens) ×2 IMPLANT
MARKER SKIN DUAL TIP RULER LAB (MISCELLANEOUS) IMPLANT
PACK EYE AFTER SURG (MISCELLANEOUS) ×2 IMPLANT
SYR 3ML LL SCALE MARK (SYRINGE) ×2 IMPLANT
SYR TB 1ML LUER SLIP (SYRINGE) ×2 IMPLANT
WATER STERILE IRR 250ML POUR (IV SOLUTION) ×2 IMPLANT
WIPE NON LINTING 3.25X3.25 (MISCELLANEOUS) ×2 IMPLANT

## 2020-10-29 NOTE — Op Note (Signed)
OPERATIVE NOTE  Victoria Frey [id number] 10/29/2020   PREOPERATIVE DIAGNOSIS:  Nuclear sclerotic cataract right eye.  H25.11   POSTOPERATIVE DIAGNOSIS:    Nuclear sclerotic cataract right eye.     PROCEDURE:  Phacoemusification with posterior chamber intraocular lens placement of the right eye   LENS:   Implant Name Type Inv. Item Serial No. Manufacturer Lot No. LRB No. Used Action  LENS IOL TECNIS EYHANCE 18.0 - [id number] Intraocular Lens LENS IOL TECNIS EYHANCE 18.0 [id number] JOHNSON   Right 1 Implanted       Procedure(s) with comments: CATARACT EXTRACTION PHACO AND INTRAOCULAR LENS PLACEMENT (IOC) RIGHT DIABETIC (Right) - 3.35 0:21.5  DIB00 +18.0   SURGEON:  Benay Pillow, MD, MPH  ANESTHESIOLOGIST: Anesthesiologist: April Manson, MD CRNA: Silvana Newness, CRNA   ANESTHESIA:  Topical with tetracaine drops augmented with 1% preservative-free intracameral lidocaine.  ESTIMATED BLOOD LOSS: less than 1 mL.   COMPLICATIONS:  None.   DESCRIPTION OF PROCEDURE:  The patient was identified in the holding room and transported to the operating room and placed in the supine position under the operating microscope.  The right eye was identified as the operative eye and it was prepped and draped in the usual sterile ophthalmic fashion.   A 1.0 millimeter clear-corneal paracentesis was made at the 10:30 position. 0.5 ml of preservative-free 1% lidocaine with epinephrine was injected into the anterior chamber.  The anterior chamber was filled with Healon 5 viscoelastic.  A 2.4 millimeter keratome was used to make a near-clear corneal incision at the 8:00 position.  A curvilinear capsulorrhexis was made with a cystotome and capsulorrhexis forceps.  Balanced salt solution was used to hydrodissect and hydrodelineate the nucleus.   Phacoemulsification was then used in stop and chop fashion to remove the lens nucleus and epinucleus.  The remaining cortex was then removed using the  irrigation and aspiration handpiece. Healon was then placed into the capsular bag to distend it for lens placement.  A lens was then injected into the capsular bag.  The remaining viscoelastic was aspirated.   Wounds were hydrated with balanced salt solution.  The anterior chamber was inflated to a physiologic pressure with balanced salt solution.   Intracameral vigamox 0.1 mL undiluted was injected into the eye and a drop placed onto the ocular surface.  No wound leaks were noted.  The patient was taken to the recovery room in stable condition without complications of anesthesia or surgery  Benay Pillow 10/29/2020, 9:11 AM

## 2020-10-29 NOTE — Transfer of Care (Signed)
Immediate Anesthesia Transfer of Care Note  Patient: Victoria Frey  Procedure(s) Performed: CATARACT EXTRACTION PHACO AND INTRAOCULAR LENS PLACEMENT (IOC) RIGHT DIABETIC (Right: Eye)  Patient Location: PACU  Anesthesia Type: MAC  Level of Consciousness: awake, alert  and patient cooperative  Airway and Oxygen Therapy: Patient Spontanous Breathing and Patient connected to supplemental oxygen  Post-op Assessment: Post-op Vital signs reviewed, Patient's Cardiovascular Status Stable, Respiratory Function Stable, Patent Airway and No signs of Nausea or vomiting  Post-op Vital Signs: Reviewed and stable  Complications: No notable events documented.

## 2020-10-29 NOTE — Anesthesia Postprocedure Evaluation (Signed)
Anesthesia Post Note  Patient: Victoria Frey  Procedure(s) Performed: CATARACT EXTRACTION PHACO AND INTRAOCULAR LENS PLACEMENT (IOC) RIGHT DIABETIC (Right: Eye)     Patient location during evaluation: PACU Anesthesia Type: MAC Level of consciousness: awake and alert Pain management: pain level controlled Vital Signs Assessment: post-procedure vital signs reviewed and stable Respiratory status: spontaneous breathing, nonlabored ventilation and respiratory function stable Cardiovascular status: stable and blood pressure returned to baseline Postop Assessment: no apparent nausea or vomiting Anesthetic complications: no   No notable events documented.  April Manson

## 2020-10-29 NOTE — H&P (Signed)
[facility]   Primary Care Physician:  Denton Lank, MD Ophthalmologist: Dr. Benay Pillow  Pre-Procedure History & Physical: HPI:  Victoria Frey is a 60 y.o. female here for cataract surgery.   Past Medical History:  Diagnosis Date   Bipolar 1 disorder ([place])    Chronic pain    COPD (chronic obstructive pulmonary disease) ([facility])    Depression    Diabetes mellitus without complication ([place])    Drug abuse ([place])    Hypertension     Past Surgical History:  Procedure Laterality Date   BACK SURGERY     CARPAL TUNNEL RELEASE     COLONOSCOPY WITH PROPOFOL N/A 10/23/2020   Procedure: COLONOSCOPY WITH PROPOFOL;  Surgeon: Virgel Manifold, MD;  Location: [place];  Service: Endoscopy;  Laterality: N/A;   ESOPHAGEAL DILATION N/A 10/23/2020   Procedure: ESOPHAGEAL DILATION;  Surgeon: Virgel Manifold, MD;  Location: [place];  Service: Endoscopy;  Laterality: N/A;   ESOPHAGOGASTRODUODENOSCOPY (EGD) WITH PROPOFOL N/A 10/23/2020   Procedure: ESOPHAGOGASTRODUODENOSCOPY (EGD) WITH PROPOFOL;  Surgeon: Virgel Manifold, MD;  Location: [place];  Service: Endoscopy;  Laterality: N/A;  Prediabetic   POLYPECTOMY N/A 10/23/2020   Procedure: POLYPECTOMY;  Surgeon: Virgel Manifold, MD;  Location: [place];  Service: Endoscopy;  Laterality: N/A;   SKIN CANCER EXCISION     TUMOR REMOVAL      Prior to Admission medications   Medication Sig Start Date End Date Taking? Authorizing Provider  albuterol (VENTOLIN HFA) 108 (90 Base) MCG/ACT inhaler SMARTSIG:2 Inhalation By Mouth Every 4-6 Hours PRN 07/29/20  Yes [provider]  amLODipine (NORVASC) 10 MG tablet Take 10 mg by mouth daily.   Yes [provider]  budesonide-formoterol (SYMBICORT) 160-4.5 MCG/ACT inhaler Inhale into the lungs.   Yes [provider]  busPIRone (BUSPAR) 5 MG tablet Take by mouth.   Yes [provider]  FLUoxetine (PROZAC) 40 MG  capsule Take 40 mg by mouth daily. 07/30/20  Yes [provider]  gabapentin (NEURONTIN) 600 MG tablet Take 600 mg by mouth 3 (three) times daily.   Yes [provider]  hydrOXYzine (ATARAX/VISTARIL) 25 MG tablet Take 25 mg by mouth 3 (three) times daily. 07/30/20  Yes [provider]  ibuprofen (ADVIL) 800 MG tablet Take 1 tablet (800 mg total) by mouth every 8 (eight) hours as needed for moderate pain. 10/30/18  Yes Paulette Blanch, MD  lactulose, encephalopathy, (CHRONULAC) 10 GM/15ML SOLN Take 10 g by mouth 2 (two) times daily. 08/02/20  Yes [provider]  linaclotide Rolan Lipa) 290 MCG CAPS capsule Take 1 capsule (290 mcg total) by mouth daily before breakfast. 10/15/20  Yes Vonda Antigua B, MD  losartan (COZAAR) 50 MG tablet Take 50 mg by mouth daily. 07/30/20  Yes [provider]  metFORMIN (GLUCOPHAGE) 500 MG tablet Take 500 mg by mouth 2 (two) times daily. 07/30/20  Yes [provider]  omeprazole (PRILOSEC) 20 MG capsule Take 20 mg by mouth daily. 08/16/20  Yes [provider]  QUEtiapine (SEROQUEL) 400 MG tablet Take 400 mg by mouth at bedtime. 09/21/20  Yes [provider]  rOPINIRole (REQUIP) 0.25 MG tablet Take by mouth. 09/21/20  Yes [provider]  lip balm (CARMEX) ointment Apply 1 application topically as needed for lip care. 06/28/14   Ruffian, III Luanna Cole, PA-C  mupirocin ointment (BACTROBAN) 2 % Apply topically. 08/02/20   [provider]  QUEtiapine (SEROQUEL) 100 MG  tablet Take 200 mg by mouth at bedtime.    [provider]    Allergies as of 09/27/2020   (No Known Allergies)    Family History  Problem Relation Age of Onset   Breast cancer Sister 47    Social History   Socioeconomic History   Marital status: Widowed    Spouse name: Not on file   Number of children: Not on file   Years of education: Not on file   Highest education level: Not on file  Occupational History    Not on file  Tobacco Use   Smoking status: Every Day    Packs/day: 1.00    Years: 45.00    Pack years: 45.00    Types: Cigarettes   Smokeless tobacco: Never   Tobacco comments:    Started as teenager  Vaping Use   Vaping Use: Never used  Substance and Sexual Activity   Alcohol use: Yes    Comment: occ   Drug use: Yes    Types: Cocaine    Comment: reports prescribed hydrocodone   Sexual activity: Yes    Birth control/protection: None  Other Topics Concern   Not on file  Social History Narrative   Not on file   Social Determinants of Health   Financial Resource Strain: Not on file  Food Insecurity: Not on file  Transportation Needs: Not on file  Physical Activity: Not on file  Stress: Not on file  Social Connections: Not on file  Intimate Partner Violence: Not on file    Review of Systems: See HPI, otherwise negative ROS  Physical Exam: BP (!) 141/72   Pulse 92   Temp 97.6 F (36.4 C) (Temporal)   Resp 18   Ht 5\' 7"  (1.702 m)   Wt 86.5 kg   SpO2 94%   BMI 29.85 kg/m  General:   Alert, cooperative in NAD Head:  Normocephalic and atraumatic. Respiratory:  Normal work of breathing. Cardiovascular:  RRR  Impression/Plan: Victoria Frey is here for cataract surgery.  Risks, benefits, limitations, and alternatives regarding cataract surgery have been reviewed with the patient.  Questions have been answered.  All parties agreeable.   Benay Pillow, MD  10/29/2020, 8:37 AM

## 2020-10-29 NOTE — Anesthesia Preprocedure Evaluation (Signed)
Anesthesia Evaluation  Patient identified by MRN, date of birth, ID band Patient awake    Reviewed: Allergy & Precautions, H&P , NPO status , Patient's Chart, lab work & pertinent test results, reviewed documented beta blocker date and time   Airway Mallampati: II  TM Distance: >3 FB Neck ROM: full    Dental no notable dental hx.    Pulmonary neg pulmonary ROS, COPD, Current Smoker and Patient abstained from smoking.,    Pulmonary exam normal breath sounds clear to auscultation       Cardiovascular Exercise Tolerance: Good hypertension, negative cardio ROS   Rhythm:regular Rate:Normal     Neuro/Psych PSYCHIATRIC DISORDERS Bipolar Disorder Psychomotor agitationnegative neurological ROS  negative psych ROS   GI/Hepatic negative GI ROS, Neg liver ROS,   Endo/Other  negative endocrine ROSdiabetes  Renal/GU negative Renal ROS  negative genitourinary   Musculoskeletal   Abdominal   Peds  Hematology negative hematology ROS (+)   Anesthesia Other Findings   Reproductive/Obstetrics negative OB ROS                             Anesthesia Physical Anesthesia Plan  ASA: 2  Anesthesia Plan: MAC   Post-op Pain Management:    Induction:   PONV Risk Score and Plan: 1 and Midazolam, TIVA and Treatment may vary due to age or medical condition  Airway Management Planned:   Additional Equipment:   Intra-op Plan:   Post-operative Plan:   Informed Consent: I have reviewed the patients History and Physical, chart, labs and discussed the procedure including the risks, benefits and alternatives for the proposed anesthesia with the patient or authorized representative who has indicated his/her understanding and acceptance.     Dental Advisory Given  Plan Discussed with: CRNA  Anesthesia Plan Comments:         Anesthesia Quick Evaluation

## 2020-10-30 ENCOUNTER — Encounter: Payer: Self-pay | Admitting: Ophthalmology

## 2020-11-01 ENCOUNTER — Encounter: Payer: Self-pay | Admitting: Gastroenterology

## 2020-11-08 NOTE — Discharge Instructions (Signed)

## 2020-11-12 ENCOUNTER — Encounter: Payer: Self-pay | Admitting: Ophthalmology

## 2020-11-12 ENCOUNTER — Ambulatory Visit
Admission: EM | Admit: 2020-11-12 | Discharge: 2020-11-12 | Disposition: A | Discharge status: Home / Self Care | Payer: Medicare Other | Attending: Ophthalmology | Admitting: Ophthalmology

## 2020-11-12 ENCOUNTER — Other Ambulatory Visit: Payer: Self-pay

## 2020-11-12 ENCOUNTER — Ambulatory Visit: Payer: Medicare Other | Admitting: Anesthesiology

## 2020-11-12 ENCOUNTER — Encounter
Admission: EM | Disposition: A | Discharge status: Home / Self Care | Payer: Self-pay | Source: Home / Self Care | Attending: Ophthalmology

## 2020-11-12 DIAGNOSIS — Z791 Long term (current) use of non-steroidal anti-inflammatories (NSAID): Secondary | ICD-10-CM | POA: Insufficient documentation

## 2020-11-12 DIAGNOSIS — I10 Essential (primary) hypertension: Secondary | ICD-10-CM | POA: Diagnosis not present

## 2020-11-12 DIAGNOSIS — E1136 Type 2 diabetes mellitus with diabetic cataract: Secondary | ICD-10-CM | POA: Diagnosis present

## 2020-11-12 DIAGNOSIS — Z7984 Long term (current) use of oral hypoglycemic drugs: Secondary | ICD-10-CM | POA: Diagnosis not present

## 2020-11-12 DIAGNOSIS — F17210 Nicotine dependence, cigarettes, uncomplicated: Secondary | ICD-10-CM | POA: Insufficient documentation

## 2020-11-12 DIAGNOSIS — Z7951 Long term (current) use of inhaled steroids: Secondary | ICD-10-CM | POA: Diagnosis not present

## 2020-11-12 DIAGNOSIS — J449 Chronic obstructive pulmonary disease, unspecified: Secondary | ICD-10-CM | POA: Diagnosis not present

## 2020-11-12 DIAGNOSIS — H2512 Age-related nuclear cataract, left eye: Secondary | ICD-10-CM | POA: Diagnosis not present

## 2020-11-12 HISTORY — PX: CATARACT EXTRACTION W/PHACO: SHX586

## 2020-11-12 LAB — GLUCOSE, CAPILLARY
Glucose-Capillary: 126 mg/dL — ABNORMAL HIGH (ref 70–99)
Glucose-Capillary: 121 mg/dL — ABNORMAL HIGH (ref 70–99)

## 2020-11-12 SURGERY — PHACOEMULSIFICATION, CATARACT, WITH IOL INSERTION
Anesthesia: Monitor Anesthesia Care | Site: Eye | Laterality: Left

## 2020-11-12 MED ORDER — FENTANYL CITRATE (PF) 100 MCG/2ML IJ SOLN
INTRAMUSCULAR | Status: DC | PRN
  Administered 2020-11-12: 100 ug via INTRAVENOUS

## 2020-11-12 MED ORDER — SIGHTPATH DOSE#1 SODIUM HYALURONATE 23 MG/ML IO SOLUTION
PREFILLED_SYRINGE | INTRAOCULAR | Status: DC | PRN
  Administered 2020-11-12: 0.6 mL via INTRAOCULAR

## 2020-11-12 MED ORDER — SIGHTPATH DOSE#1 BSS IO SOLN
INTRAOCULAR | Status: DC | PRN
  Administered 2020-11-12: 56 mL via OPHTHALMIC

## 2020-11-12 MED ORDER — LACTATED RINGERS IV SOLN: INTRAVENOUS | Status: DC

## 2020-11-12 MED ORDER — DEXMEDETOMIDINE HCL IN NACL 200 MCG/50ML IV SOLN
INTRAVENOUS | Status: DC | PRN
  Administered 2020-11-12 (×2): 10 ug via INTRAVENOUS

## 2020-11-12 MED ORDER — MOXIFLOXACIN HCL 0.5 % OP SOLN
OPHTHALMIC | Status: DC | PRN
  Administered 2020-11-12: 0.2 mL via OPHTHALMIC

## 2020-11-12 MED ORDER — SIGHTPATH DOSE#1 SODIUM HYALURONATE 10 MG/ML IO SOLUTION
PREFILLED_SYRINGE | INTRAOCULAR | Status: DC | PRN
  Administered 2020-11-12: 0.85 mL via INTRAOCULAR

## 2020-11-12 MED ORDER — SIGHTPATH DOSE#1 BSS IO SOLN
INTRAOCULAR | Status: DC | PRN
  Administered 2020-11-12: 15 mL

## 2020-11-12 MED ORDER — MIDAZOLAM HCL 2 MG/2ML IJ SOLN
INTRAMUSCULAR | Status: DC | PRN
  Administered 2020-11-12: 2 mg via INTRAVENOUS

## 2020-11-12 MED ORDER — TETRACAINE HCL 0.5 % OP SOLN
1.0000 [drp] | OPHTHALMIC | Status: DC | PRN
  Administered 2020-11-12 (×3): 1 [drp] via OPHTHALMIC

## 2020-11-12 MED ORDER — ACETAMINOPHEN 160 MG/5ML PO SOLN: 325.0000 mg | Freq: Once | ORAL | Status: DC

## 2020-11-12 MED ORDER — ACETAMINOPHEN 325 MG PO TABS: 325.0000 mg | ORAL_TABLET | Freq: Once | ORAL | Status: DC

## 2020-11-12 MED ORDER — ARMC OPHTHALMIC DILATING DROPS
1.0000 "application " | OPHTHALMIC | Status: DC | PRN
  Administered 2020-11-12 (×3): 1 via OPHTHALMIC

## 2020-11-12 MED ORDER — LIDOCAINE HCL (PF) 2 % IJ SOLN
INTRAOCULAR | Status: DC | PRN
  Administered 2020-11-12: 1 mL via INTRAOCULAR

## 2020-11-12 SURGICAL SUPPLY — 14 items
CANNULA ANT/CHMB 27GA (MISCELLANEOUS) ×2 IMPLANT
DISSECTOR HYDRO NUCLEUS 50X22 (MISCELLANEOUS) ×2 IMPLANT
GLOVE SURG GAMMEX PI TX LF 7.5 (GLOVE) ×2 IMPLANT
GLOVE SURG SYN 8.5  E (GLOVE) ×1
GLOVE SURG SYN 8.5 E (GLOVE) ×1 IMPLANT
GOWN STRL REUS W/ TWL LRG LVL3 (GOWN DISPOSABLE) ×2 IMPLANT
GOWN STRL REUS W/TWL LRG LVL3 (GOWN DISPOSABLE) ×4
LENS IOL TECNIS EYHANCE 18.5 (Intraocular Lens) ×2 IMPLANT
MARKER SKIN DUAL TIP RULER LAB (MISCELLANEOUS) ×2 IMPLANT
PACK EYE AFTER SURG (MISCELLANEOUS) ×2 IMPLANT
SYR 3ML LL SCALE MARK (SYRINGE) ×2 IMPLANT
SYR TB 1ML LUER SLIP (SYRINGE) ×2 IMPLANT
WATER STERILE IRR 250ML POUR (IV SOLUTION) ×2 IMPLANT
WIPE NON LINTING 3.25X3.25 (MISCELLANEOUS) ×2 IMPLANT

## 2020-11-12 NOTE — Anesthesia Postprocedure Evaluation (Signed)
Anesthesia Post Note  Patient: MALANEY MCBEAN  Procedure(s) Performed: CATARACT EXTRACTION PHACO AND INTRAOCULAR LENS PLACEMENT (IOC) LEFT DIABETIC (Left: Eye)     Patient location during evaluation: PACU Anesthesia Type: MAC Level of consciousness: awake and alert and oriented Pain management: satisfactory to patient Vital Signs Assessment: post-procedure vital signs reviewed and stable Respiratory status: spontaneous breathing, nonlabored ventilation and respiratory function stable Cardiovascular status: blood pressure returned to baseline and stable Postop Assessment: Adequate PO intake and No signs of nausea or vomiting Anesthetic complications: no   No notable events documented.  Raliegh Ip

## 2020-11-12 NOTE — Anesthesia Preprocedure Evaluation (Signed)
Anesthesia Evaluation  Patient identified by MRN, date of birth, ID band Patient awake    Reviewed: Allergy & Precautions, H&P , NPO status , Patient's Chart, lab work & pertinent test results, reviewed documented beta blocker date and time   Airway Mallampati: II  TM Distance: >3 FB Neck ROM: full    Dental no notable dental hx.    Pulmonary neg pulmonary ROS, COPD, Current Smoker and Patient abstained from smoking.,    Pulmonary exam normal breath sounds clear to auscultation       Cardiovascular Exercise Tolerance: Good hypertension, negative cardio ROS Normal cardiovascular exam Rhythm:regular Rate:Normal     Neuro/Psych PSYCHIATRIC DISORDERS Bipolar Disorder Psychomotor agitationnegative neurological ROS  negative psych ROS   GI/Hepatic negative GI ROS, Neg liver ROS,   Endo/Other  negative endocrine ROSdiabetes  Renal/GU negative Renal ROS  negative genitourinary   Musculoskeletal   Abdominal   Peds  Hematology negative hematology ROS (+)   Anesthesia Other Findings   Reproductive/Obstetrics negative OB ROS                             Anesthesia Physical  Anesthesia Plan  ASA: 2  Anesthesia Plan: MAC   Post-op Pain Management:    Induction:   PONV Risk Score and Plan: 1 and Treatment may vary due to age or medical condition, TIVA and Midazolam  Airway Management Planned:   Additional Equipment:   Intra-op Plan:   Post-operative Plan:   Informed Consent: I have reviewed the patients History and Physical, chart, labs and discussed the procedure including the risks, benefits and alternatives for the proposed anesthesia with the patient or authorized representative who has indicated his/her understanding and acceptance.     Dental Advisory Given  Plan Discussed with: CRNA  Anesthesia Plan Comments:         Anesthesia Quick Evaluation

## 2020-11-12 NOTE — Transfer of Care (Signed)
Immediate Anesthesia Transfer of Care Note  Patient: Victoria Frey  Procedure(s) Performed: CATARACT EXTRACTION PHACO AND INTRAOCULAR LENS PLACEMENT (IOC) LEFT DIABETIC (Left: Eye)  Patient Location: PACU  Anesthesia Type: MAC  Level of Consciousness: awake, alert  and patient cooperative  Airway and Oxygen Therapy: Patient Spontanous Breathing and Patient connected to supplemental oxygen  Post-op Assessment: Post-op Vital signs reviewed, Patient's Cardiovascular Status Stable, Respiratory Function Stable, Patent Airway and No signs of Nausea or vomiting  Post-op Vital Signs: Reviewed and stable  Complications: No notable events documented.

## 2020-11-12 NOTE — Op Note (Signed)
OPERATIVE NOTE  Victoria Frey [id number] 11/12/2020   PREOPERATIVE DIAGNOSIS:  Nuclear sclerotic cataract left eye.  H25.12   POSTOPERATIVE DIAGNOSIS:    Nuclear sclerotic cataract left eye.     PROCEDURE:  Phacoemusification with posterior chamber intraocular lens placement of the left eye   LENS:   Implant Name Type Inv. Item Serial No. Manufacturer Lot No. LRB No. Used Action  LENS IOL TECNIS EYHANCE 18.5 - [id number] Intraocular Lens LENS IOL TECNIS EYHANCE 18.5 [id number] [id number]   Left 1 Implanted      Procedure(s) with comments: CATARACT EXTRACTION PHACO AND INTRAOCULAR LENS PLACEMENT (IOC) LEFT DIABETIC (Left) - 00:11.2 1.26  DIB00 +18.5   ULTRASOUND TIME:  0 minutes 11 seconds.  CDE 1.26   SURGEON:  Benay Pillow, MD, MPH   ANESTHESIA:  Topical with tetracaine drops augmented with 1% preservative-free intracameral lidocaine.  ESTIMATED BLOOD LOSS: <1 mL   COMPLICATIONS:  None.   DESCRIPTION OF PROCEDURE:  The patient was identified in the holding room and transported to the operating room and placed in the supine position under the operating microscope.  The left eye was identified as the operative eye and it was prepped and draped in the usual sterile ophthalmic fashion.   A 1.0 millimeter clear-corneal paracentesis was made at the 5:00 position. 0.5 ml of preservative-free 1% lidocaine with epinephrine was injected into the anterior chamber.  The anterior chamber was filled with Healon 5 viscoelastic.  A 2.4 millimeter keratome was used to make a near-clear corneal incision at the 2:00 position.  A curvilinear capsulorrhexis was made with a cystotome and capsulorrhexis forceps.  Balanced salt solution was used to hydrodissect and hydrodelineate the nucleus.   Phacoemulsification was then used in stop and chop fashion to remove the lens nucleus and epinucleus.  The remaining cortex was then removed using the irrigation and aspiration handpiece. Healon was then placed  into the capsular bag to distend it for lens placement.  A lens was then injected into the capsular bag.  The remaining viscoelastic was aspirated.   Wounds were hydrated with balanced salt solution.  The anterior chamber was inflated to a physiologic pressure with balanced salt solution.  Intracameral vigamox 0.1 mL undiltued was injected into the eye and a drop placed onto the ocular surface.  No wound leaks were noted.  The patient was taken to the recovery room in stable condition without complications of anesthesia or surgery  Benay Pillow 11/12/2020, 7:53 AM

## 2020-11-12 NOTE — H&P (Signed)
[facility]   Primary Care Physician:  Denton Lank, MD Ophthalmologist: Dr. Benay Pillow  Pre-Procedure History & Physical: HPI:  Victoria Frey is a 60 y.o. female here for cataract surgery.   Past Medical History:  Diagnosis Date   Bipolar 1 disorder ([place])    Chronic pain    COPD (chronic obstructive pulmonary disease) (HCC)    Depression    Diabetes mellitus without complication ([place])    Drug abuse ([place])    Hypertension     Past Surgical History:  Procedure Laterality Date   BACK SURGERY     CARPAL TUNNEL RELEASE     CATARACT EXTRACTION W/PHACO Right 10/29/2020   Procedure: CATARACT EXTRACTION PHACO AND INTRAOCULAR LENS PLACEMENT ([place]) RIGHT DIABETIC;  Surgeon: Eulogio Bear, MD;  Location: [place];  Service: Ophthalmology;  Laterality: Right;  3.35 0:21.5   COLONOSCOPY WITH PROPOFOL N/A 10/23/2020   Procedure: COLONOSCOPY WITH PROPOFOL;  Surgeon: Virgel Manifold, MD;  Location: [place];  Service: Endoscopy;  Laterality: N/A;   ESOPHAGEAL DILATION N/A 10/23/2020   Procedure: ESOPHAGEAL DILATION;  Surgeon: Virgel Manifold, MD;  Location: [place];  Service: Endoscopy;  Laterality: N/A;   ESOPHAGOGASTRODUODENOSCOPY (EGD) WITH PROPOFOL N/A 10/23/2020   Procedure: ESOPHAGOGASTRODUODENOSCOPY (EGD) WITH PROPOFOL;  Surgeon: Virgel Manifold, MD;  Location: [place];  Service: Endoscopy;  Laterality: N/A;  Prediabetic   POLYPECTOMY N/A 10/23/2020   Procedure: POLYPECTOMY;  Surgeon: Virgel Manifold, MD;  Location: [place];  Service: Endoscopy;  Laterality: N/A;   SKIN CANCER EXCISION     TUMOR REMOVAL      Prior to Admission medications   Medication Sig Start Date End Date Taking? Authorizing Provider  albuterol (VENTOLIN HFA) 108 (90 Base) MCG/ACT inhaler SMARTSIG:2 Inhalation By Mouth Every 4-6 Hours PRN 07/29/20  Yes [provider]  amLODipine (NORVASC) 10 MG tablet Take 10 mg by  mouth daily.   Yes [provider]  budesonide-formoterol (SYMBICORT) 160-4.5 MCG/ACT inhaler Inhale into the lungs.   Yes [provider]  busPIRone (BUSPAR) 5 MG tablet Take by mouth.   Yes [provider]  FLUoxetine (PROZAC) 40 MG capsule Take 40 mg by mouth daily. 07/30/20  Yes [provider]  gabapentin (NEURONTIN) 600 MG tablet Take 600 mg by mouth 3 (three) times daily.   Yes [provider]  hydrOXYzine (ATARAX/VISTARIL) 25 MG tablet Take 25 mg by mouth 3 (three) times daily. 07/30/20  Yes [provider]  ibuprofen (ADVIL) 800 MG tablet Take 1 tablet (800 mg total) by mouth every 8 (eight) hours as needed for moderate pain. 10/30/18  Yes Paulette Blanch, MD  lactulose, encephalopathy, (CHRONULAC) 10 GM/15ML SOLN Take 10 g by mouth 2 (two) times daily. 08/02/20  Yes [provider]  linaclotide Rolan Lipa) 290 MCG CAPS capsule Take 1 capsule (290 mcg total) by mouth daily before breakfast. 10/15/20  Yes Virgel Manifold, MD  lip balm (CARMEX) ointment Apply 1 application topically as needed for lip care. 06/28/14  Yes Ruffian, III William C, PA-C  losartan (COZAAR) 50 MG tablet Take 50 mg by mouth daily. 07/30/20  Yes [provider]  metFORMIN (GLUCOPHAGE) 500 MG tablet Take 500 mg by mouth 2 (two) times daily. 07/30/20  Yes [provider]  mupirocin ointment (BACTROBAN) 2 % Apply topically. 08/02/20  Yes [provider]  omeprazole (PRILOSEC) 20 MG capsule Take 20 mg by mouth daily. 08/16/20  Yes [provider]  QUEtiapine (SEROQUEL) 100 MG tablet Take 200 mg by mouth at bedtime.   Yes [provider]  QUEtiapine (SEROQUEL) 400 MG tablet Take 400 mg by mouth at bedtime. 09/21/20  Yes [provider]  rOPINIRole (REQUIP) 0.25 MG tablet Take by mouth. 09/21/20  Yes [provider]    Allergies as of 09/27/2020   (No Known Allergies)    Family History  Problem Relation Age  of Onset   Breast cancer Sister 45    Social History   Socioeconomic History   Marital status: Widowed    Spouse name: Not on file   Number of children: Not on file   Years of education: Not on file   Highest education level: Not on file  Occupational History   Not on file  Tobacco Use   Smoking status: Every Day    Packs/day: 1.00    Years: 45.00    Pack years: 45.00    Types: Cigarettes   Smokeless tobacco: Never   Tobacco comments:    Started as teenager  Vaping Use   Vaping Use: Never used  Substance and Sexual Activity   Alcohol use: Yes    Comment: occ   Drug use: Yes    Types: Cocaine    Comment: reports prescribed hydrocodone   Sexual activity: Yes    Birth control/protection: None  Other Topics Concern   Not on file  Social History Narrative   Not on file   Social Determinants of Health   Financial Resource Strain: Not on file  Food Insecurity: Not on file  Transportation Needs: Not on file  Physical Activity: Not on file  Stress: Not on file  Social Connections: Not on file  Intimate Partner Violence: Not on file    Review of Systems: See HPI, otherwise negative ROS  Physical Exam: BP 119/67   Pulse 69   Temp (!) 97.4 F (36.3 C) (Temporal)   Resp 16   Ht 5\' 7"  (1.702 m)   Wt 90.3 kg   SpO2 96%   BMI 31.17 kg/m  General:   Alert, cooperative in NAD Head:  Normocephalic and atraumatic. Respiratory:  Normal work of breathing. Cardiovascular:  RRR  Impression/Plan: Victoria Frey is here for cataract surgery.  Risks, benefits, limitations, and alternatives regarding cataract surgery have been reviewed with the patient.  Questions have been answered.  All parties agreeable.   Benay Pillow, MD  11/12/2020, 7:16 AM

## 2020-11-15 ENCOUNTER — Ambulatory Visit (INDEPENDENT_AMBULATORY_CARE_PROVIDER_SITE_OTHER): Payer: Self-pay | Admitting: Gastroenterology

## 2020-11-15 DIAGNOSIS — Z23 Encounter for immunization: Secondary | ICD-10-CM

## 2020-11-15 NOTE — Progress Notes (Signed)
Patient was seen today at nurse visit and was given her 1 of 3 Hep A/B Twinrix  Pt tolerated inj well

## 2020-12-04 ENCOUNTER — Encounter: Payer: Self-pay | Admitting: Gastroenterology

## 2020-12-04 ENCOUNTER — Ambulatory Visit (INDEPENDENT_AMBULATORY_CARE_PROVIDER_SITE_OTHER): Payer: Medicare Other | Admitting: Gastroenterology

## 2020-12-04 ENCOUNTER — Other Ambulatory Visit: Payer: Self-pay

## 2020-12-04 VITALS — BP 180/61 | HR 80 | Temp 97.9°F | Wt 195.0 lb

## 2020-12-04 DIAGNOSIS — K5900 Constipation, unspecified: Secondary | ICD-10-CM | POA: Diagnosis not present

## 2020-12-04 MED ORDER — TRULANCE 3 MG PO TABS: 3.0000 mg | ORAL_TABLET | Freq: Every day | ORAL | 1 refills | Status: AC

## 2020-12-04 NOTE — Patient Instructions (Signed)
Start Trulance, take once daily... Samples given

## 2020-12-04 NOTE — Progress Notes (Signed)
Victoria Antigua, MD [address]  [place]  [place], [place] [address]  Main: [phone]  Fax: [phone]   Primary Care Physician: Denton Lank, MD   Chief complaint: Constipation  HPI: Victoria Frey is a 60 y.o. female here for follow-up and is still reporting constipation despite taking Linzess 290 MCG a day.  Is also taking lactulose with this.  No blood in stool.  No nausea or vomiting.  Good appetite.  No weight loss  Recently underwent EGD and colonoscopy.  EGD showed Schatzki's ring which was dilated to 20 mm.  Small hiatal hernia.  Colonoscopy showed 4 subcentimeter polyps that were removed.  Random colon biopsies were negative for microscopic colitis.  Colon polyps showed tubular adenoma and sessile serrated polyps.  Previous history: patient reports at least 70-month history of constipation where she does not have a bowel movement for 3 days straight, followed by 1 or 2 days of loose stools.  PCP notes reviewed, under scanned media, and reports that patient was recently started on lactulose.  Patient is also taking Linzess 145 MCG a day, and lactulose.  He is still not having a bowel movement for 2 to 3 days at a time.  No prior colonoscopy.  Also reports associated abdominal bloating.  Denies abdominal pain.  No nausea or vomiting.  Reports dysphagia to pills and solid foods for the last 6 months.  Does not have teeth but states that she chews her food well and is having dysphagia to her pills as well.    Patient reports that she has had weight loss and states that she is to weigh 224 pounds.  A review of her documented weights show that she was 224 pounds in 2016.  However, she weighed 182 pounds in October 2020 and is 192 pounds today.  ROS: All ROS reviewed and negative except as per HPI   Past Medical History:  Diagnosis Date   Bipolar 1 disorder (HCC)    Chronic pain    COPD (chronic obstructive pulmonary disease) (HCC)    Depression    Diabetes  mellitus without complication ([place])    Drug abuse ([place])    Hypertension     Past Surgical History:  Procedure Laterality Date   BACK SURGERY     CARPAL TUNNEL RELEASE     CATARACT EXTRACTION W/PHACO Right 10/29/2020   Procedure: CATARACT EXTRACTION PHACO AND INTRAOCULAR LENS PLACEMENT ([place]) RIGHT DIABETIC;  Surgeon: Eulogio Bear, MD;  Location: [place];  Service: Ophthalmology;  Laterality: Right;  3.35 0:21.5   CATARACT EXTRACTION W/PHACO Left 11/12/2020   Procedure: CATARACT EXTRACTION PHACO AND INTRAOCULAR LENS PLACEMENT ([place]) LEFT DIABETIC;  Surgeon: Eulogio Bear, MD;  Location: [place];  Service: Ophthalmology;  Laterality: Left;  00:11.2 1.26   COLONOSCOPY WITH PROPOFOL N/A 10/23/2020   Procedure: COLONOSCOPY WITH PROPOFOL;  Surgeon: Virgel Manifold, MD;  Location: [place];  Service: Endoscopy;  Laterality: N/A;   ESOPHAGEAL DILATION N/A 10/23/2020   Procedure: ESOPHAGEAL DILATION;  Surgeon: Virgel Manifold, MD;  Location: [place];  Service: Endoscopy;  Laterality: N/A;   ESOPHAGOGASTRODUODENOSCOPY (EGD) WITH PROPOFOL N/A 10/23/2020   Procedure: ESOPHAGOGASTRODUODENOSCOPY (EGD) WITH PROPOFOL;  Surgeon: Virgel Manifold, MD;  Location: [place];  Service: Endoscopy;  Laterality: N/A;  Prediabetic   POLYPECTOMY N/A 10/23/2020   Procedure: POLYPECTOMY;  Surgeon: Virgel Manifold, MD;  Location: [place];  Service: Endoscopy;  Laterality: N/A;   SKIN  CANCER EXCISION     TUMOR REMOVAL      Prior to Admission medications   Medication Sig Start Date End Date Taking? Authorizing Provider  albuterol (VENTOLIN HFA) 108 (90 Base) MCG/ACT inhaler SMARTSIG:2 Inhalation By Mouth Every 4-6 Hours PRN 07/29/20  Yes [provider]  amLODipine (NORVASC) 10 MG tablet Take 10 mg by mouth daily.   Yes [provider]  budesonide-formoterol (SYMBICORT) 160-4.5 MCG/ACT inhaler Inhale  into the lungs.   Yes [provider]  busPIRone (BUSPAR) 5 MG tablet Take by mouth.   Yes [provider]  FLUoxetine (PROZAC) 40 MG capsule Take 40 mg by mouth daily. 07/30/20  Yes [provider]  gabapentin (NEURONTIN) 600 MG tablet Take 600 mg by mouth 3 (three) times daily.   Yes [provider]  hydrOXYzine (ATARAX/VISTARIL) 25 MG tablet Take 25 mg by mouth 3 (three) times daily. 07/30/20  Yes [provider]  ibuprofen (ADVIL) 800 MG tablet Take 1 tablet (800 mg total) by mouth every 8 (eight) hours as needed for moderate pain. 10/30/18  Yes Paulette Blanch, MD  lactulose, encephalopathy, (CHRONULAC) 10 GM/15ML SOLN Take 10 g by mouth 2 (two) times daily. 08/02/20  Yes [provider]  lip balm (CARMEX) ointment Apply 1 application topically as needed for lip care. 06/28/14  Yes Ruffian, III William C, PA-C  losartan (COZAAR) 50 MG tablet Take 50 mg by mouth daily. 07/30/20  Yes [provider]  metFORMIN (GLUCOPHAGE) 500 MG tablet Take 500 mg by mouth 2 (two) times daily. 07/30/20  Yes [provider]  mupirocin ointment (BACTROBAN) 2 % Apply topically. 08/02/20  Yes [provider]  omeprazole (PRILOSEC) 20 MG capsule Take 20 mg by mouth daily. 08/16/20  Yes [provider]  Plecanatide (TRULANCE) 3 MG TABS Take 3 mg by mouth daily. 12/04/20 03/04/21 Yes Virgel Manifold, MD  QUEtiapine (SEROQUEL) 100 MG tablet Take 200 mg by mouth at bedtime.   Yes [provider]  QUEtiapine (SEROQUEL) 400 MG tablet Take 400 mg by mouth at bedtime. 09/21/20  Yes [provider]  rOPINIRole (REQUIP) 0.25 MG tablet Take by mouth. 09/21/20  Yes [provider]    Family History  Problem Relation Age of Onset   Breast cancer Sister 64     Social History   Tobacco Use   Smoking status: Every Day    Packs/day: 1.00    Years: 45.00    Pack years: 45.00    Types: Cigarettes   Smokeless tobacco:  Never   Tobacco comments:    Started as teenager  Vaping Use   Vaping Use: Never used  Substance Use Topics   Alcohol use: Yes    Comment: occ   Drug use: Yes    Types: Cocaine    Comment: reports prescribed hydrocodone    Allergies as of 12/04/2020   (No Known Allergies)    Physical Examination:  Constitutional: General:   Alert,  Well-developed, well-nourished, pleasant and cooperative in NAD BP (!) 180/61   Pulse 80   Temp 97.9 F (36.6 C) (Oral)   Wt 195 lb (88.5 kg)   BMI 30.54 kg/m   Respiratory: Normal respiratory effort  Gastrointestinal:  Soft, non-tender and non-distended without masses, hepatosplenomegaly or hernias noted.  No guarding or rebound tenderness.     Cardiac: No clubbing or edema.  No cyanosis. Normal posterior tibial pedal pulses noted.  Psych:  Alert and cooperative. Normal mood and affect.  Musculoskeletal:  Normal gait. Head normocephalic, atraumatic. Symmetrical without gross deformities. 5/5 Lower extremity strength bilaterally.  Skin: Warm. Intact without significant lesions or rashes. No jaundice.  Neck: Supple, trachea midline  Lymph: No cervical lymphadenopathy  Psych:  Alert and oriented x3, Alert and cooperative. Normal mood and affect.  Labs: CMP     Component Value Date/Time   NA 138 08/29/2014 1002   NA 141 11/04/2011 1825   K 3.7 08/29/2014 1002   K 4.0 11/04/2011 1825   CL 106 08/29/2014 1002   CL 107 11/04/2011 1825   CO2 23 08/29/2014 1002   CO2 27 11/04/2011 1825   GLUCOSE 177 (H) 08/29/2014 1002   GLUCOSE 104 (H) 11/04/2011 1825   BUN 10 08/29/2014 1002   BUN 11 11/04/2011 1825   CREATININE 1.03 (H) 08/29/2014 1002   CREATININE 0.67 11/04/2011 1825   CALCIUM 8.9 08/29/2014 1002   CALCIUM 8.7 11/04/2011 1825   PROT 7.5 10/15/2020 1008   ALBUMIN 4.8 10/15/2020 1008   AST 24 10/15/2020 1008   ALT 25 10/15/2020 1008   ALKPHOS 95 10/15/2020 1008   BILITOT 0.2 10/15/2020 1008   GFRNONAA >60 08/29/2014 1002    GFRNONAA >60 11/04/2011 1825   GFRAA >60 08/29/2014 1002   GFRAA >60 11/04/2011 1825   Lab Results  Component Value Date   WBC 5.2 08/29/2014   HGB 14.6 08/29/2014   HCT 43.2 08/29/2014   MCV 92.6 08/29/2014   PLT 246 08/29/2014    Imaging Studies:   Assessment and Plan:   Victoria Frey is a 60 y.o. y/o female here for follow-up of dysphagia and constipation  No further dysphagia since dilation of Schatzki's ring  Continues to have constipation despite taking Linzess 290 MCG a day  Discontinue Linzess.  Start Trulance.  Samples of Trulance given.  Patient is taking lactulose and was advised to stop taking this once she starts Trulance.  However, if constipation continues after a week patient advised to call us and we may have to start her on Amitiza instead  Has undergone work-up for fatty liver and hepatitis A and B vaccination recommended with first dose already given and next dose has been scheduled by staff  Dr Victoria Frey

## 2020-12-13 ENCOUNTER — Ambulatory Visit: Payer: Medicare Other | Admitting: Podiatry

## 2020-12-18 ENCOUNTER — Ambulatory Visit (INDEPENDENT_AMBULATORY_CARE_PROVIDER_SITE_OTHER): Payer: Medicare Other | Admitting: Gastroenterology

## 2020-12-18 DIAGNOSIS — Z23 Encounter for immunization: Secondary | ICD-10-CM | POA: Diagnosis not present

## 2020-12-19 NOTE — Progress Notes (Signed)
Patient came to nurse visit today to have 2nd of 3 Twinrix.

## 2020-12-31 ENCOUNTER — Ambulatory Visit: Payer: Medicare Other | Admitting: Gastroenterology

## 2021-01-15 ENCOUNTER — Other Ambulatory Visit: Payer: Self-pay | Admitting: Physician Assistant

## 2021-01-15 DIAGNOSIS — M7542 Impingement syndrome of left shoulder: Secondary | ICD-10-CM

## 2021-01-20 ENCOUNTER — Emergency Department: Payer: Medicare Other

## 2021-01-20 ENCOUNTER — Inpatient Hospital Stay: Payer: Medicare Other

## 2021-01-20 ENCOUNTER — Inpatient Hospital Stay
Admission: EM | Admit: 2021-01-20 | Discharge: 2021-02-13 | DRG: 870 | Disposition: E | Payer: Medicare Other | Attending: Internal Medicine | Admitting: Internal Medicine

## 2021-01-20 ENCOUNTER — Other Ambulatory Visit: Payer: Self-pay

## 2021-01-20 DIAGNOSIS — R680 Hypothermia, not associated with low environmental temperature: Secondary | ICD-10-CM | POA: Diagnosis present

## 2021-01-20 DIAGNOSIS — E1165 Type 2 diabetes mellitus with hyperglycemia: Secondary | ICD-10-CM | POA: Diagnosis present

## 2021-01-20 DIAGNOSIS — Z978 Presence of other specified devices: Secondary | ICD-10-CM | POA: Diagnosis not present

## 2021-01-20 DIAGNOSIS — Z4659 Encounter for fitting and adjustment of other gastrointestinal appliance and device: Secondary | ICD-10-CM

## 2021-01-20 DIAGNOSIS — R4781 Slurred speech: Secondary | ICD-10-CM | POA: Diagnosis present

## 2021-01-20 DIAGNOSIS — G928 Other toxic encephalopathy: Secondary | ICD-10-CM | POA: Diagnosis present

## 2021-01-20 DIAGNOSIS — F319 Bipolar disorder, unspecified: Secondary | ICD-10-CM | POA: Diagnosis present

## 2021-01-20 DIAGNOSIS — J15212 Pneumonia due to Methicillin resistant Staphylococcus aureus: Secondary | ICD-10-CM

## 2021-01-20 DIAGNOSIS — I10 Essential (primary) hypertension: Secondary | ICD-10-CM | POA: Diagnosis present

## 2021-01-20 DIAGNOSIS — Z961 Presence of intraocular lens: Secondary | ICD-10-CM | POA: Diagnosis present

## 2021-01-20 DIAGNOSIS — A419 Sepsis, unspecified organism: Principal | ICD-10-CM

## 2021-01-20 DIAGNOSIS — Z515 Encounter for palliative care: Secondary | ICD-10-CM

## 2021-01-20 DIAGNOSIS — Z9911 Dependence on respirator [ventilator] status: Secondary | ICD-10-CM

## 2021-01-20 DIAGNOSIS — Z7951 Long term (current) use of inhaled steroids: Secondary | ICD-10-CM

## 2021-01-20 DIAGNOSIS — E119 Type 2 diabetes mellitus without complications: Secondary | ICD-10-CM | POA: Diagnosis present

## 2021-01-20 DIAGNOSIS — N179 Acute kidney failure, unspecified: Secondary | ICD-10-CM

## 2021-01-20 DIAGNOSIS — Z66 Do not resuscitate: Secondary | ICD-10-CM | POA: Diagnosis not present

## 2021-01-20 DIAGNOSIS — Z0189 Encounter for other specified special examinations: Secondary | ICD-10-CM

## 2021-01-20 DIAGNOSIS — R0902 Hypoxemia: Secondary | ICD-10-CM

## 2021-01-20 DIAGNOSIS — G8929 Other chronic pain: Secondary | ICD-10-CM | POA: Diagnosis present

## 2021-01-20 DIAGNOSIS — N170 Acute kidney failure with tubular necrosis: Secondary | ICD-10-CM | POA: Diagnosis present

## 2021-01-20 DIAGNOSIS — R6521 Severe sepsis with septic shock: Secondary | ICD-10-CM | POA: Diagnosis present

## 2021-01-20 DIAGNOSIS — R29728 NIHSS score 28: Secondary | ICD-10-CM | POA: Diagnosis not present

## 2021-01-20 DIAGNOSIS — Z452 Encounter for adjustment and management of vascular access device: Secondary | ICD-10-CM

## 2021-01-20 DIAGNOSIS — R579 Shock, unspecified: Secondary | ICD-10-CM | POA: Diagnosis present

## 2021-01-20 DIAGNOSIS — E874 Mixed disorder of acid-base balance: Secondary | ICD-10-CM | POA: Diagnosis present

## 2021-01-20 DIAGNOSIS — J432 Centrilobular emphysema: Secondary | ICD-10-CM | POA: Diagnosis present

## 2021-01-20 DIAGNOSIS — Z8719 Personal history of other diseases of the digestive system: Secondary | ICD-10-CM

## 2021-01-20 DIAGNOSIS — Z79899 Other long term (current) drug therapy: Secondary | ICD-10-CM

## 2021-01-20 DIAGNOSIS — R34 Anuria and oliguria: Secondary | ICD-10-CM | POA: Diagnosis not present

## 2021-01-20 DIAGNOSIS — J441 Chronic obstructive pulmonary disease with (acute) exacerbation: Secondary | ICD-10-CM

## 2021-01-20 DIAGNOSIS — R4189 Other symptoms and signs involving cognitive functions and awareness: Secondary | ICD-10-CM | POA: Diagnosis not present

## 2021-01-20 DIAGNOSIS — N200 Calculus of kidney: Secondary | ICD-10-CM | POA: Diagnosis present

## 2021-01-20 DIAGNOSIS — J80 Acute respiratory distress syndrome: Secondary | ICD-10-CM

## 2021-01-20 DIAGNOSIS — K859 Acute pancreatitis without necrosis or infection, unspecified: Secondary | ICD-10-CM

## 2021-01-20 DIAGNOSIS — J189 Pneumonia, unspecified organism: Secondary | ICD-10-CM

## 2021-01-20 DIAGNOSIS — F17210 Nicotine dependence, cigarettes, uncomplicated: Secondary | ICD-10-CM | POA: Diagnosis present

## 2021-01-20 DIAGNOSIS — J9601 Acute respiratory failure with hypoxia: Secondary | ICD-10-CM | POA: Diagnosis not present

## 2021-01-20 DIAGNOSIS — E871 Hypo-osmolality and hyponatremia: Secondary | ICD-10-CM | POA: Diagnosis present

## 2021-01-20 DIAGNOSIS — I639 Cerebral infarction, unspecified: Secondary | ICD-10-CM

## 2021-01-20 DIAGNOSIS — Z20822 Contact with and (suspected) exposure to covid-19: Secondary | ICD-10-CM | POA: Diagnosis present

## 2021-01-20 DIAGNOSIS — R0602 Shortness of breath: Secondary | ICD-10-CM

## 2021-01-20 DIAGNOSIS — Z7984 Long term (current) use of oral hypoglycemic drugs: Secondary | ICD-10-CM

## 2021-01-20 DIAGNOSIS — E8729 Other acidosis: Secondary | ICD-10-CM

## 2021-01-20 DIAGNOSIS — R7401 Elevation of levels of liver transaminase levels: Secondary | ICD-10-CM

## 2021-01-20 DIAGNOSIS — R140 Abdominal distension (gaseous): Secondary | ICD-10-CM

## 2021-01-20 DIAGNOSIS — Z9842 Cataract extraction status, left eye: Secondary | ICD-10-CM

## 2021-01-20 DIAGNOSIS — Z85828 Personal history of other malignant neoplasm of skin: Secondary | ICD-10-CM

## 2021-01-20 DIAGNOSIS — J9690 Respiratory failure, unspecified, unspecified whether with hypoxia or hypercapnia: Secondary | ICD-10-CM

## 2021-01-20 DIAGNOSIS — A4102 Sepsis due to Methicillin resistant Staphylococcus aureus: Secondary | ICD-10-CM | POA: Diagnosis not present

## 2021-01-20 LAB — URINALYSIS, COMPLETE (UACMP) WITH MICROSCOPIC
Bacteria, UA: NONE SEEN
Bilirubin Urine: NEGATIVE
Glucose, UA: NEGATIVE mg/dL
Hgb urine dipstick: NEGATIVE
Ketones, ur: NEGATIVE mg/dL
Leukocytes,Ua: NEGATIVE
Nitrite: NEGATIVE
Protein, ur: 30 mg/dL — AB
Specific Gravity, Urine: 1.02 (ref 1.005–1.030)
pH: 5 (ref 5.0–8.0)

## 2021-01-20 LAB — URINE DRUG SCREEN, QUALITATIVE (ARMC ONLY)
Amphetamines, Ur Screen: POSITIVE — AB
Barbiturates, Ur Screen: NOT DETECTED
Benzodiazepine, Ur Scrn: NOT DETECTED
Cannabinoid 50 Ng, Ur ~~LOC~~: NOT DETECTED
Cocaine Metabolite,Ur ~~LOC~~: NOT DETECTED
MDMA (Ecstasy)Ur Screen: NOT DETECTED
Methadone Scn, Ur: NOT DETECTED
Opiate, Ur Screen: POSITIVE — AB
Phencyclidine (PCP) Ur S: NOT DETECTED
Tricyclic, Ur Screen: POSITIVE — AB

## 2021-01-20 LAB — LACTATE DEHYDROGENASE: LDH: 415 U/L — ABNORMAL HIGH (ref 98–192)

## 2021-01-20 LAB — CBC WITH DIFFERENTIAL/PLATELET
Abs Immature Granulocytes: 0.23 10*3/uL — ABNORMAL HIGH (ref 0.00–0.07)
Basophils Absolute: 0.1 10*3/uL (ref 0.0–0.1)
Basophils Relative: 1 %
Eosinophils Absolute: 0.4 10*3/uL (ref 0.0–0.5)
Eosinophils Relative: 3 %
HCT: 33.3 % — ABNORMAL LOW (ref 36.0–46.0)
Hemoglobin: 10.6 g/dL — ABNORMAL LOW (ref 12.0–15.0)
Immature Granulocytes: 2 %
Lymphocytes Relative: 3 %
Lymphs Abs: 0.4 10*3/uL — ABNORMAL LOW (ref 0.7–4.0)
MCH: 29.4 pg (ref 26.0–34.0)
MCHC: 31.8 g/dL (ref 30.0–36.0)
MCV: 92.2 fL (ref 80.0–100.0)
Monocytes Absolute: 0.9 10*3/uL (ref 0.1–1.0)
Monocytes Relative: 7 %
Neutro Abs: 11.6 10*3/uL — ABNORMAL HIGH (ref 1.7–7.7)
Neutrophils Relative %: 84 %
Platelets: 251 10*3/uL (ref 150–400)
RBC: 3.61 MIL/uL — ABNORMAL LOW (ref 3.87–5.11)
RDW: 14.7 % (ref 11.5–15.5)
WBC: 13.6 10*3/uL — ABNORMAL HIGH (ref 4.0–10.5)
nRBC: 0 % (ref 0.0–0.2)

## 2021-01-20 LAB — COMPREHENSIVE METABOLIC PANEL
ALT: 41 U/L (ref 0–44)
AST: 84 U/L — ABNORMAL HIGH (ref 15–41)
Albumin: 2.7 g/dL — ABNORMAL LOW (ref 3.5–5.0)
Alkaline Phosphatase: 72 U/L (ref 38–126)
Anion gap: 18 — ABNORMAL HIGH (ref 5–15)
BUN: 75 mg/dL — ABNORMAL HIGH (ref 8–23)
CO2: 11 mmol/L — ABNORMAL LOW (ref 22–32)
Calcium: 6.5 mg/dL — ABNORMAL LOW (ref 8.9–10.3)
Chloride: 104 mmol/L (ref 98–111)
Creatinine, Ser: 5.44 mg/dL — ABNORMAL HIGH (ref 0.44–1.00)
GFR, Estimated: 8 mL/min — ABNORMAL LOW (ref >60)
Glucose, Bld: 159 mg/dL — ABNORMAL HIGH (ref 70–99)
Potassium: 3.9 mmol/L (ref 3.5–5.1)
Sodium: 133 mmol/L — ABNORMAL LOW (ref 135–145)
Total Bilirubin: 0.7 mg/dL (ref 0.3–1.2)
Total Protein: 5.5 g/dL — ABNORMAL LOW (ref 6.5–8.1)

## 2021-01-20 LAB — LACTIC ACID, PLASMA
Lactic Acid, Venous: 4.1 mmol/L (ref 0.5–1.9)
Lactic Acid, Venous: 1.7 mmol/L (ref 0.5–1.9)

## 2021-01-20 LAB — GLUCOSE, CAPILLARY: Glucose-Capillary: 225 mg/dL — ABNORMAL HIGH (ref 70–99)

## 2021-01-20 LAB — AMYLASE: Amylase: 267 U/L — ABNORMAL HIGH (ref 28–100)

## 2021-01-20 LAB — RESP PANEL BY RT-PCR (FLU A&B, COVID) ARPGX2
Influenza A by PCR: NEGATIVE
Influenza B by PCR: NEGATIVE
SARS Coronavirus 2 by RT PCR: NEGATIVE

## 2021-01-20 LAB — PROTIME-INR
INR: 1.6 — ABNORMAL HIGH (ref 0.8–1.2)
Prothrombin Time: 19.1 seconds — ABNORMAL HIGH (ref 11.4–15.2)

## 2021-01-20 LAB — BRAIN NATRIURETIC PEPTIDE: B Natriuretic Peptide: 75.5 pg/mL (ref 0.0–100.0)

## 2021-01-20 LAB — TRIGLYCERIDES: Triglycerides: 98 mg/dL (ref <150)

## 2021-01-20 LAB — SALICYLATE LEVEL: Salicylate Lvl: <7 mg/dL — ABNORMAL LOW (ref 7.0–30.0)

## 2021-01-20 LAB — HEMOGLOBIN A1C
Hgb A1c MFr Bld: 5.9 % — ABNORMAL HIGH (ref 4.8–5.6)
Mean Plasma Glucose: 122.63 mg/dL

## 2021-01-20 LAB — TROPONIN I (HIGH SENSITIVITY)
Troponin I (High Sensitivity): 14 ng/L (ref <18)
Troponin I (High Sensitivity): 14 ng/L (ref <18)

## 2021-01-20 LAB — LIPASE, BLOOD: Lipase: 86 U/L — ABNORMAL HIGH (ref 11–51)

## 2021-01-20 LAB — MRSA NEXT GEN BY PCR, NASAL: MRSA by PCR Next Gen: DETECTED — AB

## 2021-01-20 LAB — ETHANOL: Alcohol, Ethyl (B): <10 mg/dL (ref <10)

## 2021-01-20 LAB — APTT: aPTT: 41 seconds — ABNORMAL HIGH (ref 24–36)

## 2021-01-20 LAB — ACETAMINOPHEN LEVEL: Acetaminophen (Tylenol), Serum: <10 ug/mL — ABNORMAL LOW (ref 10–30)

## 2021-01-20 MED ORDER — POLYETHYLENE GLYCOL 3350 17 G PO PACK: 17.0000 g | PACK | Freq: Every day | ORAL | Status: DC | PRN

## 2021-01-20 MED ORDER — NOREPINEPHRINE 16 MG/250ML-% IV SOLN: 0.0000 ug/min | INTRAVENOUS | Status: DC

## 2021-01-20 MED ORDER — NOREPINEPHRINE 4 MG/250ML-% IV SOLN
INTRAVENOUS | Status: AC
  Administered 2021-01-20: 20 ug/min
  Filled 2021-01-20: qty 250

## 2021-01-20 MED ORDER — SODIUM CHLORIDE 0.9 % IV BOLUS (SEPSIS)
1000.0000 mL | Freq: Once | INTRAVENOUS | Status: AC
  Administered 2021-01-20: 1000 mL via INTRAVENOUS

## 2021-01-20 MED ORDER — METRONIDAZOLE 500 MG/100ML IV SOLN
500.0000 mg | Freq: Once | INTRAVENOUS | Status: AC
  Administered 2021-01-20: 500 mg via INTRAVENOUS
  Filled 2021-01-20: qty 100

## 2021-01-20 MED ORDER — VASOPRESSIN 20 UNITS/100 ML INFUSION FOR SHOCK
0.0000 [IU]/min | INTRAVENOUS | Status: DC
  Administered 2021-01-20 – 2021-01-21 (×4): 0.04 [IU]/min via INTRAVENOUS
  Administered 2021-01-22 – 2021-01-24 (×5): 0.03 [IU]/min via INTRAVENOUS
  Filled 2021-01-20 (×9): qty 100

## 2021-01-20 MED ORDER — VANCOMYCIN HCL IN DEXTROSE 1-5 GM/200ML-% IV SOLN
1000.0000 mg | Freq: Once | INTRAVENOUS | Status: AC
  Administered 2021-01-20: 1000 mg via INTRAVENOUS
  Filled 2021-01-20: qty 200

## 2021-01-20 MED ORDER — FAMOTIDINE IN NACL 20-0.9 MG/50ML-% IV SOLN: 20.0000 mg | Freq: Two times a day (BID) | INTRAVENOUS | Status: DC

## 2021-01-20 MED ORDER — CHLORHEXIDINE GLUCONATE CLOTH 2 % EX PADS
6.0000 | MEDICATED_PAD | Freq: Every day | CUTANEOUS | Status: DC
  Administered 2021-01-21 – 2021-01-27 (×6): 6 via TOPICAL
  Filled 2021-01-20: qty 6

## 2021-01-20 MED ORDER — POLYETHYLENE GLYCOL 3350 17 G PO PACK
17.0000 g | PACK | Freq: Every day | ORAL | Status: DC
  Administered 2021-01-21 – 2021-01-22 (×2): 17 g
  Filled 2021-01-20 (×3): qty 1

## 2021-01-20 MED ORDER — FAMOTIDINE IN NACL 20-0.9 MG/50ML-% IV SOLN
20.0000 mg | INTRAVENOUS | Status: DC
  Administered 2021-01-20 – 2021-01-22 (×2): 20 mg via INTRAVENOUS
  Filled 2021-01-20 (×3): qty 50

## 2021-01-20 MED ORDER — CALCIUM GLUCONATE-NACL 2-0.675 GM/100ML-% IV SOLN
2.0000 g | Freq: Once | INTRAVENOUS | Status: AC
  Administered 2021-01-21: 2000 mg via INTRAVENOUS
  Filled 2021-01-20 (×2): qty 100

## 2021-01-20 MED ORDER — VANCOMYCIN HCL IN DEXTROSE 1-5 GM/200ML-% IV SOLN
1000.0000 mg | Freq: Once | INTRAVENOUS | Status: AC
  Administered 2021-01-21: 1000 mg via INTRAVENOUS
  Filled 2021-01-20: qty 200

## 2021-01-20 MED ORDER — NALOXONE HCL 2 MG/2ML IJ SOSY: 2.0000 mg | PREFILLED_SYRINGE | Freq: Once | INTRAMUSCULAR | Status: DC

## 2021-01-20 MED ORDER — VANCOMYCIN VARIABLE DOSE PER UNSTABLE RENAL FUNCTION (PHARMACIST DOSING): Status: DC

## 2021-01-20 MED ORDER — SODIUM CHLORIDE 0.9 % IV SOLN
2.0000 g | Freq: Once | INTRAVENOUS | Status: AC
  Administered 2021-01-20: 2 g via INTRAVENOUS
  Filled 2021-01-20: qty 2

## 2021-01-20 MED ORDER — HEPARIN SODIUM (PORCINE) 5000 UNIT/ML IJ SOLN
5000.0000 [IU] | Freq: Three times a day (TID) | INTRAMUSCULAR | Status: DC
  Administered 2021-01-20 – 2021-01-21 (×3): 5000 [IU] via SUBCUTANEOUS
  Filled 2021-01-20 (×3): qty 1

## 2021-01-20 MED ORDER — ALBUMIN HUMAN 25 % IV SOLN
25.0000 g | Freq: Four times a day (QID) | INTRAVENOUS | Status: AC
  Administered 2021-01-21 (×2): 25 g via INTRAVENOUS
  Filled 2021-01-20 (×4): qty 100

## 2021-01-20 MED ORDER — MIDAZOLAM-SODIUM CHLORIDE 100-0.9 MG/100ML-% IV SOLN
0.5000 mg/h | INTRAVENOUS | Status: DC
  Administered 2021-01-20: 2 mg/h via INTRAVENOUS
  Administered 2021-01-21 – 2021-01-24 (×5): 8 mg/h via INTRAVENOUS
  Administered 2021-01-24: 5 mg/h via INTRAVENOUS
  Administered 2021-01-25 (×2): 8 mg/h via INTRAVENOUS
  Filled 2021-01-20 (×9): qty 100

## 2021-01-20 MED ORDER — STERILE WATER FOR INJECTION IV SOLN
INTRAVENOUS | Status: DC
  Filled 2021-01-20: qty 150
  Filled 2021-01-20: qty 1000
  Filled 2021-01-20 (×2): qty 150
  Filled 2021-01-20 (×3): qty 1000
  Filled 2021-01-20 (×2): qty 150

## 2021-01-20 MED ORDER — VANCOMYCIN HCL IN DEXTROSE 1-5 GM/200ML-% IV SOLN: 1000.0000 mg | Freq: Once | INTRAVENOUS | Status: DC

## 2021-01-20 MED ORDER — LACTATED RINGERS IV BOLUS
1000.0000 mL | Freq: Once | INTRAVENOUS | Status: AC
  Administered 2021-01-21: 1000 mL via INTRAVENOUS

## 2021-01-20 MED ORDER — DOCUSATE SODIUM 100 MG PO CAPS: 100.0000 mg | ORAL_CAPSULE | Freq: Two times a day (BID) | ORAL | Status: DC | PRN

## 2021-01-20 MED ORDER — NOREPINEPHRINE 16 MG/250ML-% IV SOLN
0.0000 ug/min | INTRAVENOUS | Status: DC
  Administered 2021-01-20: 23:00:00 25 ug/min via INTRAVENOUS
  Administered 2021-01-21 (×2): 40 ug/min via INTRAVENOUS
  Filled 2021-01-20 (×5): qty 250

## 2021-01-20 MED ORDER — SODIUM CHLORIDE 0.9 % IV SOLN
250.0000 mL | INTRAVENOUS | Status: DC
  Administered 2021-01-20 – 2021-01-25 (×3): 250 mL via INTRAVENOUS

## 2021-01-20 MED ORDER — FENTANYL 2500MCG IN NS 250ML (10MCG/ML) PREMIX INFUSION
0.0000 ug/h | INTRAVENOUS | Status: DC
  Administered 2021-01-20: 50 ug/h via INTRAVENOUS
  Administered 2021-01-21: 100 ug/h via INTRAVENOUS
  Administered 2021-01-22 – 2021-01-26 (×6): 150 ug/h via INTRAVENOUS
  Administered 2021-01-27: 100 ug/h via INTRAVENOUS
  Filled 2021-01-20 (×9): qty 250

## 2021-01-20 MED ORDER — DOCUSATE SODIUM 50 MG/5ML PO LIQD
100.0000 mg | Freq: Two times a day (BID) | ORAL | Status: DC
  Administered 2021-01-21 – 2021-01-22 (×4): 100 mg
  Filled 2021-01-20 (×7): qty 10

## 2021-01-20 MED ORDER — PIPERACILLIN-TAZOBACTAM IN DEX 2-0.25 GM/50ML IV SOLN
2.2500 g | Freq: Three times a day (TID) | INTRAVENOUS | Status: DC
  Administered 2021-01-21 (×2): 2.25 g via INTRAVENOUS
  Filled 2021-01-20 (×3): qty 50

## 2021-01-20 MED ORDER — NOREPINEPHRINE 4 MG/250ML-% IV SOLN: 2.0000 ug/min | INTRAVENOUS | Status: DC

## 2021-01-20 MED ORDER — SODIUM BICARBONATE 8.4 % IV SOLN
50.0000 meq | Freq: Once | INTRAVENOUS | Status: AC
Start: 2021-01-20 — End: 2021-01-20
  Administered 2021-01-20: 50 meq via INTRAVENOUS
  Filled 2021-01-20: qty 50

## 2021-01-20 NOTE — Progress Notes (Signed)
Pharmacy Antibiotic Note  Victoria Frey is a 61 y.o. female admitted on 01/17/2021 with sepsis.  Pharmacy has been consulted for vancomycin and Zosyn dosing. Pt Scr on admission 5.44 - unknown baseline (last Scr 1.03 2016).   Plan: Zosyn 2.25g IV q8h Pt to receive vancomycin 2 g IV x 1 loading dose. Will hold off on maintenance dose as pt noted to have AKI. Will assess daily and order dose/level as appropriate  Height: 5\' 7"  (170.2 cm) Weight: 90 kg (198 lb 6.6 oz) IBW/kg (Calculated) : 61.6  Temp (24hrs), Avg:84.1 F (28.9 C), Min:83.2 F (28.4 C), Max:86 F (30 C)  Recent Labs  Lab 01/22/2021 1820  WBC 13.6*  CREATININE 5.44*  LATICACIDVEN 4.1*    Estimated Creatinine Clearance: 12.5 mL/min (A) (by C-G formula based on SCr of 5.44 mg/dL (H)).    No Known Allergies  Antimicrobials this admission: 1/8 cefepime/metronidazole x 1  1/8 vancomycin >>  1/8 Zosyn >>    Microbiology results: 1/8 BCx: sent 1/8 UCx: sent  1/8 MRSA PCR: sent  Thank you for allowing pharmacy to be a part of this patients care.  Forde Dandy Loukas [place] 02/08/2021 9:48 PM

## 2021-01-20 NOTE — Procedures (Signed)
Central Venous Catheter Insertion Procedure Note  Victoria Frey  [id number]  [birth year]  Date:01/22/2021  Time:10:57 PM   Provider Performing:Jeorgia Helming L Rust-Chester   Procedure: Insertion of Non-tunneled Central Venous 217 [id number]) with US guidance (54562)   Indication(s) Medication administration  Consent Risks of the procedure as well as the alternatives and risks of each were explained to the patient and/or caregiver.  Consent for the procedure was obtained and is signed in the bedside chart  Anesthesia Topical only with 1% lidocaine , versed & fentanyl drips in place  Timeout Verified patient identification, verified procedure, site/side was marked, verified correct patient position, special equipment/implants available, medications/allergies/relevant history reviewed, required imaging and test results available.  Sterile Technique Maximal sterile technique including full sterile barrier drape, hand hygiene, sterile gown, sterile gloves, mask, hair covering, sterile ultrasound probe cover (if used).  Procedure Description Area of catheter insertion was cleaned with chlorhexidine and draped in sterile fashion.  With real-time ultrasound guidance a central venous catheter was placed into the left femoral vein. Nonpulsatile blood flow and easy flushing noted in all ports.  The catheter was sutured in place and sterile dressing applied.  Complications/Tolerance None; patient tolerated the procedure well. Chest X-ray is ordered to verify placement for internal jugular or subclavian cannulation.   Chest x-ray is not ordered for femoral cannulation.  EBL Minimal  Specimen(s) None  Initially placed a CVC Right femoral line next to RIGHT femoral Arterial line. KUB revealed CVC to be looped back in R groin with tip directed caudal. RIGHT CVC femoral line removed, and LEFT femoral CVC placed without incident- KUB confirming location.  Domingo Pulse Rust-[place]ster, AGACNP-BC Acute  Care Nurse Practitioner [facility]   [phone] / [phone] Please see Amion for pager details.

## 2021-01-20 NOTE — Procedures (Signed)
Arterial Catheter Insertion Procedure Note  Victoria Frey  [id number]  August 13, 1960  Date:01/19/2021  Time:10:57 PM    Provider Performing: Huel Cote Rust-Chester    Procedure: Insertion of Arterial Line [id number]) with US guidance (33545)   Indication(s) Blood pressure monitoring and/or need for frequent ABGs  Consent Risks of the procedure as well as the alternatives and risks of each were explained to the patient and/or caregiver.  Consent for the procedure was obtained and is signed in the bedside chart  Anesthesia Lidocaine- topical, versed & fentanyl drips running   Time Out Verified patient identification, verified procedure, site/side was marked, verified correct patient position, special equipment/implants available, medications/allergies/relevant history reviewed, required imaging and test results available.   Sterile Technique Maximal sterile technique including full sterile barrier drape, hand hygiene, sterile gown, sterile gloves, mask, hair covering, sterile ultrasound probe cover (if used).   Procedure Description Area of catheter insertion was cleaned with chlorhexidine and draped in sterile fashion. With real-time ultrasound guidance an arterial catheter was placed into the right femoral artery.  Appropriate arterial tracings confirmed on monitor.     Complications/Tolerance None; patient tolerated the procedure well.   EBL Minimal   Specimen(s) None   [name], AGACNP-BC Acute Care Nurse Practitioner [facility]   [phone] / [phone] Please see Amion for pager details.

## 2021-01-20 NOTE — Progress Notes (Signed)
PHARMACY CONSULT NOTE - FOLLOW UP  Pharmacy Consult for Electrolyte Monitoring and Replacement   Recent Labs: Potassium (mmol/L)  Date Value  02/11/2021 3.9  11/04/2011 4.0   Calcium (mg/dL)  Date Value  01/28/2021 6.5 (L)   Calcium, Total (mg/dL)  Date Value  11/04/2011 8.7   Albumin (g/dL)  Date Value  02/06/2021 2.7 (L)  10/15/2020 4.8   Sodium (mmol/L)  Date Value  01/25/2021 133 (L)  11/04/2011 141     Assessment: 61yo female admitted 01/14/2021. Pharmacy consulted for electrolyte replacement.   Goal of Therapy:  Electrolytes WNL  Plan:  Na 133 - likely to improve s/p NS boluses  Corr Ca 7.54 - will give 2 g IV Calcium gluconate x 1 dose Re-check electrolytes with AM labs  Sherilyn Banker ,PharmD Clinical Pharmacist 01/19/2021 8:44 PM

## 2021-01-20 NOTE — Progress Notes (Signed)
eLink Physician-Brief Progress Note Patient Name: Victoria Frey DOB: Jan 24, 1960 MRN: [id number]   Date of Service  02/05/2021  HPI/Events of Note  61 y.o. female with a history of polysubstance abuse who comes ED by EMS due to altered mental status. Intubated in ED.admitted for sepsis  Camera: Getting a central line placement. HR 70, MAP 71, sats 94%. In synchrony with Vent. 450/06/01/58% On Levo,25, midaz, fenta at 75.   Data: Elevated LA Severe AGMA, hyponatremia. Wbc 13 K Hg 10.6 INR 1.6, 6.94/bicarb 9.9  Tox: amphetamine, opiates, tricyclic +. CxR ET in place, bilateral air space densities. CTH and CT chest/abd /pelvis: Pneumonia Cr 5.4 Trop x 2 at 14.  A/P:  Septic shock/AKI/encephalopathy w/o CVA or bleeding/AGMA: source Pneumonia. Polysubstance abuse. Tricyclic +.Elevated LA. Covid/flu neg. On Vent.  - continue vanc/zosyn, can DC metronidazole-pharmacy on board. S/p 30 cc/kg fluids.  Keep MAP > 65 - follow UOP - follow LA - to go on bicarb gtt. Getting a central line - follow abg. - on sedation. -SAT and SBT daily as tolerated. Lung protective ventilation. - VTE: on prophylaxis. SUP on pepcid. - Cbg goals < 180  eICU Interventions  - advance NG tube if not done already, follow KUB. - to go on vasopressin also if MAP soft.     Intervention Category Major Interventions: Sepsis - evaluation and management;Respiratory failure - evaluation and management;Change in mental status - evaluation and management Intermediate Interventions: Best-practice therapies (e.g. DVT, beta blocker, etc.) Evaluation Type: New Patient Evaluation  Elmer Sow 01/26/2021, 10:02 PM

## 2021-01-20 NOTE — ED Provider Notes (Signed)
[facility] Provider Note    Event Date/Time   First MD Initiated Contact with Patient 02/09/2021 2038     (approximate)   History   Bradycardia (EMS called to pt's home for confusion and slurred speech. LKW was Friday. Pt confused and stumbling on EMS arrival. Pt's HR dropped to 30's and pt became unresponsive. Pt paced at 70 with 120 mA on arrival to hospital. Pt intubated with ET tube 24 at the lips PTA. )   HPI  Victoria Frey is a 61 y.o. female with a history of polysubstance abuse who comes ED by EMS due to altered mental status.  EMS report that family checked on the patient today and found her to be confused.  Initially the patient was able to walk with assistance to the ambulance, but during transport she quickly became unconscious and hypotensive.  EMS intubated the patient and started transcutaneous cardiac pacing at 100 mA.  They have given 500 mL IV saline bolus prior to arrival.        Physical Exam   Triage Vital Signs: ED Triage Vitals  Enc Vitals Group     BP 02/08/2021 1816 (!) 75/50     Pulse Rate 01/28/2021 1816 70     Resp 02/12/2021 1816 17     Temp 02/12/2021 1828 (!) 83.2 F (28.4 C)     Temp src --      SpO2 02/01/2021 1815 99 %     Weight 01/24/2021 1813 198 lb 6.6 oz (90 kg)     Height 02/01/2021 1813 5\' 7"  (1.702 m)     Head Circumference --      Peak Flow --      Pain Score --      Pain Loc --      Pain Edu? --      Excl. in [place]? --     Most recent vital signs: Vitals:   01/14/2021 2015 01/31/2021 2030  BP: (!) 91/46 (!) 92/45  Pulse: 70 70  Resp: (!) 23 18  Temp:  (!) 84.6 F (29.2 C)  SpO2: 94% 95%     General: Comatose. CV:  Palpable radial pulse bilaterally.  Cool extremities. Resp:  Endotracheally intubated.  Symmetric breath sounds bilaterally with bagging.  Placed on vent on arrival. Abd:  No distention.  Other:  No serious traumatic findings or inflammatory soft tissue changes.   ED Results / Procedures /  Treatments   Labs (all labs ordered are listed, but only abnormal results are displayed) Labs Reviewed  LACTIC ACID, PLASMA - Abnormal; Notable for the following components:      Result Value   Lactic Acid, Venous 4.1 (*)    All other components within normal limits  COMPREHENSIVE METABOLIC PANEL - Abnormal; Notable for the following components:   Sodium 133 (*)    CO2 11 (*)    Glucose, Bld 159 (*)    BUN 75 (*)    Creatinine, Ser 5.44 (*)    Calcium 6.5 (*)    Total Protein 5.5 (*)    Albumin 2.7 (*)    AST 84 (*)    GFR, Estimated 8 (*)    Anion gap 18 (*)    All other components within normal limits  CBC WITH DIFFERENTIAL/PLATELET - Abnormal; Notable for the following components:   WBC 13.6 (*)    RBC 3.61 (*)    Hemoglobin 10.6 (*)    HCT 33.3 (*)    Neutro  Abs 11.6 (*)    Lymphs Abs 0.4 (*)    Abs Immature Granulocytes 0.23 (*)    All other components within normal limits  PROTIME-INR - Abnormal; Notable for the following components:   Prothrombin Time 19.1 (*)    INR 1.6 (*)    All other components within normal limits  APTT - Abnormal; Notable for the following components:   aPTT 41 (*)    All other components within normal limits  URINALYSIS, COMPLETE (UACMP) WITH MICROSCOPIC - Abnormal; Notable for the following components:   Color, Urine AMBER (*)    APPearance HAZY (*)    Protein, ur 30 (*)    All other components within normal limits  LIPASE, BLOOD - Abnormal; Notable for the following components:   Lipase 86 (*)    All other components within normal limits  BLOOD GAS, ARTERIAL - Abnormal; Notable for the following components:   pH, Arterial 6.94 (*)    pO2, Arterial 116 (*)    Bicarbonate 9.9 (*)    Acid-base deficit 21.8 (*)    All other components within normal limits  ACETAMINOPHEN LEVEL - Abnormal; Notable for the following components:   Acetaminophen (Tylenol), Serum <10 (*)    All other components within normal limits  SALICYLATE LEVEL -  Abnormal; Notable for the following components:   Salicylate Lvl <9.7 (*)    All other components within normal limits  URINE DRUG SCREEN, QUALITATIVE ([facility] ONLY) - Abnormal; Notable for the following components:   Tricyclic, Ur Screen POSITIVE (*)    Amphetamines, Ur Screen POSITIVE (*)    Opiate, Ur Screen POSITIVE (*)    All other components within normal limits  RESP PANEL BY RT-PCR (FLU A&B, COVID) ARPGX2  CULTURE, BLOOD (ROUTINE X 2)  CULTURE, BLOOD (ROUTINE X 2)  URINE CULTURE  BRAIN NATRIURETIC PEPTIDE  ETHANOL  LACTIC ACID, PLASMA  HEMOGLOBIN A1C  HIV ANTIBODY (ROUTINE TESTING W REFLEX)  CBC  MAGNESIUM  PHOSPHORUS  COMPREHENSIVE METABOLIC PANEL  BLOOD GAS, ARTERIAL  TROPONIN I (HIGH SENSITIVITY)  TROPONIN I (HIGH SENSITIVITY)     EKG     RADIOLOGY Chest x-ray is reviewed and interpreted by me shows bilateral infiltrates.  Endotracheal tube in satisfactory position.  Radiology report reviewed.    PROCEDURES:  Critical Care performed: Yes, see critical care procedure note(s)  .Critical Care Performed by: Carrie Mew, MD Authorized by: Carrie Mew, MD   Critical care provider statement:    Critical care time (minutes):  45   Critical care time was exclusive of:  Separately billable procedures and treating other patients   Critical care was necessary to treat or prevent imminent or life-threatening deterioration of the following conditions:  Renal failure, sepsis, shock, respiratory failure, CNS failure or compromise, dehydration, cardiac failure and circulatory failure   Critical care was time spent personally by me on the following activities:  Development of treatment plan with patient or surrogate, discussions with consultants, evaluation of patient's response to treatment, examination of patient, obtaining history from patient or surrogate, ordering and performing treatments and interventions, ordering and review of laboratory studies, ordering  and review of radiographic studies, pulse oximetry, re-evaluation of patient's condition, review of old charts, ventilator management and transcutaneous pacing   Care discussed with: admitting provider   Comments:        External pacer  Date/Time: 01/18/2021 9:11 PM Performed by: Carrie Mew, MD Authorized by: Carrie Mew, MD  Consent: The procedure was performed in an emergent situation.  Patient tolerance: patient tolerated the procedure well with no immediate complications Comments: 093OI, HR 70. Good capture with matching radial pulse.   Marland Kitchen1-3 Lead EKG Interpretation Performed by: Carrie Mew, MD Authorized by: Carrie Mew, MD     Interpretation: normal     ECG rate:  70   ECG rate assessment: normal     Rhythm: paced     Ectopy: none     Conduction: normal     MEDICATIONS ORDERED IN ED: Medications  vancomycin (VANCOCIN) IVPB 1000 mg/200 mL premix (has no administration in time range)    Followed by  vancomycin (VANCOCIN) IVPB 1000 mg/200 mL premix (has no administration in time range)  midazolam (VERSED) 100 mg/100 mL (1 mg/mL) premix infusion (2 mg/hr Intravenous New Bag/Given 01/13/2021 1827)  fentaNYL 2540mcg in NS 244mL (64mcg/ml) infusion-PREMIX (50 mcg/hr Intravenous New Bag/Given 01/18/2021 1827)  lactated ringers bolus 1,000 mL (has no administration in time range)  docusate sodium (COLACE) capsule 100 mg (has no administration in time range)  polyethylene glycol (MIRALAX / GLYCOLAX) packet 17 g (has no administration in time range)  docusate (COLACE) 50 MG/5ML liquid 100 mg (has no administration in time range)  polyethylene glycol (MIRALAX / GLYCOLAX) packet 17 g (has no administration in time range)  heparin injection 5,000 Units (has no administration in time range)  sodium bicarbonate 150 mEq in sterile water 1,150 mL infusion (has no administration in time range)  sodium bicarbonate injection 50 mEq (has no administration in time range)   famotidine (PEPCID) IVPB 20 mg premix (has no administration in time range)  sodium chloride 0.9 % bolus 1,000 mL (0 mLs Intravenous Stopped 01/25/2021 1901)    And  sodium chloride 0.9 % bolus 1,000 mL (0 mLs Intravenous Stopped 01/25/2021 1902)    And  sodium chloride 0.9 % bolus 1,000 mL (1,000 mLs Intravenous New Bag/Given 01/23/2021 1934)  ceFEPIme (MAXIPIME) 2 g in sodium chloride 0.9 % 100 mL IVPB (0 g Intravenous Stopped 02/06/2021 1925)  metroNIDAZOLE (FLAGYL) IVPB 500 mg (0 mg Intravenous Stopped 01/26/2021 1952)  norepinephrine (LEVOPHED) 4-5 MG/250ML-% infusion SOLN (20 mcg/min  New Bag/Given 02/06/2021 1934)     IMPRESSION / MDM / [place] / ED COURSE  I reviewed the triage vital signs and the nursing notes.                              Differential diagnosis includes, but is not limited to, intracranial hemorrhage, pneumonia, drug overdose, electrolyte abnormality, seizures, sepsis, non-STEMI   The patient is on the cardiac monitor to evaluate for evidence of arrhythmia and/or significant heart rate changes.    Clinical Course as of 01/17/2021 2044  Nancy Fetter Jan 20, 2021  1945 Pt with profound hypotension. Levophed started, BP improved to 70/50 with good radial pulse.  Lungs remain clear. Workup pending. [PS]  1949 Additional history obtained from son at bedside. Pt had reported to family she was not feeling well 2 days ago, but no specific symptoms. [PS]  2015 CT head viewed and interpreted by me, appears normal. CT chest shows b/l pna.  [PS]  2043 Radiology reports reviewed for CT head and CT chest abdomen pelvis.  Radiology confirms my findings as well as noting evidence of acute pancreatitis. [PS]    Clinical Course User Index [PS] Carrie Mew, MD   Labs show a pH of 6.9, profound acidemia.  Creatinine of 5.4 consistent with acute renal failure.  Lactate of 4.1 compatible with shock state.  Lipase mildly elevated at 86.  CBC is essentially unremarkable with hemoglobin mildly  anemic at 10, mild leukocytosis of 13,000.  UDS is positive for opiates, amphetamines, tricyclic.  Care discussed with ICU team for admission.   FINAL CLINICAL IMPRESSION(S) / ED DIAGNOSES   Final diagnoses:  Pneumonia of both lungs due to infectious organism, unspecified part of lung  Septic shock ([place])  Acute renal failure, unspecified acute renal failure type ([place])     Rx / DC Orders   ED Discharge Orders     None        Note:  This document was prepared using Dragon voice recognition software and may include unintentional dictation errors.   Carrie Mew, MD 02/12/2021 2116

## 2021-01-20 NOTE — H&P (Addendum)
NAME:  Victoria Frey, MRN:  [id number], DOB:  1960/06/06, LOS: 0 ADMISSION DATE:  02/01/2021, CONSULTATION DATE:  02/04/2021 REFERRING MD:  Dr. Joni Fears, CHIEF COMPLAINT:  bradycardia, unresponsive  History of Present Illness:  61 yo F with LKW Saturday 01/19/21, when patient complained to a family member she didn't feel well and was having trouble going to the bathroom. EMS called by family 02/07/2021 due to confusion & slurred speech. Per ED report on EMS arrival patient appeared confused and stumbling. Initially, patient was able to walk with assistance to the ambulance, but during transport she became unconscious, bradycardic in the 30's and hypotensive. Patient TC paced and intubated in the field, pt received 500 mL bolus. ED course: Workup completed per Sepsis protocol. Medications given: 3 L NS bolus, Levophed drip started, cefepime/flagyl/vancomycin Initial Vitals: hypothermic 83.5, RR- 14, initially hypertensive 237/73 then subsequently hypotensive 46/32, SpO2 99% on 60 % FiO2 with mechanical ventilatory support Significant labs: (Labs/ Imaging personally reviewed) I, Domingo Pulse Rust-[place], AGACNP-BC, personally viewed and interpreted this ECG. Chemistry: mild hyponatremia Na+:133, K+: 3.9, BUN/Cr.: 75/ 5.44, Serum CO2/ AG: 11/18, Alk phos: 72, albumin: 2.7, lipase: 86, AST/ALT: 84/41 Hematology: leukocytosis WBC: 13.6, Hgb: 10.6,  Lactic: 4.1, COVID-19 & Influenza A/B: negative ABG: 6.94/ 46/ 116/ 9.9 CXR 02/01/2021: Diffuse bilateral airspace disease, favor edema/CHF CT chest wo contrast 01/15/2021: Patchy ground-glass densities and consolidation throughout both lungs, concerning for multifocal pneumonia. Alternatively, this could reflect ARDS in the setting of pancreatitis. Trace bilateral pleural effusions in the setting of emphysema.  CT Abdomen and pelvis wo contrast 01/14/2021: Acute pancreatitis.  No peripancreatic fluid collection. Bilateral nonobstructive nephrolithiasis. [place] wo contrast 02/02/2021:  no acute intracranial abnormality  PCCM consulted for admission due to acute respiratory failure and multi-organ failure requiring vasopressors and mechanical ventilatory support.  Pertinent  Medical History  Bipolar disorder 1 & depression HTN COPD not on chronic O2 Current smoker T2DM Polysubstance abuse Significant Hospital Events: Including procedures, antibiotic start and stop dates in addition to other pertinent events   01/29/2021: Admit to ICU with acute respiratory failure requiring mechanical ventilatory support in the setting of acute pancreatitis & circulatory shock on vasopressor support  Interim History / Subjective:  Patient intubated and sedated with vasopressor support, being actively TC paced  Objective   Blood pressure (!) 90/46, pulse 70, temperature (!) 85.3 F (29.6 C), resp. rate 16, height _0  (1.702 m), weight 90 kg, SpO2 94 %.    Vent Mode: AC FiO2 (%):  [60 %] 60 % Set Rate:  [16 bmp-20 bmp] 20 bmp Vt Set:  [450 mL] 450 mL PEEP:  [5 cmH20] 5 cmH20   Intake/Output Summary (Last 24 hours) at 02/08/2021 2122 Last data filed at 01/23/2021 2004 Gross per 24 hour  Intake 3200 ml  Output --  Net 3200 ml   Filed Weights   02/06/2021 1813  Weight: 90 kg    Examination: General: Adult female, critically ill, lying in bed intubated & sedated requiring mechanical ventilation, TC paced, NAD HEENT: MM pink/moist, anicteric, atraumatic, neck supple Neuro: RASS -5, unable to follow commands, PERRL +3 sluggish CV: s1s2 TC pacing, paced rhythm on monitor, no r/m/g Pulm: Regular, non labored on AC 60% FiO2, breath sounds diminished throughout GI: soft, rounded, bs x 4 GU: foley in place with amber urine with sediment Skin: limited exam- no rashes/lesions noted Extremities: warm/dry, pulses + 2 R/P, no edema noted  Resolved Hospital Problem list     Assessment & Plan:  Acute Hypoxic Respiratory Failure multifactorial in the setting of severe metabolic acidosis &  Aspiration pneumonia vs CAP PMHx: COPD - Ventilator settings: PRVC  8 mL/kg, 60% FiO2, 5 PEEP, continue ventilator support & lung protective strategies - Wean PEEP & FiO2 as tolerated, maintain SpO2 > 90% - Head of bed elevated 30 degrees, VAP protocol in place - Plateau pressures less than 30 cm H20  - Intermittent chest x-ray & ABG PRN - Daily WUA with SBT as tolerated  - Ensure adequate pulmonary hygiene  - F/u cultures, trend PCT - zosyn & vancomycin ordered - Budesonide nebs BID, Duo- neb Q 6, bronchodilators PRN - PAD protocol in place: continue Fentanyl drip & Versed drip  Sepsis with septic shock due to suspected Aspiration pneumonia vs CAP vs acute pancreatitis Lactic: 4.1, Baseline PCT: pending, UA: negative, CT: Patchy ground-glass densities and consolidation throughout both lungs, acute pancreatitis, Bilateral nonobstructive nephrolithiasis. Initial interventions/workup included: 3 L of NS & Cefepime/ Vancomycin/ Metronidazole - Supplemental oxygen as needed, to maintain SpO2 > 90% - f/u cultures, trend lactic/ PCT - Daily CBC, monitor WBC/ fever curve - IV antibiotics: zosyn & vancomycin  - Continue vasopressors to maintain MAP< 65, norepinephrine & vasopressin - Strict I/O's: alert provider if UOP < 0.5 mL/kg/hr  Acute Pancreatitis due to unknown etiology Family reports she used to drink heavily but stopped a "while ago", no recent ETOH use. CT revealed no fluid collection/necrosis Modified Marshall's score:11, Ranson's score: 1, BISAP score: 5 - Trend pancreatic enzymes & hepatic function - f/u triglycerides & LDH - Strict I/O's: alert provider if UOP < 0.5 mL/kg/hr - Daily BMP, replace electrolytes PRN - Daily CBC, monitor hemoconcentration & WBC/fever curve, f/u cultures - trend lactic, PCT, CRP - Initiate antibiotic coverage: Zosyn & vancomycin - 3 L IVF bolus received - NPO, place DH tube-will need post-pyloric TF beginning as early as tomorrow if possible.  Consult to dietary  AGMA Acute Kidney Injury  Baseline Cr: 1.03 (2016), Cr on admission: 5.44, serum CO2: 11, AG: 18 ABG: 6.94/ 46/ 116/ 9.9 - sodium bicarb amp followed by sodium bicarbonate drip - Strict I/O's: alert provider if UOP < 0.5 mL/kg/hr - gentle IVF hydration  - Daily BMP, replace electrolytes PRN - Avoid nephrotoxic agents as able, ensure adequate renal perfusion - Consult nephrology, appreciate input   Acute Encephalopathy - f/u ammonia level - supportive care, minimize sedation as tolerated - consider MRI brain in 24 hours  Transaminitis in the setting of circulatory shock - Trend hepatic function, f/u ammonia level - avoid hepatotoxic agents  Type 2 Diabetes Mellitus - Monitor CBG Q 4 hours - SSI moderate dosing - target range while in ICU: 140-180 - follow ICU hyper/hypo-glycemia protocol  Best Practice (right click and "Reselect all SmartList Selections" daily)  Diet/type: NPO w/ meds via tube DVT prophylaxis: prophylactic heparin  GI prophylaxis: H2B Lines: N/A, will be placing arterial & CVC Foley:  Yes, and it is still needed Code Status:  full code Last date of multidisciplinary goals of care discussion [01/22/2021]  Labs   CBC: Recent Labs  Lab 01/21/2021 1820  WBC 13.6*  NEUTROABS 11.6*  HGB 10.6*  HCT 33.3*  MCV 92.2  PLT 800    Basic Metabolic Panel: Recent Labs  Lab 02/06/2021 1820  NA 133*  K 3.9  CL 104  CO2 11*  GLUCOSE 159*  BUN 75*  CREATININE 5.44*  CALCIUM 6.5*   GFR: Estimated Creatinine Clearance: 12.5 mL/min (A) (by C-G formula  based on SCr of 5.44 mg/dL (H)). Recent Labs  Lab 02/09/2021 1820  WBC 13.6*  LATICACIDVEN 4.1*    Liver Function Tests: Recent Labs  Lab 01/17/2021 1820  AST 84*  ALT 41  ALKPHOS 72  BILITOT 0.7  PROT 5.5*  ALBUMIN 2.7*   Recent Labs  Lab 02/11/2021 1820  LIPASE 86*   No results for input(s): AMMONIA in the last 168 hours.  ABG    Component Value Date/Time   PHART 6.94 (LL)  02/06/2021 1818   PCO2ART 46 01/21/2021 1818   PO2ART 116 (H) 01/16/2021 1818   HCO3 9.9 (L) 02/05/2021 1818   ACIDBASEDEF 21.8 (H) 02/04/2021 1818   O2SAT 94.8 02/01/2021 1818     Coagulation Profile: Recent Labs  Lab 02/08/2021 1820  INR 1.6*    Cardiac Enzymes: No results for input(s): CKTOTAL, CKMB, CKMBINDEX, TROPONINI in the last 168 hours.  HbA1C: No results found for: HGBA1C  CBG: No results for input(s): GLUCAP in the last 168 hours.  Review of Systems:   UTA- patient unable to participate in interview, intubated/sedated RASS: -5  Past Medical History:  She,  has a past medical history of Bipolar 1 disorder ([place]), Chronic pain, COPD (chronic obstructive pulmonary disease) ([place]), Depression, Diabetes mellitus without complication ([place]), Drug abuse ([place]), and Hypertension.   Surgical History:   Past Surgical History:  Procedure Laterality Date   BACK SURGERY     CARPAL TUNNEL RELEASE     CATARACT EXTRACTION W/PHACO Right 10/29/2020   Procedure: CATARACT EXTRACTION PHACO AND INTRAOCULAR LENS PLACEMENT ([place]) RIGHT DIABETIC;  Surgeon: Eulogio Bear, MD;  Location: [facility];  Service: Ophthalmology;  Laterality: Right;  3.35 0:21.5   CATARACT EXTRACTION W/PHACO Left 11/12/2020   Procedure: CATARACT EXTRACTION PHACO AND INTRAOCULAR LENS PLACEMENT ([place]) LEFT DIABETIC;  Surgeon: Eulogio Bear, MD;  Location: [facility];  Service: Ophthalmology;  Laterality: Left;  00:11.2 1.26   COLONOSCOPY WITH PROPOFOL N/A 10/23/2020   Procedure: COLONOSCOPY WITH PROPOFOL;  Surgeon: Virgel Manifold, MD;  Location: [facility];  Service: Endoscopy;  Laterality: N/A;   ESOPHAGEAL DILATION N/A 10/23/2020   Procedure: ESOPHAGEAL DILATION;  Surgeon: Virgel Manifold, MD;  Location: [facility];  Service: Endoscopy;  Laterality: N/A;   ESOPHAGOGASTRODUODENOSCOPY (EGD) WITH PROPOFOL N/A 10/23/2020   Procedure: ESOPHAGOGASTRODUODENOSCOPY  (EGD) WITH PROPOFOL;  Surgeon: Virgel Manifold, MD;  Location: [facility];  Service: Endoscopy;  Laterality: N/A;  Prediabetic   POLYPECTOMY N/A 10/23/2020   Procedure: POLYPECTOMY;  Surgeon: Virgel Manifold, MD;  Location: [facility];  Service: Endoscopy;  Laterality: N/A;   SKIN CANCER EXCISION     TUMOR REMOVAL       Social History:   reports that she has been smoking cigarettes. She has a 45.00 pack-year smoking history. She has never used smokeless tobacco. She reports current alcohol use. She reports current drug use. Drug: Cocaine.   Family History:  Her family history includes Breast cancer (age of onset: 35) in her sister.   Allergies No Known Allergies   Home Medications  Prior to Admission medications   Medication Sig Start Date End Date Taking? Authorizing Provider  albuterol (VENTOLIN HFA) 108 (90 Base) MCG/ACT inhaler SMARTSIG:2 Inhalation By Mouth Every 4-6 Hours PRN 07/29/20   [provider]  amLODipine (NORVASC) 10 MG tablet Take 10 mg by mouth daily.    [provider]  budesonide-formoterol (SYMBICORT) 160-4.5 MCG/ACT inhaler Inhale into the lungs.  [provider]  busPIRone (BUSPAR) 5 MG tablet Take by mouth.    [provider]  FLUoxetine (PROZAC) 40 MG capsule Take 40 mg by mouth daily. 07/30/20   [provider]  gabapentin (NEURONTIN) 600 MG tablet Take 600 mg by mouth 3 (three) times daily.    [provider]  hydrOXYzine (ATARAX/VISTARIL) 25 MG tablet Take 25 mg by mouth 3 (three) times daily. 07/30/20   [provider]  ibuprofen (ADVIL) 800 MG tablet Take 1 tablet (800 mg total) by mouth every 8 (eight) hours as needed for moderate pain. 10/30/18   Paulette Blanch, MD  lactulose, encephalopathy, (CHRONULAC) 10 GM/15ML SOLN Take 10 g by mouth 2 (two) times daily. 08/02/20   [provider]  lip balm (CARMEX) ointment Apply 1 application topically as needed for lip  care. 06/28/14   Ruffian, III Luanna Cole, PA-C  losartan (COZAAR) 50 MG tablet Take 50 mg by mouth daily. 07/30/20   [provider]  metFORMIN (GLUCOPHAGE) 500 MG tablet Take 500 mg by mouth 2 (two) times daily. 07/30/20   [provider]  mupirocin ointment (BACTROBAN) 2 % Apply topically. 08/02/20   [provider]  omeprazole (PRILOSEC) 20 MG capsule Take 20 mg by mouth daily. 08/16/20   [provider]  Plecanatide (TRULANCE) 3 MG TABS Take 3 mg by mouth daily. 12/04/20 03/04/21  Virgel Manifold, MD  QUEtiapine (SEROQUEL) 100 MG tablet Take 200 mg by mouth at bedtime.    [provider]  QUEtiapine (SEROQUEL) 400 MG tablet Take 400 mg by mouth at bedtime. 09/21/20   [provider]  rOPINIRole (REQUIP) 0.25 MG tablet Take by mouth. 09/21/20   [provider]     Critical care time: 60 minutes    [name], AGACNP-BC Acute Care Nurse Practitioner [facility]   [phone] / [phone] Please see Amion for pager details.     Domingo Pulse Rust-[place], AGACNP-BC Acute Care Nurse Practitioner [facility]   [phone] / [phone] Please see Amion for pager details.

## 2021-01-20 NOTE — Progress Notes (Signed)
CODE SEPSIS - PHARMACY COMMUNICATION  **Broad Spectrum Antibiotics should be administered within 1 hour of Sepsis diagnosis**  Time Code Sepsis Called/Page Received: 1818  Antibiotics Ordered: vancomycin, metronidazole, and cefepime  Time of 1st antibiotic administration: [place] ,PharmD Clinical Pharmacist  02/01/2021  6:19 PM

## 2021-01-20 NOTE — Sepsis Progress Note (Signed)
[facility] is monitoring this sepsis

## 2021-01-20 NOTE — Progress Notes (Signed)
PHARMACY -  BRIEF ANTIBIOTIC NOTE   Pharmacy has received consult(s) for vancomycin and cefepime from an ED provider.  The patient's profile has been reviewed for ht/wt/allergies/indication/available labs.    One time order(s) placed for: Cefepime 2 g  Vancomycin 2 g  Further antibiotics/pharmacy consults should be ordered by admitting physician if indicated.                       Thank you, Forde Dandy Marya Lowden 01/28/2021  6:20 PM

## 2021-01-20 NOTE — Progress Notes (Addendum)
Notified bedside nurse of need to draw repeat lactic acid. Will continue to follow code sepsis.

## 2021-01-21 ENCOUNTER — Inpatient Hospital Stay: Payer: Medicare Other

## 2021-01-21 DIAGNOSIS — R4189 Other symptoms and signs involving cognitive functions and awareness: Secondary | ICD-10-CM | POA: Diagnosis not present

## 2021-01-21 LAB — COMPREHENSIVE METABOLIC PANEL
ALT: 69 U/L — ABNORMAL HIGH (ref 0–44)
AST: 166 U/L — ABNORMAL HIGH (ref 15–41)
Albumin: 2.9 g/dL — ABNORMAL LOW (ref 3.5–5.0)
Alkaline Phosphatase: 80 U/L (ref 38–126)
Anion gap: 14 (ref 5–15)
BUN: 77 mg/dL — ABNORMAL HIGH (ref 8–23)
CO2: 18 mmol/L — ABNORMAL LOW (ref 22–32)
Calcium: 6.1 mg/dL — CL (ref 8.9–10.3)
Chloride: 100 mmol/L (ref 98–111)
Creatinine, Ser: 4.8 mg/dL — ABNORMAL HIGH (ref 0.44–1.00)
GFR, Estimated: 10 mL/min — ABNORMAL LOW (ref >60)
Glucose, Bld: 188 mg/dL — ABNORMAL HIGH (ref 70–99)
Potassium: 4.3 mmol/L (ref 3.5–5.1)
Sodium: 132 mmol/L — ABNORMAL LOW (ref 135–145)
Total Bilirubin: 1.1 mg/dL (ref 0.3–1.2)
Total Protein: 5.8 g/dL — ABNORMAL LOW (ref 6.5–8.1)

## 2021-01-21 LAB — BLOOD GAS, ARTERIAL
Acid-base deficit: 13.4 mmol/L — ABNORMAL HIGH (ref 0.0–2.0)
Acid-base deficit: 15.8 mmol/L — ABNORMAL HIGH (ref 0.0–2.0)
Acid-base deficit: 19.2 mmol/L — ABNORMAL HIGH (ref 0.0–2.0)
Acid-base deficit: 5.8 mmol/L — ABNORMAL HIGH (ref 0.0–2.0)
Bicarbonate: 12.4 mmol/L — ABNORMAL LOW (ref 20.0–28.0)
Bicarbonate: 14.7 mmol/L — ABNORMAL LOW (ref 20.0–28.0)
Bicarbonate: 17.4 mmol/L — ABNORMAL LOW (ref 20.0–28.0)
Bicarbonate: 22.4 mmol/L (ref 20.0–28.0)
FIO2: 1
FIO2: 1
FIO2: 100
FIO2: 100
MECHVT: 500 mL
MECHVT: 500 mL
MECHVT: 500 mL
MECHVT: 500 mL
Mechanical Rate: 16
Mechanical Rate: 20
O2 Saturation: 75.9 %
O2 Saturation: 82 %
O2 Saturation: 82.6 %
O2 Saturation: 97 %
PEEP: 5 cmH2O
PEEP: 5 cmH2O
PEEP: 8 cmH2O
PEEP: 8 cmH2O
Patient temperature: 37
Patient temperature: 37
Patient temperature: 37
Patient temperature: 37
RATE: 24 resp/min
RATE: 24 resp/min
pCO2 arterial: 52 mmHg — ABNORMAL HIGH (ref 32.0–48.0)
pCO2 arterial: 54 mmHg — ABNORMAL HIGH (ref 32.0–48.0)
pCO2 arterial: 56 mmHg — ABNORMAL HIGH (ref 32.0–48.0)
pCO2 arterial: 63 mmHg — ABNORMAL HIGH (ref 32.0–48.0)
pH, Arterial: 6.97 — CL (ref 7.350–7.450)
pH, Arterial: 7.05 — CL (ref 7.350–7.450)
pH, Arterial: 7.06 — CL (ref 7.350–7.450)
pH, Arterial: 7.21 — ABNORMAL LOW (ref 7.350–7.450)
pO2, Arterial: 108 mmHg (ref 83.0–108.0)
pO2, Arterial: 59 mmHg — ABNORMAL LOW (ref 83.0–108.0)
pO2, Arterial: 68 mmHg — ABNORMAL LOW (ref 83.0–108.0)
pO2, Arterial: 73 mmHg — ABNORMAL LOW (ref 83.0–108.0)

## 2021-01-21 LAB — BASIC METABOLIC PANEL
Anion gap: 11 (ref 5–15)
BUN: 50 mg/dL — ABNORMAL HIGH (ref 8–23)
CO2: 25 mmol/L (ref 22–32)
Calcium: 6.4 mg/dL — CL (ref 8.9–10.3)
Chloride: 97 mmol/L — ABNORMAL LOW (ref 98–111)
Creatinine, Ser: 3.16 mg/dL — ABNORMAL HIGH (ref 0.44–1.00)
GFR, Estimated: 16 mL/min — ABNORMAL LOW (ref >60)
Glucose, Bld: 145 mg/dL — ABNORMAL HIGH (ref 70–99)
Potassium: 3.7 mmol/L (ref 3.5–5.1)
Sodium: 133 mmol/L — ABNORMAL LOW (ref 135–145)

## 2021-01-21 LAB — CORTISOL: Cortisol, Plasma: 40.2 ug/dL

## 2021-01-21 LAB — GLUCOSE, CAPILLARY
Glucose-Capillary: 244 mg/dL — ABNORMAL HIGH (ref 70–99)
Glucose-Capillary: 198 mg/dL — ABNORMAL HIGH (ref 70–99)
Glucose-Capillary: 122 mg/dL — ABNORMAL HIGH (ref 70–99)
Glucose-Capillary: 76 mg/dL (ref 70–99)
Glucose-Capillary: 195 mg/dL — ABNORMAL HIGH (ref 70–99)
Glucose-Capillary: 129 mg/dL — ABNORMAL HIGH (ref 70–99)
Glucose-Capillary: 112 mg/dL — ABNORMAL HIGH (ref 70–99)
Glucose-Capillary: 145 mg/dL — ABNORMAL HIGH (ref 70–99)

## 2021-01-21 LAB — RENAL FUNCTION PANEL
Albumin: 2.9 g/dL — ABNORMAL LOW (ref 3.5–5.0)
Anion gap: 14 (ref 5–15)
BUN: 73 mg/dL — ABNORMAL HIGH (ref 8–23)
CO2: 21 mmol/L — ABNORMAL LOW (ref 22–32)
Calcium: 5.7 mg/dL — CL (ref 8.9–10.3)
Chloride: 96 mmol/L — ABNORMAL LOW (ref 98–111)
Creatinine, Ser: 4.75 mg/dL — ABNORMAL HIGH (ref 0.44–1.00)
GFR, Estimated: 10 mL/min — ABNORMAL LOW (ref >60)
Glucose, Bld: 125 mg/dL — ABNORMAL HIGH (ref 70–99)
Phosphorus: 9.7 mg/dL — ABNORMAL HIGH (ref 2.5–4.6)
Potassium: 4.1 mmol/L (ref 3.5–5.1)
Sodium: 131 mmol/L — ABNORMAL LOW (ref 135–145)

## 2021-01-21 LAB — PROCALCITONIN: Procalcitonin: 4.15 ng/mL

## 2021-01-21 LAB — CBC
HCT: 33.1 % — ABNORMAL LOW (ref 36.0–46.0)
Hemoglobin: 11.2 g/dL — ABNORMAL LOW (ref 12.0–15.0)
MCH: 30.4 pg (ref 26.0–34.0)
MCHC: 33.8 g/dL (ref 30.0–36.0)
MCV: 89.9 fL (ref 80.0–100.0)
Platelets: 265 10*3/uL (ref 150–400)
RBC: 3.68 MIL/uL — ABNORMAL LOW (ref 3.87–5.11)
RDW: 14.7 % (ref 11.5–15.5)
WBC: 3.8 10*3/uL — ABNORMAL LOW (ref 4.0–10.5)
nRBC: 0 % (ref 0.0–0.2)

## 2021-01-21 LAB — AMMONIA: Ammonia: 42 umol/L — ABNORMAL HIGH (ref 9–35)

## 2021-01-21 LAB — HEPARIN LEVEL (UNFRACTIONATED): Heparin Unfractionated: 0.18 IU/mL — ABNORMAL LOW (ref 0.30–0.70)

## 2021-01-21 LAB — LIPASE, BLOOD: Lipase: 54 U/L — ABNORMAL HIGH (ref 11–51)

## 2021-01-21 LAB — MAGNESIUM: Magnesium: 1.9 mg/dL (ref 1.7–2.4)

## 2021-01-21 LAB — PHOSPHORUS: Phosphorus: 10 mg/dL — ABNORMAL HIGH (ref 2.5–4.6)

## 2021-01-21 LAB — HIV ANTIBODY (ROUTINE TESTING W REFLEX): HIV Screen 4th Generation wRfx: NONREACTIVE

## 2021-01-21 MED ORDER — NOREPINEPHRINE BITARTRATE 1 MG/ML IV SOLN
0.0000 ug/min | INTRAVENOUS | Status: DC
  Administered 2021-01-21: 26 ug/min via INTRAVENOUS
  Administered 2021-01-22: 5 ug/min via INTRAVENOUS
  Filled 2021-01-21 (×3): qty 16

## 2021-01-21 MED ORDER — PHENYLEPHRINE CONCENTRATED 100MG/250ML (0.4 MG/ML) INFUSION SIMPLE
0.0000 ug/min | INTRAVENOUS | Status: DC
  Filled 2021-01-21: qty 250

## 2021-01-21 MED ORDER — MIDAZOLAM BOLUS VIA INFUSION
2.0000 mg | INTRAVENOUS | Status: DC | PRN
  Administered 2021-01-25: 4 mg via INTRAVENOUS
  Filled 2021-01-21: qty 4

## 2021-01-21 MED ORDER — PIPERACILLIN-TAZOBACTAM 3.375 G IVPB 30 MIN
3.3750 g | Freq: Four times a day (QID) | INTRAVENOUS | Status: DC
  Administered 2021-01-21 – 2021-01-23 (×7): 3.375 g via INTRAVENOUS
  Filled 2021-01-21 (×11): qty 50

## 2021-01-21 MED ORDER — VANCOMYCIN HCL IN DEXTROSE 1-5 GM/200ML-% IV SOLN
1000.0000 mg | INTRAVENOUS | Status: DC
  Administered 2021-01-21 – 2021-01-22 (×2): 1000 mg via INTRAVENOUS
  Filled 2021-01-21 (×3): qty 200

## 2021-01-21 MED ORDER — PRISMASOL BGK 4/2.5 32-4-2.5 MEQ/L EC SOLN: Status: DC

## 2021-01-21 MED ORDER — CALCIUM GLUCONATE-NACL 2-0.675 GM/100ML-% IV SOLN
2.0000 g | Freq: Once | INTRAVENOUS | Status: AC
  Administered 2021-01-21: 2000 mg via INTRAVENOUS
  Filled 2021-01-21: qty 100

## 2021-01-21 MED ORDER — PRISMASOL BGK 4/2.5 32-4-2.5 MEQ/L REPLACEMENT SOLN
Status: DC
  Filled 2021-01-21 (×14): qty 5000

## 2021-01-21 MED ORDER — HEPARIN SODIUM (PORCINE) 1000 UNIT/ML IJ SOLN: 1000.0000 [IU] | INTRAMUSCULAR | Status: DC | PRN

## 2021-01-21 MED ORDER — CHLORHEXIDINE GLUCONATE 0.12% ORAL RINSE (MEDLINE KIT)
15.0000 mL | Freq: Two times a day (BID) | OROMUCOSAL | Status: DC
  Administered 2021-01-21 – 2021-01-27 (×13): 15 mL via OROMUCOSAL

## 2021-01-21 MED ORDER — VECURONIUM BROMIDE 10 MG IV SOLR
INTRAVENOUS | Status: AC
  Filled 2021-01-21: qty 10

## 2021-01-21 MED ORDER — HEPARIN SODIUM (PORCINE) 1000 UNIT/ML DIALYSIS
1000.0000 [IU] | INTRAMUSCULAR | Status: DC | PRN
  Administered 2021-01-21 (×2): 2800 [IU] via INTRAVENOUS_CENTRAL
  Filled 2021-01-21 (×4): qty 6

## 2021-01-21 MED ORDER — FENTANYL BOLUS VIA INFUSION
50.0000 ug | INTRAVENOUS | Status: DC | PRN
  Administered 2021-01-25 – 2021-01-26 (×2): 100 ug via INTRAVENOUS
  Administered 2021-01-26: 50 ug via INTRAVENOUS
  Administered 2021-01-27 (×3): 100 ug via INTRAVENOUS
  Filled 2021-01-21: qty 100

## 2021-01-21 MED ORDER — HEPARIN (PORCINE) 25000 UT/250ML-% IV SOLN
1100.0000 [IU]/h | INTRAVENOUS | Status: DC
  Administered 2021-01-21: 950 [IU]/h via INTRAVENOUS
  Administered 2021-01-22 – 2021-01-26 (×5): 1100 [IU]/h via INTRAVENOUS
  Filled 2021-01-21 (×6): qty 250

## 2021-01-21 MED ORDER — SODIUM BICARBONATE 8.4 % IV SOLN
INTRAVENOUS | Status: AC
  Filled 2021-01-21: qty 100

## 2021-01-21 MED ORDER — VECURONIUM BROMIDE 10 MG IV SOLR
10.0000 mg | Freq: Once | INTRAVENOUS | Status: AC
  Administered 2021-01-21: 10 mg via INTRAVENOUS

## 2021-01-21 MED ORDER — SODIUM BICARBONATE 8.4 % IV SOLN
100.0000 meq | Freq: Once | INTRAVENOUS | Status: AC
  Administered 2021-01-21: 100 meq via INTRAVENOUS

## 2021-01-21 MED ORDER — LACTULOSE 10 GM/15ML PO SOLN
10.0000 g | Freq: Two times a day (BID) | ORAL | Status: DC
  Administered 2021-01-21 – 2021-01-22 (×4): 10 g
  Filled 2021-01-21 (×4): qty 30

## 2021-01-21 MED ORDER — BUDESONIDE 0.25 MG/2ML IN SUSP
0.2500 mg | Freq: Two times a day (BID) | RESPIRATORY_TRACT | Status: DC
  Administered 2021-01-21 – 2021-01-23 (×5): 0.25 mg via RESPIRATORY_TRACT
  Filled 2021-01-21 (×5): qty 2

## 2021-01-21 MED ORDER — DEXTROSE 50 % IV SOLN
25.0000 mL | Freq: Once | INTRAVENOUS | Status: AC
  Administered 2021-01-21: 25 mL via INTRAVENOUS
  Filled 2021-01-21: qty 50

## 2021-01-21 MED ORDER — IPRATROPIUM-ALBUTEROL 0.5-2.5 (3) MG/3ML IN SOLN
3.0000 mL | Freq: Four times a day (QID) | RESPIRATORY_TRACT | Status: DC
  Administered 2021-01-21 – 2021-01-24 (×13): 3 mL via RESPIRATORY_TRACT
  Filled 2021-01-21 (×13): qty 3

## 2021-01-21 MED ORDER — MUPIROCIN 2 % EX OINT
1.0000 "application " | TOPICAL_OINTMENT | Freq: Two times a day (BID) | CUTANEOUS | Status: AC
  Administered 2021-01-21 – 2021-01-25 (×10): 1 via NASAL
  Filled 2021-01-21: qty 22

## 2021-01-21 MED ORDER — MAGNESIUM SULFATE IN D5W 1-5 GM/100ML-% IV SOLN
1.0000 g | Freq: Once | INTRAVENOUS | Status: AC
  Administered 2021-01-21: 1 g via INTRAVENOUS
  Filled 2021-01-21: qty 100

## 2021-01-21 MED ORDER — ORAL CARE MOUTH RINSE
15.0000 mL | OROMUCOSAL | Status: DC
  Administered 2021-01-21 – 2021-01-27 (×66): 15 mL via OROMUCOSAL

## 2021-01-21 MED ORDER — INSULIN ASPART 100 UNIT/ML IJ SOLN
0.0000 [IU] | INTRAMUSCULAR | Status: DC
  Administered 2021-01-21: 3 [IU] via SUBCUTANEOUS
  Administered 2021-01-21 – 2021-01-23 (×6): 2 [IU] via SUBCUTANEOUS
  Administered 2021-01-23: 3 [IU] via SUBCUTANEOUS
  Administered 2021-01-24: 4 [IU] via SUBCUTANEOUS
  Administered 2021-01-24 (×4): 3 [IU] via SUBCUTANEOUS
  Administered 2021-01-24: 5 [IU] via SUBCUTANEOUS
  Administered 2021-01-25 (×2): 3 [IU] via SUBCUTANEOUS
  Administered 2021-01-25: 5 [IU] via SUBCUTANEOUS
  Administered 2021-01-25: 3 [IU] via SUBCUTANEOUS
  Administered 2021-01-25: 5 [IU] via SUBCUTANEOUS
  Administered 2021-01-25: 3 [IU] via SUBCUTANEOUS
  Administered 2021-01-26: 5 [IU] via SUBCUTANEOUS
  Administered 2021-01-26 (×3): 3 [IU] via SUBCUTANEOUS
  Administered 2021-01-26: 5 [IU] via SUBCUTANEOUS
  Filled 2021-01-21 (×25): qty 1

## 2021-01-21 MED ORDER — CALCIUM GLUCONATE-NACL 2-0.675 GM/100ML-% IV SOLN
2.0000 g | Freq: Once | INTRAVENOUS | Status: AC
  Administered 2021-01-22: 2000 mg via INTRAVENOUS
  Filled 2021-01-21: qty 100

## 2021-01-21 NOTE — Progress Notes (Signed)
ANTICOAGULATION CONSULT NOTE  Pharmacy Consult for heparin Indication:  CRRT clotting issues  No Known Allergies  Patient Measurements: Height: 5\' 7"  (170.2 cm) Weight: 93 kg (205 lb 0.4 oz) IBW/kg (Calculated) : 61.6 Heparin Dosing Weight: 81 kg  Vital Signs: Temp: 96.4 F (35.8 C) (01/09 2300) Temp Source: Bladder (01/09 1900) BP: 117/54 (01/09 2300) Pulse Rate: 75 (01/09 2300)  Labs: Recent Labs    01/19/2021 1820 02/05/2021 2122 01/21/21 0435 01/21/21 0456 01/21/21 1545 01/21/21 2250  HGB 10.6*  --  11.2*  --   --   --   HCT 33.3*  --  33.1*  --   --   --   PLT 251  --  265  --   --   --   APTT 41*  --   --   --   --   --   LABPROT 19.1*  --   --   --   --   --   INR 1.6*  --   --   --   --   --   HEPARINUNFRC  --   --   --   --   --  0.18*  CREATININE 5.44*  --   --  4.80* 4.75*  --   TROPONINIHS 14 14  --   --   --   --      Estimated Creatinine Clearance: 14.6 mL/min (A) (by C-G formula based on SCr of 4.75 mg/dL (H)).   Medical History: Past Medical History:  Diagnosis Date   Bipolar 1 disorder ([place])    Chronic pain    COPD (chronic obstructive pulmonary disease) (HCC)    Depression    Diabetes mellitus without complication ([place])    Drug abuse ([place])    Hypertension       Assessment: 61 year old female presented with respiratory failure, multifactorial requiring intubation. Creatinine elevated on admission, patient to start on CRRT. Pharmacy consulted for heparin management for CRRT clotting issues.  Goal of Therapy:  Heparin level 0.3-0.5 units/ml or lower if no clotting issues reported Monitor platelets by anticoagulation protocol: Yes  1/9 2250 HL 0.18, subtherapeutic   Plan:  --Target HL on low end of therapeutic or even subtherapeutic as long as no clotting issues reported per nursing --Increase heparin rate to 1100 units/hr --Recheck HL with AM labs --CBC daily while on heparin  Renda Rolls, PharmD, [facility] 01/21/2021 11:31  PM

## 2021-01-21 NOTE — Progress Notes (Signed)
Initial Nutrition Assessment  DOCUMENTATION CODES:   Obesity unspecified  INTERVENTION:   Once appropriate for tube feeds, recommend:  Vital HP @55ml /hr- Initiate at 71ml/hr and increase by 14ml/hr q 8 hours until goal rate is reached.   Pro-Source 44ml BID via tube, provides 40kcal and 11g of protein per serving   Regimen provides 1400kcal/day, 138g/day protein and 1274ml/day of free water  Rena-vit daily via tube  Liquid MVI daily via tube   NUTRITION DIAGNOSIS:   Inadequate oral intake related to inability to eat (pt sedated and ventilated) as evidenced by NPO status.  GOAL:   Provide needs based on ASPEN/SCCM guidelines  MONITOR:   Vent status, Labs, Weight trends, Skin, I & O's  REASON FOR ASSESSMENT:   Consult Enteral/tube feeding initiation and management  ASSESSMENT:   61 y/o female with h/o bipolar disorder, substance abuse, depression, HTN, COPD, DM and Schatzki's ring s/p esophageal dilation who is admitted with AKI, sepsis, possible pancreatitis and aspiration PNA.  Pt sedated and ventilated. OGT in place. Pt's daughter-in-law at bedside and reports pt with good appetite and oral intake at baseline. Per chart, pt with weight gain pta. Plan is to start CRRT today. Will hold feeds today as pt on two pressors with soft MAP; will re-assess tomorrow.   Medications reviewed and include: colace, heparin, insulin, lactulose, miralax, pepcid, levophed, phenylephrine, zosyn, Na bicarbonate, vasopressin   Labs reviewed: Na 132(L), BUN 77(H), creat 4.8(H), Ca 6.1(L) adj. 6.98(L), P 10.0(H), Mg 1.9 wnl, albumin 2.9(L), lipase 54(H), AST 166(H), ALT 69(H), ammonia 42(H) Wbc- 3.8(L) Cbgs- 76, 122, 198 x 24 hrs AIC 5.9(H)- 1/8  Patient is currently intubated on ventilator support MV: 11.6 L/min Temp (24hrs), Avg:93.9 F (34.4 C), Min:83.2 F (28.4 C), Max:101.7 F (38.7 C)  Propofol: none   MAP- 58-59mmHg   UOP- 375ml   NUTRITION - FOCUSED PHYSICAL  EXAM:  Flowsheet Row Most Recent Value  Orbital Region No depletion  Upper Arm Region No depletion  Thoracic and Lumbar Region No depletion  Buccal Region No depletion  Temple Region No depletion  Clavicle Bone Region No depletion  Clavicle and Acromion Bone Region No depletion  Scapular Bone Region No depletion  Dorsal Hand No depletion  Patellar Region No depletion  Anterior Thigh Region No depletion  Posterior Calf Region No depletion  Edema (RD Assessment) None  Hair Reviewed  Eyes Reviewed  Mouth Reviewed  Skin Reviewed  Nails Reviewed   Diet Order:   Diet Order             Diet NPO time specified  Diet effective now                  EDUCATION NEEDS:   No education needs have been identified at this time  Skin:  Skin Assessment: Reviewed RN Assessment  Last BM:  pta  Height:   Ht Readings from Last 1 Encounters:  01/30/2021 5\' 7"  (1.702 m)    Weight:   Wt Readings from Last 1 Encounters:  01/28/2021 93 kg    Ideal Body Weight:  61.36 kg  BMI:  Body mass index is 32.11 kg/m.  Estimated Nutritional Needs:   Kcal:  1023-1302kcal/day  Protein:  >125g/day  Fluid:  1.9-2.2L/day  Koleen Distance MS, RD, LDN Please refer to [facility] for RD and/or RD on-call/weekend/after hours pager

## 2021-01-21 NOTE — Procedures (Signed)
Central Venous Catheter Insertion Procedure Note  TAMALYN WADSWORTH  [id number]  1960-07-14  Date:01/21/21  Time:10:18 AM   Provider Performing:Duy Lemming D Dewaine Conger   Procedure: Insertion of Non-tunneled Central Venous Catheter(36556)with US guidance (81017)    Indication(s) Hemodialysis  Consent Risks of the procedure as well as the alternatives and risks of each were explained to the patient and/or caregiver.  Consent for the procedure was obtained and is signed in the bedside chart  Anesthesia Topical only with 1% lidocaine   Timeout Verified patient identification, verified procedure, site/side was marked, verified correct patient position, special equipment/implants available, medications/allergies/relevant history reviewed, required imaging and test results available.  Sterile Technique Maximal sterile technique including full sterile barrier drape, hand hygiene, sterile gown, sterile gloves, mask, hair covering, sterile ultrasound probe cover (if used).  Procedure Description Area of catheter insertion was cleaned with chlorhexidine and draped in sterile fashion.   With real-time ultrasound guidance a HD catheter was placed into the right internal jugular vein.  Nonpulsatile blood flow and easy flushing noted in all ports.  The catheter was sutured in place and sterile dressing applied.  Complications/Tolerance None; patient tolerated the procedure well. Chest X-ray is ordered to verify placement for internal jugular or subclavian cannulation.  Chest x-ray is not ordered for femoral cannulation.  EBL Minimal  Specimen(s) None  Line secured at the 16 cm mark. BIOPATCH applied to the insertion site.      Darel Hong, AGACNP-BC [facility] Prefer epic messenger for cross cover needs If after hours, please call E-link

## 2021-01-21 NOTE — Progress Notes (Signed)
PHARMACY CONSULT NOTE - FOLLOW UP  Pharmacy Consult for Electrolyte Monitoring and Replacement   Recent Labs: Potassium (mmol/L)  Date Value  01/21/2021 4.3  11/04/2011 4.0   Magnesium (mg/dL)  Date Value  01/21/2021 1.9   Calcium (mg/dL)  Date Value  01/21/2021 6.1 (LL)   Calcium, Total (mg/dL)  Date Value  11/04/2011 8.7   Albumin (g/dL)  Date Value  01/21/2021 2.9 (L)  10/15/2020 4.8   Phosphorus (mg/dL)  Date Value  01/21/2021 10.0 (H)   Sodium (mmol/L)  Date Value  01/21/2021 132 (L)  11/04/2011 141     Assessment: 61yo female admitted 02/12/2021 with respiratory failure, multifactorial. Patient is intubated in the ICU. Currently on CRRT. Pharmacy consulted for electrolyte replacement.   Goal of Therapy:  Electrolytes WNL  Plan:  --No replacement indicated at this time --Follow up electrolytes with morning labs  Tawnya Crook, PharmD, BCPS Clinical Pharmacist 01/21/2021 3:24 PM

## 2021-01-21 NOTE — Progress Notes (Signed)
Patient desatted and BP lowered when head of bed lowered.

## 2021-01-21 NOTE — Progress Notes (Signed)
NAME:  Victoria Frey, MRN:  [id number], DOB:  Jun 26, 1960, LOS: 1 ADMISSION DATE:  01/19/2021  History of Present Illness:  61 yo F with LKW Saturday 01/19/21, when patient complained to a family member she didn't feel well and was having trouble going to the bathroom. EMS called by family 02/10/2021 due to confusion & slurred speech. Per ED report on EMS arrival patient appeared confused and stumbling. Initially, patient was able to walk with assistance to the ambulance, but during transport she became unconscious, bradycardic in the 30's and hypotensive. Patient TC paced and intubated in the field, pt received 500 mL bolus. ED course: Workup completed per Sepsis protocol. Medications given: 3 L NS bolus, Levophed drip started, cefepime/flagyl/vancomycin Initial Vitals: hypothermic 83.5, RR- 14, initially hypertensive 237/73 then subsequently hypotensive 46/32, SpO2 99% on 60 % FiO2 with mechanical ventilatory support Significant labs: (Labs/ Imaging personally reviewed) I, Domingo Pulse [place], AGACNP-BC, personally viewed and interpreted this ECG. Chemistry: mild hyponatremia Na+:133, K+: 3.9, BUN/Cr.: 75/ 5.44, Serum CO2/ AG: 11/18, Alk phos: 72, albumin: 2.7, lipase: 86, AST/ALT: 84/41 Hematology: leukocytosis WBC: 13.6, Hgb: 10.6,  Lactic: 4.1, COVID-19 & Influenza A/B: negative ABG: 6.94/ 46/ 116/ 9.9 CXR 01/25/2021: Diffuse bilateral airspace disease, favor edema/CHF CT chest wo contrast 02/08/2021: Patchy ground-glass densities and consolidation throughout both lungs, concerning for multifocal pneumonia. Alternatively, this could reflect ARDS in the setting of pancreatitis. Trace bilateral pleural effusions in the setting of emphysema.  CT Abdomen and pelvis wo contrast 01/16/2021: Acute pancreatitis.  No peripancreatic fluid collection. Bilateral nonobstructive nephrolithiasis. [place] wo contrast 01/30/2021: no acute intracranial abnormality   PCCM consulted for admission due to acute respiratory failure  and multi-organ failure requiring vasopressors and mechanical ventilatory support.   Pertinent  Medical History  Bipolar disorder 1 & depression HTN COPD not on chronic O2 Current smoker T2DM Polysubstance abuse Significant Hospital Events: Including procedures, antibiotic start and stop dates in addition to other pertinent events   01/18/2021: Admit to ICU with acute respiratory failure requiring mechanical ventilatory support in the setting of acute pancreatitis & circulatory shock on vasopressor support 1/9 severe ARDS   Interim History / Subjective:  +multiorgan failure Severe ARDS Severe renal failure +shock on pressors  Micro Data:  COVID and FLU NED  Antimicrobials:   Antibiotics Given (last 72 hours)     Date/Time Action Medication Dose Rate   02/04/2021 1852 New Bag/Given   metroNIDAZOLE (FLAGYL) IVPB 500 mg 500 mg 100 mL/hr   01/17/2021 1855 New Bag/Given   ceFEPIme (MAXIPIME) 2 g in sodium chloride 0.9 % 100 mL IVPB 2 g 200 mL/hr   01/21/2021 2107 New Bag/Given   vancomycin (VANCOCIN) IVPB 1000 mg/200 mL premix 1,000 mg 200 mL/hr   01/21/21 0118 New Bag/Given   piperacillin-tazobactam (ZOSYN) IVPB 2.25 g 2.25 g 100 mL/hr   01/21/21 0127 New Bag/Given   vancomycin (VANCOCIN) IVPB 1000 mg/200 mL premix 1,000 mg 200 mL/hr   01/21/21 0740 New Bag/Given   piperacillin-tazobactam (ZOSYN) IVPB 2.25 g 2.25 g 100 mL/hr               Objective   Blood pressure (!) 134/33, pulse 81, temperature 98.2 F (36.8 C), resp. rate (!) 25, height _0  (1.702 m), weight 93 kg, SpO2 98 %. CVP:  [22 mmHg] 22 mmHg  Vent Mode: PRVC FiO2 (%):  [60 %-100 %] 100 % Set Rate:  [16 bmp-24 bmp] 24 bmp Vt Set:  [450 mL-500 mL] 500 mL PEEP:  [  [place] Pressure:  [20 cmH20] 20 cmH20   Intake/Output Summary (Last 24 hours) at 01/21/2021 0739 Last data filed at 01/21/2021 0600 Gross per 24 hour  Intake 5114.23 ml  Output 350 ml  Net 4764.23 ml   Filed Weights    02/03/2021 1813 02/01/2021 2156  Weight: 90 kg 93 kg      REVIEW OF SYSTEMS  PATIENT IS UNABLE TO PROVIDE COMPLETE REVIEW OF SYSTEMS DUE TO SEVERE CRITICAL ILLNESS AND TOXIC METABOLIC ENCEPHALOPATHY  ALL OTHER ROS ARE NEGATIVE   PHYSICAL EXAMINATION:  GENERAL:critically ill appearing, +resp distress EYES: Pupils equal, round, reactive to light.  No scleral icterus.  MOUTH: Moist mucosal membrane. INTUBATED NECK: Supple.  PULMONARY: +rhonchi, +wheezing CARDIOVASCULAR: S1 and S2.  No murmurs  GASTROINTESTINAL: Soft, nontender, -distended. Positive bowel sounds.  MUSCULOSKELETAL: No swelling, clubbing, or edema.  NEUROLOGIC: obtunded SKIN:intact,warm,dry    Labs/imaging that I havepersonally reviewed  (right click and "Reselect all SmartList Selections" daily)       ASSESSMENT AND PLAN SYNOPSIS  Severe ACUTE Hypoxic and Hypercapnic Respiratory Failure with septic shock on admission with severe pancreatitis and ARDS with  -continue Mechanical Ventilator support -continue Bronchodilator Therapy -Wean Fio2 and PEEP as tolerated -VAP/VENT bundle implementation -will NOT perform SAT/SBT when respiratory parameters are met  Vent Mode: PRVC FiO2 (%):  [60 %-100 %] 100 % Set Rate:  [16 bmp-24 bmp] 24 bmp Vt Set:  [450 mL-500 mL] 500 mL PEEP:  [5 cmH20-8 cmH20] 8 cmH20 Plateau Pressure:  [20 cmH20] 20 cmH20   CARDIAC ICU monitoring   ACUTE KIDNEY INJURY/Renal Failure -continue Foley Catheter-assess need -Avoid nephrotoxic agents -Follow urine output, BMP -Ensure adequate renal perfusion, optimize oxygenation -Renal dose medications Will likely need CRRT Continue bicarb infusion for severe acidosis  Intake/Output Summary (Last 24 hours) at 01/21/2021 0739 Last data filed at 01/21/2021 0600 Gross per 24 hour  Intake 5114.23 ml  Output 350 ml  Net 4764.23 ml     NEUROLOGY Acute toxic metabolic encephalopathy, need for sedation Goal RASS -2 to -3   SEPTIC  SHOCK SOURCE-pancreas,pneumonia -use vasopressors to keep MAP>65 as needed -follow ABG and LA -follow up cultures -emperic ABX -consider stress dose steroids   INFECTIOUS DISEASE -continue antibiotics as prescribed -follow up cultures  ENDO - ICU hypoglycemic\Hyperglycemia protocol -check FSBS per protocol   GI GI PROPHYLAXIS as indicated  NUTRITIONAL STATUS DIET-->NPO Constipation protocol as indicated   ELECTROLYTES -follow labs as needed -replace as needed -pharmacy consultation and following   Best Practice (right click and "Reselect all SmartList Selections" daily)  Diet/type: NPO w/ meds via tube DVT prophylaxis: prophylactic heparin  GI prophylaxis: H2B Lines: N/A, will be placing arterial & CVC Foley:  Yes, and it is still needed Code Status:  full code Last date of multidisciplinary goals of care discussion [01/14/2021]    Labs   CBC: Recent Labs  Lab 01/15/2021 1820 01/21/21 0435  WBC 13.6* 3.8*  NEUTROABS 11.6*  --   HGB 10.6* 11.2*  HCT 33.3* 33.1*  MCV 92.2 89.9  PLT 251 443    Basic Metabolic Panel: Recent Labs  Lab 01/30/2021 1820 01/21/21 0456  NA 133* 132*  K 3.9 4.3  CL 104 100  CO2 11* 18*  GLUCOSE 159* 188*  BUN 75* 77*  CREATININE 5.44* 4.80*  CALCIUM 6.5* 6.1*  MG  --  1.9  PHOS  --  10.0*   GFR: Estimated Creatinine Clearance: 14.4 mL/min (A) (by C-G formula  based on SCr of 4.8 mg/dL (H)). Recent Labs  Lab 01/25/2021 1820 01/19/2021 2308 01/21/21 0435 01/21/21 0456  PROCALCITON  --   --   --  4.15  WBC 13.6*  --  3.8*  --   LATICACIDVEN 4.1* 1.7  --   --     Liver Function Tests: Recent Labs  Lab 01/26/2021 1820 01/21/21 0456  AST 84* 166*  ALT 41 69*  ALKPHOS 72 80  BILITOT 0.7 1.1  PROT 5.5* 5.8*  ALBUMIN 2.7* 2.9*   Recent Labs  Lab 02/07/2021 1820 02/07/2021 2308 01/21/21 0456  LIPASE 86*  --  54*  AMYLASE  --  267*  --    Recent Labs  Lab 01/21/21 0456  AMMONIA 42*    ABG    Component Value  Date/Time   PHART 7.06 (LL) 01/21/2021 0456   PCO2ART 52 (H) 01/21/2021 0456   PO2ART 59 (L) 01/21/2021 0456   HCO3 14.7 (L) 01/21/2021 0456   ACIDBASEDEF 15.8 (H) 01/21/2021 0456   O2SAT 75.9 01/21/2021 0456     Coagulation Profile: Recent Labs  Lab 01/29/2021 1820  INR 1.6*    Cardiac Enzymes: No results for input(s): CKTOTAL, CKMB, CKMBINDEX, TROPONINI in the last 168 hours.  HbA1C: Hgb A1c MFr Bld  Date/Time Value Ref Range Status  02/01/2021 09:22 PM 5.9 (H) 4.8 - 5.6 % Final    Comment:    (NOTE) Pre diabetes:          5.7%-6.4%  Diabetes:              >6.4%  Glycemic control for   <7.0% adults with diabetes     CBG: Recent Labs  Lab 01/18/2021 2307 01/21/21 0334 01/21/21 0721  GLUCAP 225* 198* 122*    Allergies No Known Allergies     DVT/GI PRX  assessed I Assessed the need for Labs I Assessed the need for Foley I Assessed the need for Central Venous Line Family Discussion when available I Assessed the need for Mobilization I made an Assessment of medications to be adjusted accordingly Safety Risk assessment completed  CASE DISCUSSED IN MULTIDISCIPLINARY ROUNDS WITH ICU TEAM     Critical Care Time devoted to patient care services described in this note is 65 minutes.  Critical care was necessary to treat or prevent imminent or life-threatening deterioration.   PATIENT WITH VERY POOR PROGNOSIS I ANTICIPATE PROLONGED ICU LOS  Patient with Multiorgan failure and at high risk for cardiac arrest and death.    Corrin Parker, M.D.  [facility]  Medical Director [place] Director [facility] Cardio-Pulmonary Department

## 2021-01-21 NOTE — Plan of Care (Signed)
  Problem: Clinical Measurements: Goal: Ability to maintain clinical measurements within normal limits will improve Outcome: Progressing   

## 2021-01-21 NOTE — Progress Notes (Signed)
CRRT started this shift. Some issues with CRRT circuit clotting. Managed to return blood on both occasion that the circuit clotted. Systemic heparin started as per discussion with Dr Mortimer Fries. Will continue to monitor.

## 2021-01-21 NOTE — Progress Notes (Signed)
ANTICOAGULATION CONSULT NOTE  Pharmacy Consult for heparin Indication:  CRRT clotting issues  No Known Allergies  Patient Measurements: Height: 5\' 7"  (170.2 cm) Weight: 93 kg (205 lb 0.4 oz) IBW/kg (Calculated) : 61.6 Heparin Dosing Weight: 81 kg  Vital Signs: Temp: 97.2 F (36.2 C) (01/09 1400) Temp Source: Bladder (01/09 1200) BP: 109/44 (01/09 1400) Pulse Rate: 74 (01/09 1400)  Labs: Recent Labs    02/03/2021 1820 01/14/2021 2122 01/21/21 0435 01/21/21 0456  HGB 10.6*  --  11.2*  --   HCT 33.3*  --  33.1*  --   PLT 251  --  265  --   APTT 41*  --   --   --   LABPROT 19.1*  --   --   --   INR 1.6*  --   --   --   CREATININE 5.44*  --   --  4.80*  TROPONINIHS 14 14  --   --     Estimated Creatinine Clearance: 14.4 mL/min (A) (by C-G formula based on SCr of 4.8 mg/dL (H)).   Medical History: Past Medical History:  Diagnosis Date   Bipolar 1 disorder ([place])    Chronic pain    COPD (chronic obstructive pulmonary disease) (H[facility])    Depression    Diabetes mellitus without complication ([place])    Drug abuse ([place])    Hypertension       Assessment: 61 year old female presented with respiratory failure, multifactorial requiring intubation. Creatinine elevated on admission, patient to start on CRRT. Pharmacy consulted for heparin management for CRRT clotting issues.  Goal of Therapy:  Heparin level 0.3-0.5 units/ml or lower if no clotting issues reported Monitor platelets by anticoagulation protocol: Yes   Plan:  --Start heparin infusion at 950 units/hr --Target HL on low end of therapeutic or even subtherapeutic as long as no clotting issues reported per nursing --Check HL at 2300 --CBC daily while on heparin  Tawnya Crook, PharmD, BCPS Clinical Pharmacist 01/21/2021 3:22 PM

## 2021-01-21 NOTE — Consult Note (Signed)
[facility] CONSULT NOTE    Date: 01/21/2021                  Patient Name:  Victoria Frey  MRN: [id number]  DOB: 11/17/1960  Age / Sex: 61 y.o., female         PCP: Denton Lank, MD                 Service Requesting Consult: Critical care                 Reason for Consult: Acute kidney injury            History of Present Illness: Patient is a 61 y.o. female with a PMHx of hypertension, COPD, tobacco abuse, diabetes mellitus type 2, bipolar disorder, depression, who was admitted to [facility] on 01/16/2021 for evaluation of bradycardia and unresponsiveness.  Patient unable to offer any history at this time as she is currently intubated and sedated.  Apparently her family called EMS on 01/25/2021 secondary to confusion and slurred speech.  During transport to the hospital she became unconscious and bradycardic in the 30s.  Patient required transcutaneous pacing and was intubated in the field.  She is now found to have acute pancreatitis and has developed multiorgan failure as a result.  Her creatinine is up to 4.8 with a urine output of only 250 cc over the preceding 24 hours.  Patient maintained on norepinephrine drip as well.  She is also on the ventilator at this time and requiring significant ventilatory support with FiO2 of 100%.      Medications: Outpatient medications: Medications Prior to Admission  Medication Sig Dispense Refill Last Dose   albuterol (VENTOLIN HFA) 108 (90 Base) MCG/ACT inhaler SMARTSIG:2 Inhalation By Mouth Every 4-6 Hours PRN      amLODipine (NORVASC) 10 MG tablet Take 10 mg by mouth daily.      budesonide-formoterol (SYMBICORT) 160-4.5 MCG/ACT inhaler Inhale into the lungs.      busPIRone (BUSPAR) 5 MG tablet Take by mouth.      FLUoxetine (PROZAC) 40 MG capsule Take 40 mg by mouth daily.      gabapentin (NEURONTIN) 600 MG tablet Take 600 mg by mouth 3 (three) times daily.      hydrOXYzine (ATARAX/VISTARIL) 25 MG tablet Take 25 mg by mouth  3 (three) times daily.      ibuprofen (ADVIL) 800 MG tablet Take 1 tablet (800 mg total) by mouth every 8 (eight) hours as needed for moderate pain. 15 tablet 0    lactulose, encephalopathy, (CHRONULAC) 10 GM/15ML SOLN Take 10 g by mouth 2 (two) times daily.      lip balm (CARMEX) ointment Apply 1 application topically as needed for lip care. 7 g 0    losartan (COZAAR) 50 MG tablet Take 50 mg by mouth daily.      metFORMIN (GLUCOPHAGE) 500 MG tablet Take 500 mg by mouth 2 (two) times daily.      mupirocin ointment (BACTROBAN) 2 % Apply topically.      omeprazole (PRILOSEC) 20 MG capsule Take 20 mg by mouth daily.      Plecanatide (TRULANCE) 3 MG TABS Take 3 mg by mouth daily. 90 tablet 1    QUEtiapine (SEROQUEL) 100 MG tablet Take 200 mg by mouth at bedtime.      QUEtiapine (SEROQUEL) 400 MG tablet Take 400 mg by mouth at bedtime.      rOPINIRole (REQUIP) 0.25 MG tablet Take by mouth.  Current medications: Current Facility-Administered Medications  Medication Dose Route Frequency Provider Last Rate Last Admin   0.9 %  sodium chloride infusion  250 mL Intravenous Continuous Dorothe Pea, RPH   Stopped at 01/21/21 0537   budesonide (PULMICORT) nebulizer solution 0.25 mg  0.25 mg Nebulization BID Rust-Chester, Huel Cote, NP   0.25 mg at 01/21/21 0818   chlorhexidine gluconate (MEDLINE KIT) (PERIDEX) 0.12 % solution 15 mL  15 mL Mouth Rinse BID Rust-Chester, Toribio Harbour L, NP   15 mL at 01/21/21 0802   Chlorhexidine Gluconate Cloth 2 % PADS 6 each  6 each Topical Q0600 Ottie Glazier, MD       docusate (COLACE) 50 MG/5ML liquid 100 mg  100 mg Per Tube BID Rust-Chester, Toribio Harbour L, NP       docusate sodium (COLACE) capsule 100 mg  100 mg Oral BID PRN Rust-Chester, Huel Cote, NP       famotidine (PEPCID) IVPB 20 mg premix  20 mg Intravenous QODAY Rauer, Forde Dandy, RPH   Stopped at 01/16/2021 2338   fentaNYL (SUBLIMAZE) bolus via infusion 50-100 mcg  50-100 mcg Intravenous Q2H PRN Rust-Chester,  Britton L, NP       fentaNYL 255mg in NS 2544m(1015mml) infusion-PREMIX  0-200 mcg/hr Intravenous Continuous Rust-Chester, Britton L, NP 5 mL/hr at 01/21/21 0800 50 mcg/hr at 01/21/21 0800   heparin injection 5,000 Units  5,000 Units Subcutaneous Q8H Rust-Chester, Britton L, NP   5,000 Units at 01/21/21 0528   insulin aspart (novoLOG) injection 0-15 Units  0-15 Units Subcutaneous Q4H Rust-Chester, Britton L, NP   2 Units at 01/21/21 0736   ipratropium-albuterol (DUONEB) 0.5-2.5 (3) MG/3ML nebulizer solution 3 mL  3 mL Nebulization Q6H Rust-Chester, Britton L, NP   3 mL at 01/21/21 0818   lactulose (CHRONULAC) 10 GM/15ML solution 10 g  10 g Per Tube BID Rust-Chester, BriToribio Harbour NP       MEDLINE mouth rinse  15 mL Mouth Rinse 10 times per day Rust-Chester, Britton L, NP   15 mL at 01/21/21 0520   midazolam (VERSED) 100 mg/100 mL (1 mg/mL) premix infusion  0.5-10 mg/hr Intravenous Continuous StaCarrie MewD 2 mL/hr at 01/21/21 0800 2 mg/hr at 01/21/21 0800   midazolam (VERSED) bolus via infusion 2-4 mg  2-4 mg Intravenous Q2H PRN Rust-Chester, BriHuel CoteP       mupirocin ointment (BACTROBAN) 2 % 1 application  1 application Nasal BID Rust-Chester, BriHuel CoteP   1 application at 01/74/16/3845   norepinephrine (LEVOPHED) 16 mg in 250m47memix infusion  0-40 mcg/min Intravenous Titrated DolaDorothe PeaH 37.5 mL/hr at 01/21/21 0800 40 mcg/min at 01/21/21 0800   piperacillin-tazobactam (ZOSYN) IVPB 2.25 g  2.25 g Intravenous Q8H Rauer, Samantha O, RPH 100 mL/hr at 01/21/21 0800 Infusion Verify at 01/21/21 0800   polyethylene glycol (MIRALAX / GLYCOLAX) packet 17 g  17 g Oral Daily PRN Rust-Chester, BritHuel Cote       polyethylene glycol (MIRALAX / GLYCOLAX) packet 17 g  17 g Per Tube Daily Rust-Chester, Britton L, NP       sodium bicarbonate 150 mEq in sterile water 1,150 mL infusion   Intravenous Continuous Rust-Chester, Britton L, NP 125 mL/hr at 01/21/21 0803 New Bag at 01/21/21 0803    vancomycin variable dose per unstable renal function (pharmacist dosing)   Does not apply See admin instructions Rauer, SamaForde DandyH       vasopressin (PITRESSIN) 20 Units in sodium  chloride 0.9 % 100 mL infusion-*FOR SHOCK*  0-0.04 Units/min Intravenous Continuous Flora Lipps, MD 12 mL/hr at 01/21/21 0848 0.04 Units/min at 01/21/21 0848      Allergies: No Known Allergies    Past Medical History: Past Medical History:  Diagnosis Date   Bipolar 1 disorder (HCC)    Chronic pain    COPD (chronic obstructive pulmonary disease) (HCC)    Depression    Diabetes mellitus without complication ([place])    Drug abuse ([place])    Hypertension      Past Surgical History: Past Surgical History:  Procedure Laterality Date   BACK SURGERY     CARPAL TUNNEL RELEASE     CATARACT EXTRACTION W/PHACO Right 10/29/2020   Procedure: CATARACT EXTRACTION PHACO AND INTRAOCULAR LENS PLACEMENT ([place]) RIGHT DIABETIC;  Surgeon: Eulogio Bear, MD;  Location: [place];  Service: Ophthalmology;  Laterality: Right;  3.35 0:21.5   CATARACT EXTRACTION W/PHACO Left 11/12/2020   Procedure: CATARACT EXTRACTION PHACO AND INTRAOCULAR LENS PLACEMENT ([place]) LEFT DIABETIC;  Surgeon: Eulogio Bear, MD;  Location: [place];  Service: Ophthalmology;  Laterality: Left;  00:11.2 1.26   COLONOSCOPY WITH PROPOFOL N/A 10/23/2020   Procedure: COLONOSCOPY WITH PROPOFOL;  Surgeon: Virgel Manifold, MD;  Location: [place];  Service: Endoscopy;  Laterality: N/A;   ESOPHAGEAL DILATION N/A 10/23/2020   Procedure: ESOPHAGEAL DILATION;  Surgeon: Virgel Manifold, MD;  Location: [place];  Service: Endoscopy;  Laterality: N/A;   ESOPHAGOGASTRODUODENOSCOPY (EGD) WITH PROPOFOL N/A 10/23/2020   Procedure: ESOPHAGOGASTRODUODENOSCOPY (EGD) WITH PROPOFOL;  Surgeon: Virgel Manifold, MD;  Location: [place];  Service: Endoscopy;  Laterality: N/A;  Prediabetic   POLYPECTOMY  N/A 10/23/2020   Procedure: POLYPECTOMY;  Surgeon: Virgel Manifold, MD;  Location: [place];  Service: Endoscopy;  Laterality: N/A;   SKIN CANCER EXCISION     TUMOR REMOVAL       Family History: Family History  Problem Relation Age of Onset   Breast cancer Sister 8     Social History: Social History   Socioeconomic History   Marital status: Widowed    Spouse name: Not on file   Number of children: Not on file   Years of education: Not on file   Highest education level: Not on file  Occupational History   Not on file  Tobacco Use   Smoking status: Every Day    Packs/day: 1.00    Years: 45.00    Pack years: 45.00    Types: Cigarettes   Smokeless tobacco: Never   Tobacco comments:    Started as teenager  Vaping Use   Vaping Use: Never used  Substance and Sexual Activity   Alcohol use: Yes    Comment: occ   Drug use: Yes    Types: Cocaine    Comment: reports prescribed hydrocodone   Sexual activity: Yes    Birth control/protection: None  Other Topics Concern   Not on file  Social History Narrative   Not on file   Social Determinants of Health   Financial Resource Strain: Not on file  Food Insecurity: Not on file  Transportation Needs: Not on file  Physical Activity: Not on file  Stress: Not on file  Social Connections: Not on file  Intimate Partner Violence: Not on file     Review of Systems: Patient unable to provide review of systems as she is currently intubated and sedated   Vital Signs: Blood pressure (!) 112/44, pulse  81, temperature (!) 101.1 F (38.4 C), resp. rate (!) 24, height '5\' 7"'  (1.702 m), weight 93 kg, SpO2 93 %.  Weight trends: Filed Weights   02/07/2021 1813 02/06/2021 2156  Weight: 90 kg 93 kg     Physical Exam: General: Critically ill-appearing  Head: Normocephalic, atraumatic.  Endotracheal tube in place  Eyes: Anicteric  Neck: Supple  Lungs:  Scattered rhonchi, vent assisted  Heart: S1S2 no rubs   Abdomen:  Distention noted, scant bowel sounds  Extremities: Trace peripheral edema.  Neurologic: Intubated, sedated  Skin: No acute rash  Access: No dialysis access present    Lab results: Basic Metabolic Panel: Recent Labs  Lab 02/02/2021 1820 01/21/21 0456  NA 133* 132*  K 3.9 4.3  CL 104 100  CO2 11* 18*  GLUCOSE 159* 188*  BUN 75* 77*  CREATININE 5.44* 4.80*  CALCIUM 6.5* 6.1*  MG  --  1.9  PHOS  --  10.0*    Liver Function Tests: Recent Labs  Lab 01/30/2021 1820 01/21/21 0456  AST 84* 166*  ALT 41 69*  ALKPHOS 72 80  BILITOT 0.7 1.1  PROT 5.5* 5.8*  ALBUMIN 2.7* 2.9*   Recent Labs  Lab 02/07/2021 1820 02/09/2021 2308 01/21/21 0456  LIPASE 86*  --  54*  AMYLASE  --  267*  --    Recent Labs  Lab 01/21/21 0456  AMMONIA 42*    CBC: Recent Labs  Lab 01/15/2021 1820 01/21/21 0435  WBC 13.6* 3.8*  NEUTROABS 11.6*  --   HGB 10.6* 11.2*  HCT 33.3* 33.1*  MCV 92.2 89.9  PLT 251 265    Cardiac Enzymes: No results for input(s): CKTOTAL, CKMB, CKMBINDEX, TROPONINI in the last 168 hours.  BNP: Invalid input(s): POCBNP  CBG: Recent Labs  Lab 02/11/2021 2152 02/11/2021 2307 01/21/21 0334 01/21/21 0721  GLUCAP 244* 225* 198* 122*    Microbiology: Results for orders placed or performed during the hospital encounter of 02/11/2021  Resp Panel by RT-PCR (Flu A&B, Covid) Nasopharyngeal Swab     Status: None   Collection Time: 01/24/2021  6:20 PM   Specimen: Nasopharyngeal Swab; Nasopharyngeal(NP) swabs in vial transport medium  Result Value Ref Range Status   SARS Coronavirus 2 by RT PCR NEGATIVE NEGATIVE Final    Comment: (NOTE) SARS-CoV-2 target nucleic acids are NOT DETECTED.  The SARS-CoV-2 RNA is generally detectable in upper respiratory specimens during the acute phase of infection. The lowest concentration of SARS-CoV-2 viral copies this assay can detect is 138 copies/mL. A negative result does not preclude SARS-Cov-2 infection and should not be  used as the sole basis for treatment or other patient management decisions. A negative result may occur with  improper specimen collection/handling, submission of specimen other than nasopharyngeal swab, presence of viral mutation(s) within the areas targeted by this assay, and inadequate number of viral copies(<138 copies/mL). A negative result must be combined with clinical observations, patient history, and epidemiological information. The expected result is Negative.  Fact Sheet for Patients:  EntrepreneurPulse.com.au  Fact Sheet for Healthcare Providers:  IncredibleEmployment.be  This test is no t yet approved or cleared by the [place] FDA and  has been authorized for detection and/or diagnosis of SARS-CoV-2 by FDA under an Emergency Use Authorization (EUA). This EUA will remain  in effect (meaning this test can be used) for the duration of the COVID-19 declaration under Section 564(b)(1) of the Act, 21 U.S.C.section 360bbb-3(b)(1), unless the authorization is terminated  or revoked sooner.  Influenza A by PCR NEGATIVE NEGATIVE Final   Influenza B by PCR NEGATIVE NEGATIVE Final    Comment: (NOTE) The Xpert Xpress SARS-CoV-2/FLU/RSV plus assay is intended as an aid in the diagnosis of influenza from Nasopharyngeal swab specimens and should not be used as a sole basis for treatment. Nasal washings and aspirates are unacceptable for Xpert Xpress SARS-CoV-2/FLU/RSV testing.  Fact Sheet for Patients: EntrepreneurPulse.com.au  Fact Sheet for Healthcare Providers: IncredibleEmployment.be  This test is not yet approved or cleared by the [place] FDA and has been authorized for detection and/or diagnosis of SARS-CoV-2 by FDA under an Emergency Use Authorization (EUA). This EUA will remain in effect (meaning this test can be used) for the duration of the COVID-19 declaration under Section  564(b)(1) of the Act, 21 U.S.C. section 360bbb-3(b)(1), unless the authorization is terminated or revoked.  Performed at [facility], [address] [place], [place] [address]   Blood Culture (routine x 2)     Status: None (Preliminary result)   Collection Time: 01/24/2021  6:23 PM   Specimen: Left Antecubital; Blood  Result Value Ref Range Status   Specimen Description LEFT ANTECUBITAL  Final   Special Requests   Final    BOTTLES DRAWN AEROBIC AND ANAEROBIC Blood Culture results may not be optimal due to an inadequate volume of blood received in culture bottles   Culture   Final    NO GROWTH < 12 HOURS Performed at [facility], [address] [place], [place] [address]    Report Status PENDING  Incomplete  MRSA Next Gen by PCR, Nasal     Status: Abnormal   Collection Time: 02/09/2021  9:59 PM   Specimen: Nasal Mucosa; Nasal Swab  Result Value Ref Range Status   MRSA by PCR Next Gen DETECTED (A) NOT DETECTED Final    Comment: RESULT CALLED TO, READ BACK BY AND VERIFIED WITH: '@[id number]'  Melissa Cobb 01/22/2021 (NOTE) The GeneXpert MRSA Assay (FDA approved for NASAL specimens only), is one component of a comprehensive MRSA colonization surveillance program. It is not intended to diagnose MRSA infection nor to guide or monitor treatment for MRSA infections. Test performance is not FDA approved in patients less than 73 years old. Performed at [facility], [address] [place], [place] [address]     Coagulation Studies: Recent Labs    01/19/2021 1820  LABPROT 19.1*  INR 1.6*    Urinalysis: Recent Labs    01/28/2021 1822  COLORURINE AMBER*  LABSPEC 1.020  PHURINE 5.0  GLUCOSEU NEGATIVE  HGBUR NEGATIVE  BILIRUBINUR NEGATIVE  KETONESUR NEGATIVE  PROTEINUR 30*  NITRITE NEGATIVE  LEUKOCYTESUR NEGATIVE      Imaging: DG Abd 1 View  Result Date: 01/21/2021 CLINICAL DATA:  Left femoral line placement EXAM: ABDOMEN - 1 VIEW COMPARISON:  None.  FINDINGS: Left femoral line is in place with the tip in the upper pelvis, likely in the left common iliac vein. Gas within large and small bowel. No acute bony abnormality. IMPRESSION: Left central line tip in the upper pelvis, likely in the left common iliac vein. Electronically Signed   By: Rolm Baptise M.D.   On: 01/21/2021 00:17   DG Abd 1 View  Result Date: 02/03/2021 CLINICAL DATA:  OG tube placement EXAM: ABDOMEN - 1 VIEW COMPARISON:  02/01/2021 FINDINGS: Esophageal tube tip now overlies the gastroduodenal region. Mild diffuse gaseous dilatation of bowel potentially due to a mild ileus. Right femoral catheter is looped back upon itself with the  tip directed caudal and beyond the image in the region of the right groin. IMPRESSION: 1. Esophageal tube tip overlies the gastroduodenal region 2. Right femoral catheter appears looped back upon itself at the right groin with the tip directed caudal but not included on the image 3. Probable ileus These results will be called to the ordering clinician or representative by the Radiologist Assistant, and communication documented in the PACS or [employer]. Electronically Signed   By: Donavan Foil M.D.   On: 01/16/2021 23:30   DG Abd 1 View  Result Date: 02/05/2021 CLINICAL DATA:  OG tube placement. EXAM: ABDOMEN - 1 VIEW COMPARISON:  None. FINDINGS: The tip of the enteric tube is in the distal esophagus, side-port in the mid esophagus. Advancement of at least 15 cm is recommended to place the side-port below the diaphragm. Multiple overlying monitoring devices. IMPRESSION: Tip of the enteric tube in the distal esophagus, side-port in the mid esophagus. Advancement of at least 15 cm is recommended to place the side-port below the diaphragm. Electronically Signed   By: Keith Rake M.D.   On: 02/06/2021 19:30   CT Head Wo Contrast  Result Date: 02/09/2021 CLINICAL DATA:  Mental status change, unknown cause EXAM: CT HEAD WITHOUT CONTRAST TECHNIQUE:  Contiguous axial images were obtained from the base of the skull through the vertex without intravenous contrast. COMPARISON:  Head CT 10/15/2018 FINDINGS: Brain: Brain volume is normal for age. No intracranial hemorrhage, mass effect, or midline shift. Gray-white differentiation is preserved. No hydrocephalus. The basilar cisterns are patent. No evidence of territorial infarct or acute ischemia. No extra-axial or intracranial fluid collection. Vascular: No hyperdense vessel or unexpected calcification. Skull: No fracture or focal lesion. Sinuses/Orbits: Mild mucosal thickening of right side of sphenoid sinus. Remaining paranasal sinuses are clear. No acute orbital findings. Mastoid air cells are clear. Other: None. IMPRESSION: No acute intracranial abnormality. Electronically Signed   By: Keith Rake M.D.   On: 01/26/2021 20:22   DG Chest Port 1 View  Result Date: 01/24/2021 CLINICAL DATA:  Questionable sepsis EXAM: PORTABLE CHEST 1 VIEW COMPARISON:  11/06/2010 FINDINGS: Endotracheal tube is 4 cm above the carina. NG tube is in the distal esophagus. Cardiomegaly. Bilateral airspace disease, favor edema. No effusions or acute bony abnormality. IMPRESSION: Diffuse bilateral airspace disease, favor edema/CHF. Endotracheal tube in expected position. NG tube tip in the distal esophagus. Electronically Signed   By: Rolm Baptise M.D.   On: 02/07/2021 19:28   CT CHEST ABDOMEN PELVIS WO CONTRAST  Result Date: 02/04/2021 CLINICAL DATA:  Sepsis. EXAM: CT CHEST, ABDOMEN AND PELVIS WITHOUT CONTRAST TECHNIQUE: Multidetector CT imaging of the chest, abdomen and pelvis was performed following the standard protocol without IV contrast. COMPARISON:  Chest and abdominal x-rays from same day. Abdominal ultrasound dated February 11, 2019. CT abdomen and pelvis dated September 18, 2006. FINDINGS: CT CHEST FINDINGS Cardiovascular: No significant vascular findings. Mild cardiomegaly. No pericardial effusion. No thoracic aortic  aneurysm. Coronary, aortic arch, and branch vessel atherosclerotic vascular disease. Mediastinum/Nodes: No enlarged mediastinal, hilar, or axillary lymph nodes. Thyroid gland, trachea, and esophagus demonstrate no significant findings. Lungs/Pleura: Endotracheal tube tip 2.6 cm above the carina. Moderate upper lobe predominant centrilobular emphysema. Patchy ground-glass densities and consolidation throughout both lungs. Trace bilateral pleural effusions. No pneumothorax. Musculoskeletal: No acute or significant osseous findings. CT ABDOMEN PELVIS FINDINGS Hepatobiliary: No focal liver abnormality. Distended gallbladder without radiopaque gallstones, wall thickening, or biliary dilatation. Pancreas: Mild peripancreatic inflammatory change without fluid collection. No ductal  dilatation. Spleen: Normal in size without focal abnormality. Adrenals/Urinary Tract: The adrenal glands are unremarkable. Punctate bilateral renal calculi. No hydronephrosis. The bladder is decompressed by Foley catheter. Stomach/Bowel: Enteric tube in the stomach. The stomach is within normal limits. No bowel wall thickening, distention, or surrounding inflammatory changes. Normal appendix with appendicoliths. Vascular/Lymphatic: Aortic atherosclerosis. No enlarged abdominal or pelvic lymph nodes. Reproductive: Status post hysterectomy. No adnexal masses. Other: No abdominal wall hernia or abnormality. No abdominopelvic ascites. No pneumoperitoneum. Musculoskeletal: No acute or significant osseous findings. IMPRESSION: Chest: 1. Patchy ground-glass densities and consolidation throughout both lungs, concerning for multifocal pneumonia. Alternatively, this could reflect ARDS in the setting of pancreatitis. 2. Trace bilateral pleural effusions. 3. Aortic Atherosclerosis (ICD10-I70.0) and Emphysema (ICD10-J43.9). Abdomen and pelvis: 1. Acute pancreatitis.  No peripancreatic fluid collection. 2. Bilateral nonobstructive nephrolithiasis.  Electronically Signed   By: Titus Dubin M.D.   On: 01/30/2021 20:37     Assessment & Plan: Pt is a 61 y.o. female Patient is a 61 y.o. female with a PMHx of hypertension, COPD, tobacco abuse, diabetes mellitus type 2, bipolar disorder, depression, who was admitted to [facility] on 02/11/2021 for evaluation of bradycardia and unresponsiveness.  Patient found to have pancreatitis, acute respiratory failure and multiorgan failure.  1.  Acute kidney injury secondary to acute tubular necrosis.  Urine output was only 250 cc over the preceding 24 hours and creatinine up to 4.8.  Patient with multiorgan failure.  Therefore we would recommend initiation of CRRT at this time.  Patient's son was contacted and we discussed risk, benefits, and alternatives to renal placement therapy.  He agrees to proceed.  We will ask critical care to place temporary dialysis catheter and then move forward with CRRT.  2.  Acute respiratory failure.  Patient maintained on the ventilator.  PCO2 currently 63 with pH of 7.05.  Continue ventilatory support.  3.  Metabolic acidosis.  Appears to be both components of respiratory and metabolic acidosis.  pH 7.05 at the moment.  Currently on bicarbonate drip.  CRRT should help to correct the acidosis as well.  4.  Hypotension.  Continue pressors to maintain a map of 65 or greater.

## 2021-01-21 NOTE — Progress Notes (Signed)
Inpatient Diabetes Program Recommendations  AACE/ADA: New Consensus Statement on Inpatient Glycemic Control (2015)  Target Ranges:  Prepandial:   less than 140 mg/dL      Peak postprandial:   less than 180 mg/dL (1-2 hours)      Critically ill patients:  140 - 180 mg/dL   Lab Results  Component Value Date   GLUCAP 195 (H) 01/21/2021   HGBA1C 5.9 (H) 02/10/2021    Review of Glycemic Control  Latest Reference Range & Units 01/26/2021 23:07 01/21/21 03:34 01/21/21 07:21 01/21/21 11:28 01/21/21 12:08  Glucose-Capillary 70 - 99 mg/dL 225 (H) 198 (H) Novolog 3 units  122 (H) Novolog 2 units 76 195 (H)  (H): Data is abnormally high Diabetes history: DM2 Outpatient Diabetes medications: Metformin 500 mg bid Current orders for Inpatient glycemic control: Novolog correction 0-15 units q 4 hrs.  Inpatient Diabetes Program Recommendations:   While patient on vent please consider: -ICU glycemic control orders phase 1  Thank you, Bethena Roys E. Lucion Dilger, RN, MSN, CDE  Diabetes Coordinator Inpatient Glycemic Control Team Team Pager [phone] (8am-5pm) 01/21/2021 1:51 PM

## 2021-01-21 NOTE — Progress Notes (Signed)
PHARMACY NOTE:  ANTIMICROBIAL RENAL DOSAGE ADJUSTMENT  Current antimicrobial regimen includes a mismatch between antimicrobial dosage and estimated renal function.  As per policy approved by the Pharmacy & Therapeutics and Medical Executive Committees, the antimicrobial dosage will be adjusted accordingly.  Current antimicrobial dosage:  Zosyn 2.25 g IV q6h  Indication: sepsis  Renal Function:  Estimated Creatinine Clearance: 14.4 mL/min (A) (by C-G formula based on SCr of 4.8 mg/dL (H)). []      On intermittent HD, scheduled: [x]      On CRRT    Antimicrobial dosage has been changed to:  Zosyn 3.375 g IV q6h (for ease of access discussed with nurse)  Additional comments: Start vancomycin 1000 mg (~ 11 mg/kg) q24h starting tonight   Thank you for allowing pharmacy to be a part of this patient's care.   Tawnya Crook, PharmD, BCPS Clinical Pharmacist 01/21/2021 3:31 PM

## 2021-01-22 ENCOUNTER — Inpatient Hospital Stay: Payer: Medicare Other

## 2021-01-22 DIAGNOSIS — R4189 Other symptoms and signs involving cognitive functions and awareness: Secondary | ICD-10-CM | POA: Diagnosis not present

## 2021-01-22 LAB — GLUCOSE, CAPILLARY
Glucose-Capillary: 105 mg/dL — ABNORMAL HIGH (ref 70–99)
Glucose-Capillary: 113 mg/dL — ABNORMAL HIGH (ref 70–99)
Glucose-Capillary: 122 mg/dL — ABNORMAL HIGH (ref 70–99)
Glucose-Capillary: 82 mg/dL (ref 70–99)
Glucose-Capillary: 85 mg/dL (ref 70–99)
Glucose-Capillary: 96 mg/dL (ref 70–99)

## 2021-01-22 LAB — CBC
HCT: 28 % — ABNORMAL LOW (ref 36.0–46.0)
Hemoglobin: 9.7 g/dL — ABNORMAL LOW (ref 12.0–15.0)
MCH: 29.8 pg (ref 26.0–34.0)
MCHC: 34.6 g/dL (ref 30.0–36.0)
MCV: 86.2 fL (ref 80.0–100.0)
Platelets: 134 10*3/uL — ABNORMAL LOW (ref 150–400)
RBC: 3.25 MIL/uL — ABNORMAL LOW (ref 3.87–5.11)
RDW: 14.9 % (ref 11.5–15.5)
WBC: 5.1 10*3/uL (ref 4.0–10.5)
nRBC: 0 % (ref 0.0–0.2)

## 2021-01-22 LAB — BLOOD GAS, ARTERIAL
Acid-Base Excess: 4.2 mmol/L — ABNORMAL HIGH (ref 0.0–2.0)
Bicarbonate: 31.4 mmol/L — ABNORMAL HIGH (ref 20.0–28.0)
FIO2: 0.6
MECHVT: 500 mL
O2 Saturation: 89.7 %
PEEP: 8 cmH2O
Patient temperature: 37
RATE: 24 resp/min
pCO2 arterial: 61 mmHg — ABNORMAL HIGH (ref 32.0–48.0)
pH, Arterial: 7.32 — ABNORMAL LOW (ref 7.350–7.450)
pO2, Arterial: 63 mmHg — ABNORMAL LOW (ref 83.0–108.0)

## 2021-01-22 LAB — RENAL FUNCTION PANEL
Albumin: 2.7 g/dL — ABNORMAL LOW (ref 3.5–5.0)
Albumin: 2.5 g/dL — ABNORMAL LOW (ref 3.5–5.0)
Anion gap: 10 (ref 5–15)
Anion gap: 8 (ref 5–15)
BUN: 36 mg/dL — ABNORMAL HIGH (ref 8–23)
BUN: 23 mg/dL (ref 8–23)
CO2: 25 mmol/L (ref 22–32)
CO2: 27 mmol/L (ref 22–32)
Calcium: 6.8 mg/dL — ABNORMAL LOW (ref 8.9–10.3)
Calcium: 6.9 mg/dL — ABNORMAL LOW (ref 8.9–10.3)
Chloride: 99 mmol/L (ref 98–111)
Chloride: 99 mmol/L (ref 98–111)
Creatinine, Ser: 2.6 mg/dL — ABNORMAL HIGH (ref 0.44–1.00)
Creatinine, Ser: 1.87 mg/dL — ABNORMAL HIGH (ref 0.44–1.00)
GFR, Estimated: 20 mL/min — ABNORMAL LOW (ref >60)
GFR, Estimated: 30 mL/min — ABNORMAL LOW (ref >60)
Glucose, Bld: 104 mg/dL — ABNORMAL HIGH (ref 70–99)
Glucose, Bld: 99 mg/dL (ref 70–99)
Phosphorus: 3.7 mg/dL (ref 2.5–4.6)
Phosphorus: 3 mg/dL (ref 2.5–4.6)
Potassium: 4 mmol/L (ref 3.5–5.1)
Potassium: 3.9 mmol/L (ref 3.5–5.1)
Sodium: 134 mmol/L — ABNORMAL LOW (ref 135–145)
Sodium: 134 mmol/L — ABNORMAL LOW (ref 135–145)

## 2021-01-22 LAB — URINE CULTURE: Culture: NO GROWTH

## 2021-01-22 LAB — PROCALCITONIN: Procalcitonin: 4.18 ng/mL

## 2021-01-22 LAB — HEPARIN LEVEL (UNFRACTIONATED): Heparin Unfractionated: 0.23 IU/mL — ABNORMAL LOW (ref 0.30–0.70)

## 2021-01-22 LAB — MAGNESIUM: Magnesium: 2.2 mg/dL (ref 1.7–2.4)

## 2021-01-22 MED ORDER — VITAL HIGH PROTEIN PO LIQD
1000.0000 mL | ORAL | Status: DC
  Administered 2021-01-22: 1000 mL

## 2021-01-22 MED ORDER — RENA-VITE PO TABS
1.0000 | ORAL_TABLET | Freq: Every day | ORAL | Status: DC
  Administered 2021-01-22 – 2021-01-26 (×5): 1
  Filled 2021-01-22 (×5): qty 1

## 2021-01-22 MED ORDER — PROSOURCE TF PO LIQD
45.0000 mL | Freq: Two times a day (BID) | ORAL | Status: DC
  Administered 2021-01-22 – 2021-01-25 (×6): 45 mL
  Filled 2021-01-22 (×7): qty 45

## 2021-01-22 MED ORDER — ADULT MULTIVITAMIN LIQUID CH
15.0000 mL | Freq: Every day | ORAL | Status: DC
  Administered 2021-01-23 – 2021-01-25 (×3): 15 mL
  Filled 2021-01-22 (×3): qty 15

## 2021-01-22 MED ORDER — FREE WATER
30.0000 mL | Status: DC
  Administered 2021-01-22 – 2021-01-27 (×32): 30 mL

## 2021-01-22 NOTE — Progress Notes (Signed)
GOALS OF CARE DISCUSSION  The Clinical status was relayed to family in detail. Daughter In law at bedside Updated and notified of patients medical condition.    Patient remains unresponsive and will not open eyes to command.   Patient is having a weak cough and struggling to remove secretions.   Patient with increased WOB and using accessory muscles to breathe Explained to family course of therapy and the modalities    Patient with Progressive multiorgan failure with a very high probablity of a very minimal chance of meaningful recovery despite all aggressive and optimal medical therapy.  PATIENT REMAINS FULL CODE  Family understands the situation.   Family are satisfied with Plan of action and management. All questions answered  Additional CC time 25 mins   Michah Minton Patricia Pesa, M.D.  [facility]  Medical Director [place] Director [facility] Cardio-Pulmonary Department

## 2021-01-22 NOTE — Progress Notes (Signed)
[facility]  ROUNDING NOTE   Subjective:  Patient remains critically ill at the moment. Still on CRRT. At some initial early clotting but appears to be improved. Case discussed with nursing. We will add 50 cc of ultrafiltration per hour today.   Objective:  Vital signs in last 24 hours:  Temp:  [96.1 F (35.6 C)-101.7 F (38.7 C)] 98.1 F (36.7 C) (01/10 0700) Pulse Rate:  [72-84] 78 (01/10 0700) Resp:  [12-25] 16 (01/10 0700) BP: (94-132)/(36-54) 103/43 (01/10 0700) SpO2:  [85 %-100 %] 100 % (01/10 0742) Arterial Line BP: (92-157)/(34-69) 116/51 (01/10 0700) FiO2 (%):  [60 %-100 %] 60 % (01/10 0742)  Weight change:  Filed Weights   01/30/2021 1813 01/15/2021 2156  Weight: 90 kg 93 kg    Intake/Output: I/O last 3 completed shifts: In: 10475.6 [I.V.:5220.4; NG/GT:270; IV Piggyback:4985.3] Out: 2336 [Urine:485; Emesis/NG output:200; Other:2583]   Intake/Output this shift:  No intake/output data recorded.  Physical Exam: General: Critically ill-appearing  Head: Normocephalic, atraumatic.  Endotracheal tube in place  Eyes: Anicteric  Neck: Supple  Lungs:  Scattered rhonchi, vent assisted  Heart: S1S2 no rubs  Abdomen:  Distention noted, scant bowel sounds  Extremities: Trace peripheral edema.  Neurologic: Intubated, sedated  Skin: No acute rash  Access: Left IJ temporary dialysis catheter    Basic Metabolic Panel: Recent Labs  Lab 01/15/2021 1820 01/21/21 0456 01/21/21 1545 01/21/21 2250 01/22/21 0503  NA 133* 132* 131* 133* 134*  K 3.9 4.3 4.1 3.7 4.0  CL 104 100 96* 97* 99  CO2 11* 18* 21* 25 25  GLUCOSE 159* 188* 125* 145* 104*  BUN 75* 77* 73* 50* 36*  CREATININE 5.44* 4.80* 4.75* 3.16* 2.60*  CALCIUM 6.5* 6.1* 5.7* 6.4* 6.8*  MG  --  1.9  --   --  2.2  PHOS  --  10.0* 9.7*  --  3.7    Liver Function Tests: Recent Labs  Lab 01/15/2021 1820 01/21/21 0456 01/21/21 1545 01/22/21 0503  AST 84* 166*  --   --   ALT 41 69*  --   --    ALKPHOS 72 80  --   --   BILITOT 0.7 1.1  --   --   PROT 5.5* 5.8*  --   --   ALBUMIN 2.7* 2.9* 2.9* 2.7*   Recent Labs  Lab 02/05/2021 1820 01/30/2021 2308 01/21/21 0456  LIPASE 86*  --  54*  AMYLASE  --  267*  --    Recent Labs  Lab 01/21/21 0456  AMMONIA 42*    CBC: Recent Labs  Lab 02/09/2021 1820 01/21/21 0435 01/22/21 0503  WBC 13.6* 3.8* 5.1  NEUTROABS 11.6*  --   --   HGB 10.6* 11.2* 9.7*  HCT 33.3* 33.1* 28.0*  MCV 92.2 89.9 86.2  PLT 251 265 134*    Cardiac Enzymes: No results for input(s): CKTOTAL, CKMB, CKMBINDEX, TROPONINI in the last 168 hours.  BNP: Invalid input(s): POCBNP  CBG: Recent Labs  Lab 01/21/21 1539 01/21/21 1917 01/21/21 2313 01/22/21 0349 01/22/21 0711  GLUCAP 129* 112* 145* 113* 96    Microbiology: Results for orders placed or performed during the hospital encounter of 01/15/2021  Resp Panel by RT-PCR (Flu A&B, Covid) Nasopharyngeal Swab     Status: None   Collection Time: 01/17/2021  6:20 PM   Specimen: Nasopharyngeal Swab; Nasopharyngeal(NP) swabs in vial transport medium  Result Value Ref Range Status   SARS Coronavirus 2 by RT PCR NEGATIVE NEGATIVE  Final    Comment: (NOTE) SARS-CoV-2 target nucleic acids are NOT DETECTED.  The SARS-CoV-2 RNA is generally detectable in upper respiratory specimens during the acute phase of infection. The lowest concentration of SARS-CoV-2 viral copies this assay can detect is 138 copies/mL. A negative result does not preclude SARS-Cov-2 infection and should not be used as the sole basis for treatment or other patient management decisions. A negative result may occur with  improper specimen collection/handling, submission of specimen other than nasopharyngeal swab, presence of viral mutation(s) within the areas targeted by this assay, and inadequate number of viral copies(<138 copies/mL). A negative result must be combined with clinical observations, patient history, and  epidemiological information. The expected result is Negative.  Fact Sheet for Patients:  EntrepreneurPulse.com.au  Fact Sheet for Healthcare Providers:  IncredibleEmployment.be  This test is no t yet approved or cleared by the [place] FDA and  has been authorized for detection and/or diagnosis of SARS-CoV-2 by FDA under an Emergency Use Authorization (EUA). This EUA will remain  in effect (meaning this test can be used) for the duration of the COVID-19 declaration under Section 564(b)(1) of the Act, 21 U.S.C.section 360bbb-3(b)(1), unless the authorization is terminated  or revoked sooner.       Influenza A by PCR NEGATIVE NEGATIVE Final   Influenza B by PCR NEGATIVE NEGATIVE Final    Comment: (NOTE) The Xpert Xpress SARS-CoV-2/FLU/RSV plus assay is intended as an aid in the diagnosis of influenza from Nasopharyngeal swab specimens and should not be used as a sole basis for treatment. Nasal washings and aspirates are unacceptable for Xpert Xpress SARS-CoV-2/FLU/RSV testing.  Fact Sheet for Patients: EntrepreneurPulse.com.au  Fact Sheet for Healthcare Providers: IncredibleEmployment.be  This test is not yet approved or cleared by the [place] FDA and has been authorized for detection and/or diagnosis of SARS-CoV-2 by FDA under an Emergency Use Authorization (EUA). This EUA will remain in effect (meaning this test can be used) for the duration of the COVID-19 declaration under Section 564(b)(1) of the Act, 21 U.S.C. section 360bbb-3(b)(1), unless the authorization is terminated or revoked.  Performed at [facility], [address] [place], [place] [address]   Blood Culture (routine x 2)     Status: None (Preliminary result)   Collection Time: 02/11/2021  6:23 PM   Specimen: Left Antecubital; Blood  Result Value Ref Range Status   Specimen Description LEFT ANTECUBITAL  Final    Special Requests   Final    BOTTLES DRAWN AEROBIC AND ANAEROBIC Blood Culture results may not be optimal due to an inadequate volume of blood received in culture bottles   Culture   Final    NO GROWTH 2 DAYS Performed at [facility], [address], [place], [place] [address]    Report Status PENDING  Incomplete  MRSA Next Gen by PCR, Nasal     Status: Abnormal   Collection Time: 01/16/2021  9:59 PM   Specimen: Nasal Mucosa; Nasal Swab  Result Value Ref Range Status   MRSA by PCR Next Gen DETECTED (A) NOT DETECTED Final    Comment: RESULT CALLED TO, READ BACK BY AND VERIFIED WITH: '@[id number]'  Melissa Cobb 01/13/2021 (NOTE) The GeneXpert MRSA Assay (FDA approved for NASAL specimens only), is one component of a comprehensive MRSA colonization surveillance program. It is not intended to diagnose MRSA infection nor to guide or monitor treatment for MRSA infections. Test performance is not FDA approved in patients less than 2 years old. Performed at [facility], [address] [place], [place] [address]   Culture, Respiratory w Gram Stain     Status: None (Preliminary result)   Collection Time: 01/21/21  8:35 AM   Specimen: Tracheal Aspirate; Respiratory  Result Value Ref Range Status   Specimen Description   Final    TRACHEAL ASPIRATE Performed at [facility], [address] [place], [place] [address]    Special Requests   Final    NONE Performed at [facility], [address] [place], [place] [address]    Gram Stain   Final    FEW WBC PRESENT, PREDOMINANTLY MONONUCLEAR FEW YEAST WITH PSEUDOHYPHAE RARE GRAM POSITIVE COCCI IN PAIRS Performed at [facility], [place] [address], [place], [place] [address]    Culture PENDING  Incomplete   Report Status PENDING  Incomplete  Culture, blood (Routine X 2) w Reflex to ID Panel     Status: None (Preliminary result)   Collection Time: 01/21/21 11:10 PM   Specimen: BLOOD  Result Value Ref Range  Status   Specimen Description BLOOD LEFT HAND  Final   Special Requests   Final    BOTTLES DRAWN AEROBIC AND ANAEROBIC Blood Culture adequate volume   Culture   Final    NO GROWTH < 12 HOURS Performed at [facility], [address] [place], [place] [address]    Report Status PENDING  Incomplete    Coagulation Studies: Recent Labs    02/08/2021 1820  LABPROT 19.1*  INR 1.6*    Urinalysis: Recent Labs    01/18/2021 1822  COLORURINE AMBER*  LABSPEC 1.020  PHURINE 5.0  GLUCOSEU NEGATIVE  HGBUR NEGATIVE  BILIRUBINUR NEGATIVE  KETONESUR NEGATIVE  PROTEINUR 30*  NITRITE NEGATIVE  LEUKOCYTESUR NEGATIVE      Imaging: DG Abd 1 View  Result Date: 01/21/2021 CLINICAL DATA:  Left femoral line placement EXAM: ABDOMEN - 1 VIEW COMPARISON:  None. FINDINGS: Left femoral line is in place with the tip in the upper pelvis, likely in the left common iliac vein. Gas within large and small bowel. No acute bony abnormality. IMPRESSION: Left central line tip in the upper pelvis, likely in the left common iliac vein. Electronically Signed   By: Rolm Baptise M.D.   On: 01/21/2021 00:17   DG Abd 1 View  Result Date: 02/12/2021 CLINICAL DATA:  OG tube placement EXAM: ABDOMEN - 1 VIEW COMPARISON:  02/09/2021 FINDINGS: Esophageal tube tip now overlies the gastroduodenal region. Mild diffuse gaseous dilatation of bowel potentially due to a mild ileus. Right femoral catheter is looped back upon itself with the tip directed caudal and beyond the image in the region of the right groin. IMPRESSION: 1. Esophageal tube tip overlies the gastroduodenal region 2. Right femoral catheter appears looped back upon itself at the right groin with the tip directed caudal but not included on the image 3. Probable ileus These results will be called to the ordering clinician or representative by the Radiologist Assistant, and communication documented in the PACS or [employer]. Electronically Signed   By: Donavan Foil M.D.   On: 02/01/2021 23:30   DG Abd 1 View  Result Date: 01/28/2021 CLINICAL DATA:  OG tube placement. EXAM: ABDOMEN - 1 VIEW COMPARISON:  None. FINDINGS: The tip of the enteric tube is in the distal esophagus, side-port in the mid esophagus. Advancement of at least 15 cm is recommended to place the side-port below the diaphragm. Multiple overlying monitoring devices. IMPRESSION: Tip of  the enteric tube in the distal esophagus, side-port in the mid esophagus. Advancement of at least 15 cm is recommended to place the side-port below the diaphragm. Electronically Signed   By: Keith Rake M.D.   On: 02/12/2021 19:30   CT Head Wo Contrast  Result Date: 01/22/2021 CLINICAL DATA:  Mental status change, unknown cause EXAM: CT HEAD WITHOUT CONTRAST TECHNIQUE: Contiguous axial images were obtained from the base of the skull through the vertex without intravenous contrast. COMPARISON:  Head CT 10/15/2018 FINDINGS: Brain: Brain volume is normal for age. No intracranial hemorrhage, mass effect, or midline shift. Gray-white differentiation is preserved. No hydrocephalus. The basilar cisterns are patent. No evidence of territorial infarct or acute ischemia. No extra-axial or intracranial fluid collection. Vascular: No hyperdense vessel or unexpected calcification. Skull: No fracture or focal lesion. Sinuses/Orbits: Mild mucosal thickening of right side of sphenoid sinus. Remaining paranasal sinuses are clear. No acute orbital findings. Mastoid air cells are clear. Other: None. IMPRESSION: No acute intracranial abnormality. Electronically Signed   By: Keith Rake M.D.   On: 02/02/2021 20:22   DG Chest Port 1 View  Result Date: 01/21/2021 CLINICAL DATA:  Central line placement. EXAM: PORTABLE CHEST 1 VIEW COMPARISON:  January 20, 2021. FINDINGS: Stable cardiomediastinal silhouette. Endotracheal and nasogastric tubes are in good position. Right internal jugular catheter is noted with distal tip in  expected position of SVC. No pneumothorax or pleural effusion is noted. Able bilateral lung opacities are noted concerning for edema or multifocal pneumonia. Bony thorax is unremarkable. IMPRESSION: Interval placement of right internal jugular catheter with distal tip in expected position of SVC. No pneumothorax is noted. Stable support apparatus and bilateral lung opacities as described above. Electronically Signed   By: Marijo Conception M.D.   On: 01/21/2021 11:05   DG Chest Port 1 View  Result Date: 01/30/2021 CLINICAL DATA:  Questionable sepsis EXAM: PORTABLE CHEST 1 VIEW COMPARISON:  11/06/2010 FINDINGS: Endotracheal tube is 4 cm above the carina. NG tube is in the distal esophagus. Cardiomegaly. Bilateral airspace disease, favor edema. No effusions or acute bony abnormality. IMPRESSION: Diffuse bilateral airspace disease, favor edema/CHF. Endotracheal tube in expected position. NG tube tip in the distal esophagus. Electronically Signed   By: Rolm Baptise M.D.   On: 01/14/2021 19:28   CT CHEST ABDOMEN PELVIS WO CONTRAST  Result Date: 01/14/2021 CLINICAL DATA:  Sepsis. EXAM: CT CHEST, ABDOMEN AND PELVIS WITHOUT CONTRAST TECHNIQUE: Multidetector CT imaging of the chest, abdomen and pelvis was performed following the standard protocol without IV contrast. COMPARISON:  Chest and abdominal x-rays from same day. Abdominal ultrasound dated February 11, 2019. CT abdomen and pelvis dated September 18, 2006. FINDINGS: CT CHEST FINDINGS Cardiovascular: No significant vascular findings. Mild cardiomegaly. No pericardial effusion. No thoracic aortic aneurysm. Coronary, aortic arch, and branch vessel atherosclerotic vascular disease. Mediastinum/Nodes: No enlarged mediastinal, hilar, or axillary lymph nodes. Thyroid gland, trachea, and esophagus demonstrate no significant findings. Lungs/Pleura: Endotracheal tube tip 2.6 cm above the carina. Moderate upper lobe predominant centrilobular emphysema. Patchy ground-glass  densities and consolidation throughout both lungs. Trace bilateral pleural effusions. No pneumothorax. Musculoskeletal: No acute or significant osseous findings. CT ABDOMEN PELVIS FINDINGS Hepatobiliary: No focal liver abnormality. Distended gallbladder without radiopaque gallstones, wall thickening, or biliary dilatation. Pancreas: Mild peripancreatic inflammatory change without fluid collection. No ductal dilatation. Spleen: Normal in size without focal abnormality. Adrenals/Urinary Tract: The adrenal glands are unremarkable. Punctate bilateral renal calculi. No hydronephrosis. The bladder is decompressed by Foley catheter. Stomach/Bowel: Enteric  tube in the stomach. The stomach is within normal limits. No bowel wall thickening, distention, or surrounding inflammatory changes. Normal appendix with appendicoliths. Vascular/Lymphatic: Aortic atherosclerosis. No enlarged abdominal or pelvic lymph nodes. Reproductive: Status post hysterectomy. No adnexal masses. Other: No abdominal wall hernia or abnormality. No abdominopelvic ascites. No pneumoperitoneum. Musculoskeletal: No acute or significant osseous findings. IMPRESSION: Chest: 1. Patchy ground-glass densities and consolidation throughout both lungs, concerning for multifocal pneumonia. Alternatively, this could reflect ARDS in the setting of pancreatitis. 2. Trace bilateral pleural effusions. 3. Aortic Atherosclerosis (ICD10-I70.0) and Emphysema (ICD10-J43.9). Abdomen and pelvis: 1. Acute pancreatitis.  No peripancreatic fluid collection. 2. Bilateral nonobstructive nephrolithiasis. Electronically Signed   By: Titus Dubin M.D.   On: 01/26/2021 20:37     Medications:     prismasol BGK 4/2.5 500 mL/hr at 01/21/21 2320    prismasol BGK 4/2.5 500 mL/hr at 01/21/21 2328   sodium chloride Stopped (01/21/21 0537)   famotidine (PEPCID) IV Stopped (01/30/2021 2338)   fentaNYL infusion INTRAVENOUS 50 mcg/hr (01/22/21 0700)   heparin 1,100 Units/hr (01/22/21  0700)   midazolam 2 mg/hr (01/22/21 0700)   norepinephrine (LEVOPHED) Adult infusion 6 mcg/min (01/22/21 0700)   phenylephrine (NEO-SYNEPHRINE) Adult infusion Stopped (01/21/21 1221)   piperacillin-tazobactam Stopped (01/22/21 0603)   prismasol BGK 4/2.5 2,000 mL/hr at 01/22/21 5284    sodium bicarbonate (isotonic) infusion in sterile water 75 mL/hr at 01/22/21 0700   vancomycin Stopped (01/21/21 2302)   vasopressin 0.03 Units/min (01/22/21 0700)    budesonide (PULMICORT) nebulizer solution  0.25 mg Nebulization BID   chlorhexidine gluconate (MEDLINE KIT)  15 mL Mouth Rinse BID   Chlorhexidine Gluconate Cloth  6 each Topical Q0600   docusate  100 mg Per Tube BID   insulin aspart  0-15 Units Subcutaneous Q4H   ipratropium-albuterol  3 mL Nebulization Q6H   lactulose  10 g Per Tube BID   mouth rinse  15 mL Mouth Rinse 10 times per day   mupirocin ointment  1 application Nasal BID   polyethylene glycol  17 g Per Tube Daily   vancomycin variable dose per unstable renal function (pharmacist dosing)   Does not apply See admin instructions   docusate sodium, fentaNYL, heparin sodium (porcine), midazolam, polyethylene glycol  Assessment/ Plan:  61 y.o. female with a PMHx of hypertension, COPD, tobacco abuse, diabetes mellitus type 2, bipolar disorder, depression, who was admitted to [facility] on 02/03/2021 for evaluation of bradycardia and unresponsiveness.  Patient found to have pancreatitis, acute respiratory failure and multiorgan failure.  1.  Acute kidney injury secondary to acute tubular necrosis.  Patient continues to have oliguria at this time.  Has been started on CRRT.  We will continue CRRT as the patient remains critically ill.  At 50 cc of ultrafiltration per hour.  2.  Acute respiratory failure.  FiO2 down to 60%.  Weaning as per pulmonary critical care.  3.  Metabolic acidosis.  Serum bicarbonate now improved to 25.  Continue CRRT.  4.  Hypotension.  Patient maintained on Levophed  drip.  Maintain on Levophed to achieve MAP of 65 or greater.   LOS: 2 Tal Neer 1/10/20237:54 AM

## 2021-01-22 NOTE — Progress Notes (Signed)
Chaplain Maggie met pt's son and daughter in law in the ICU waiting room. Social support, storytelling and empathetic listening were central to this visit. Continued support available per on call chaplain.

## 2021-01-22 NOTE — Progress Notes (Signed)
PHARMACY CONSULT NOTE - FOLLOW UP  Pharmacy Consult for Electrolyte Monitoring and Replacement   Recent Labs: Potassium (mmol/L)  Date Value  01/22/2021 4.0  11/04/2011 4.0   Magnesium (mg/dL)  Date Value  01/22/2021 2.2   Calcium (mg/dL)  Date Value  01/22/2021 6.8 (L)   Calcium, Total (mg/dL)  Date Value  11/04/2011 8.7   Albumin (g/dL)  Date Value  01/22/2021 2.7 (L)  10/15/2020 4.8   Phosphorus (mg/dL)  Date Value  01/22/2021 3.7   Sodium (mmol/L)  Date Value  01/22/2021 134 (L)  11/04/2011 141     Assessment: 61yo female admitted 02/08/2021 with respiratory failure, multifactorial. Patient is intubated in the ICU. Currently on CRRT. Pharmacy consulted for electrolyte replacement.   Goal of Therapy:  Electrolytes WNL  Plan:  --No replacement indicated at this time --Follow up electrolytes this afternoon --Expect patient to likely need phosphorus replacement given ongoing CRRT  Tawnya Crook, PharmD, BCPS Clinical Pharmacist 01/22/2021 12:32 PM

## 2021-01-22 NOTE — TOC Initial Note (Signed)
Transition of Care [facility]) - Initial/Assessment Note    Patient Details  Name: Victoria Frey MRN: [id number] Date of Birth: 10-03-1960  Transition of Care [facility]) CM/SW Contact:    Kerin Salen, RN Phone Number: 01/22/2021, 4:14 PM  Clinical Narrative:  Unable to do TOC assessment, patient NMS. TOC to continue to track and assist as needed.                       Patient Goals and CMS Choice        Expected Discharge Plan and Services                                                Prior Living Arrangements/Services                       Activities of Daily Living      Permission Sought/Granted                  Emotional Assessment              Admission diagnosis:  Encounter for orogastric (OG) tube placement [Z46.59] Unresponsiveness [R41.89] Septic shock ([place]) [A41.9, R65.21] Acute renal failure, unspecified acute renal failure type ([place]) [N17.9] Pneumonia of both lungs due to infectious organism, unspecified part of lung [J18.9] Patient Active Problem List   Diagnosis Date Noted   Unresponsiveness 01/19/2021   Esophageal dysphagia    Schatzki's ring    Special screening for malignant neoplasms, colon    Microscopic colitis    Polyp of hepatic flexure of colon    Bipolar disorder, now depressed ([place]) 08/30/2014   Cocaine abuse ([place]) 08/30/2014   PCP:  Denton Lank, MD Pharmacy:   [place], [place] [address] Phone: [phone] Fax: [phone]     Social Determinants of Health (SDOH) Interventions    Readmission Risk Interventions No flowsheet data found.

## 2021-01-22 NOTE — Progress Notes (Signed)
ANTICOAGULATION CONSULT NOTE  Pharmacy Consult for heparin Indication:  CRRT clotting issues  No Known Allergies  Patient Measurements: Height: 5\' 7"  (170.2 cm) Weight: 93 kg (205 lb 0.4 oz) IBW/kg (Calculated) : 61.6 Heparin Dosing Weight: 81 kg  Vital Signs: Temp: 97.9 F (36.6 C) (01/10 1100) Temp Source: Bladder (01/10 1100) BP: 127/50 (01/10 1100) Pulse Rate: 80 (01/10 1100)  Labs: Recent Labs    02/07/2021 1820 01/21/2021 2122 01/21/21 0435 01/21/21 0456 01/21/21 1545 01/21/21 2250 01/22/21 0503  HGB 10.6*  --  11.2*  --   --   --  9.7*  HCT 33.3*  --  33.1*  --   --   --  28.0*  PLT 251  --  265  --   --   --  134*  APTT 41*  --   --   --   --   --   --   LABPROT 19.1*  --   --   --   --   --   --   INR 1.6*  --   --   --   --   --   --   HEPARINUNFRC  --   --   --   --   --  0.18* 0.23*  CREATININE 5.44*  --   --    < > 4.75* 3.16* 2.60*  TROPONINIHS 14 14  --   --   --   --   --    < > = values in this interval not displayed.     Estimated Creatinine Clearance: 26.6 mL/min (A) (by C-G formula based on SCr of 2.6 mg/dL (H)).   Medical History: Past Medical History:  Diagnosis Date   Bipolar 1 disorder ([place])    Chronic pain    COPD (chronic obstructive pulmonary disease) ([facility])    Depression    Diabetes mellitus without complication ([place])    Drug abuse ([place])    Hypertension       Assessment: 61 year old female presented with respiratory failure, multifactorial requiring intubation. Creatinine elevated on admission, patient to start on CRRT. Pharmacy consulted for heparin management for CRRT clotting issues.  Goal of Therapy:  Heparin level 0.3-0.5 units/ml or lower if no clotting issues reported Monitor platelets by anticoagulation protocol: Yes    Plan:  --HL "subtherapeutic" but no reported issues with CRRT circuit --Continue heparin infusion at 1100 units/hr --Recheck HL with morning labs --CBC daily while on heparin  Tawnya Crook,  PharmD, BCPS Clinical Pharmacist 01/22/2021 12:29 PM

## 2021-01-22 NOTE — Progress Notes (Addendum)
CRRT running without issues this shift. Sedation increased due to vent dys- synchrony. NaHCO3 infusion decreased to 40 cc/hour as ABG result.Vasopressin at 0.03 units/hour, NorEpi at 5 mcg/min. Heparin @ 1100 units/hour.

## 2021-01-22 NOTE — Progress Notes (Signed)
NAME:  Victoria Frey, MRN:  [id number], DOB:  25-Apr-1960, LOS: 2 ADMISSION DATE:  02/06/2021  History of Present Illness:  61 yo F with LKW Saturday 01/19/21, when patient complained to a family member she didn't feel well and was having trouble going to the bathroom. EMS called by family 02/07/2021 due to confusion & slurred speech. Per ED report on EMS arrival patient appeared confused and stumbling. Initially, patient was able to walk with assistance to the ambulance, but during transport she became unconscious, bradycardic in the 30's and hypotensive. Patient TC paced and intubated in the field, pt received 500 mL bolus. ED course: Workup completed per Sepsis protocol. Medications given: 3 L NS bolus, Levophed drip started, cefepime/flagyl/vancomycin Initial Vitals: hypothermic 83.5, RR- 14, initially hypertensive 237/73 then subsequently hypotensive 46/32, SpO2 99% on 60 % FiO2 with mechanical ventilatory support Significant labs: (Labs/ Imaging personally reviewed) I, Domingo Pulse [place], AGACNP-BC, personally viewed and interpreted this ECG. Chemistry: mild hyponatremia Na+:133, K+: 3.9, BUN/Cr.: 75/ 5.44, Serum CO2/ AG: 11/18, Alk phos: 72, albumin: 2.7, lipase: 86, AST/ALT: 84/41 Hematology: leukocytosis WBC: 13.6, Hgb: 10.6,  Lactic: 4.1, COVID-19 & Influenza A/B: negative ABG: 6.94/ 46/ 116/ 9.9 CXR 02/01/2021: Diffuse bilateral airspace disease, favor edema/CHF CT chest wo contrast 01/13/2021: Patchy ground-glass densities and consolidation throughout both lungs, concerning for multifocal pneumonia. Alternatively, this could reflect ARDS in the setting of pancreatitis. Trace bilateral pleural effusions in the setting of emphysema.  CT Abdomen and pelvis wo contrast 01/23/2021: Acute pancreatitis.  No peripancreatic fluid collection. Bilateral nonobstructive nephrolithiasis. Tiro wo contrast 02/10/2021: no acute intracranial abnormality   PCCM consulted for admission due to acute respiratory failure  and multi-organ failure requiring vasopressors and mechanical ventilatory support.   Pertinent  Medical History  Bipolar disorder 1 & depression HTN COPD not on chronic O2 Current smoker T2DM Polysubstance abuse Significant Hospital Events: Including procedures, antibiotic start and stop dates in addition to other pertinent events   02/05/2021: Admit to ICU with acute respiratory failure requiring mechanical ventilatory support in the setting of acute pancreatitis & circulatory shock on vasopressor support 1/9 severe ARDS, VASC cath placed, CRRT started 1/10 remains critically ill   Interim History / Subjective:  SEVERE +multiorgan failure SEVERE  ARDS Progressive renal failure +shock   Micro Data:  COVID and FLU NEG  Antimicrobials:   Antibiotics Given (last 72 hours)     Date/Time Action Medication Dose Rate   02/10/2021 1852 New Bag/Given   metroNIDAZOLE (FLAGYL) IVPB 500 mg 500 mg 100 mL/hr   01/21/2021 1855 New Bag/Given   ceFEPIme (MAXIPIME) 2 g in sodium chloride 0.9 % 100 mL IVPB 2 g 200 mL/hr   02/10/2021 2107 New Bag/Given   vancomycin (VANCOCIN) IVPB 1000 mg/200 mL premix 1,000 mg 200 mL/hr   01/21/21 0118 New Bag/Given   piperacillin-tazobactam (ZOSYN) IVPB 2.25 g 2.25 g 100 mL/hr   01/21/21 0127 New Bag/Given   vancomycin (VANCOCIN) IVPB 1000 mg/200 mL premix 1,000 mg 200 mL/hr   01/21/21 0740 New Bag/Given   piperacillin-tazobactam (ZOSYN) IVPB 2.25 g 2.25 g 100 mL/hr   01/21/21 1540 New Bag/Given   piperacillin-tazobactam (ZOSYN) IVPB 3.375 g 3.375 g 100 mL/hr   01/21/21 2202 New Bag/Given   vancomycin (VANCOCIN) IVPB 1000 mg/200 mL premix 1,000 mg 200 mL/hr   01/21/21 2343 New Bag/Given   piperacillin-tazobactam (ZOSYN) IVPB 3.375 g 3.375 g 100 mL/hr   01/22/21 0533 New Bag/Given   piperacillin-tazobactam (ZOSYN) IVPB 3.375 g 3.375 g 100  mL/hr          Objective   Blood pressure (!) 103/43, pulse 78, temperature 98.1 F (36.7 C), resp. rate 16, height  '5\' 7"'  (1.702 m), weight 93 kg, SpO2 100 %.    Vent Mode: PRVC FiO2 (%):  [60 %-100 %] 60 % Set Rate:  [24 bmp] 24 bmp Vt Set:  [30 mL-5003 mL] 500 mL PEEP:  [8 cmH20] 8 cmH20 Plateau Pressure:  [17 cmH20-23 cmH20] 23 cmH20   Intake/Output Summary (Last 24 hours) at 01/22/2021 0750 Last data filed at 01/22/2021 0700 Gross per 24 hour  Intake 5361.38 ml  Output 2918 ml  Net 2443.38 ml    Filed Weights   01/30/2021 1813 02/09/2021 2156  Weight: 90 kg 93 kg     REVIEW OF SYSTEMS  PATIENT IS UNABLE TO PROVIDE COMPLETE REVIEW OF SYSTEMS DUE TO SEVERE CRITICAL ILLNESS AND TOXIC METABOLIC ENCEPHALOPATHY   PHYSICAL EXAMINATION:  GENERAL:critically ill appearing, +resp distress EYES: Pupils equal, round, reactive to light.  No scleral icterus.  MOUTH: Moist mucosal membrane. INTUBATED NECK: Supple.  PULMONARY: +rhonchi, +wheezing CARDIOVASCULAR: S1 and S2.  No murmurs  GASTROINTESTINAL: Soft, nontender, -distended. Positive bowel sounds.  MUSCULOSKELETAL: No swelling, clubbing, or edema.  NEUROLOGIC: obtunded SKIN:intact,warm,dry    Labs/imaging that I havepersonally reviewed  (right click and "Reselect all SmartList Selections" daily)       ASSESSMENT AND PLAN SYNOPSIS  Severe ACUTE Hypoxic and Hypercapnic Respiratory Failure with septic shock on admission with severe pancreatitis and ARDS with progressive    Severe ACUTE Hypoxic and Hypercapnic Respiratory Failure -continue Mechanical Ventilator support -Wean Fio2 and PEEP as tolerated -VAP/VENT bundle implementation - Wean PEEP & FiO2 as tolerated, maintain SpO2 > 88% - Head of bed elevated 30 degrees, VAP protocol in place - Plateau pressures less than 30 cm H20  - Intermittent chest x-ray & ABG PRN - Ensure adequate pulmonary hygiene  -will NOT  perform SAT/SBT when respiratory parameters are met   Vent Mode: PRVC FiO2 (%):  [60 %-100 %] 60 % Set Rate:  [24 bmp] 24 bmp Vt Set:  [30 mL-5003 mL] 500 mL PEEP:   [8 cmH20] 8 cmH20 Plateau Pressure:  [17 cmH20-23 cmH20] 23 cmH20   CARDIAC ICU monitoring  ACUTE KIDNEY INJURY/Renal Failure -continue Foley Catheter-assess need -Avoid nephrotoxic agents -Follow urine output, BMP -Ensure adequate renal perfusion, optimize oxygenation -Renal dose medications Will likely need CRRT Continue bicarb infusion for severe acidosis  Intake/Output Summary (Last 24 hours) at 01/22/2021 0752 Last data filed at 01/22/2021 0700 Gross per 24 hour  Intake 5361.38 ml  Output 2918 ml  Net 2443.38 ml    NEUROLOGY ACUTE TOXIC METABOLIC ENCEPHALOPATHY -need for sedation -Goal RASS -2 to -3   SEPTIC SHOCK SOURCE-pancreas,pneumonia -use vasopressors to keep MAP>65 as needed -follow ABG and LA -follow up cultures -emperic ABX   ENDO - ICU hypoglycemic\Hyperglycemia protocol -check FSBS per protocol   GI GI PROPHYLAXIS as indicated  NUTRITIONAL STATUS DIET-->TF's as tolerated Constipation protocol as indicated   ELECTROLYTES -follow labs as needed -replace as needed -pharmacy consultation and following   Best Practice (right click and "Reselect all SmartList Selections" daily)  Diet/type: NPO w/ meds via tube DVT prophylaxis: prophylactic heparin  GI prophylaxis: H2B Lines: N/A, will be placing arterial & CVC Foley:  Yes, and it is still needed Code Status:  full code Last date of multidisciplinary goals of care discussion [01/31/2021]    Labs   CBC: Recent Labs  Lab 01/29/2021 1820 01/21/21 0435 01/22/21 0503  WBC 13.6* 3.8* 5.1  NEUTROABS 11.6*  --   --   HGB 10.6* 11.2* 9.7*  HCT 33.3* 33.1* 28.0*  MCV 92.2 89.9 86.2  PLT 251 265 134*     Basic Metabolic Panel: Recent Labs  Lab 02/04/2021 1820 01/21/21 0456 01/21/21 1545 01/21/21 2250 01/22/21 0503  NA 133* 132* 131* 133* 134*  K 3.9 4.3 4.1 3.7 4.0  CL 104 100 96* 97* 99  CO2 11* 18* 21* 25 25  GLUCOSE 159* 188* 125* 145* 104*  BUN 75* 77* 73* 50* 36*   CREATININE 5.44* 4.80* 4.75* 3.16* 2.60*  CALCIUM 6.5* 6.1* 5.7* 6.4* 6.8*  MG  --  1.9  --   --  2.2  PHOS  --  10.0* 9.7*  --  3.7    GFR: Estimated Creatinine Clearance: 26.6 mL/min (A) (by C-G formula based on SCr of 2.6 mg/dL (H)). Recent Labs  Lab 01/23/2021 1820 02/04/2021 2308 01/21/21 0435 01/21/21 0456 01/22/21 0503  PROCALCITON  --   --   --  4.15 4.18  WBC 13.6*  --  3.8*  --  5.1  LATICACIDVEN 4.1* 1.7  --   --   --      Liver Function Tests: Recent Labs  Lab 01/19/2021 1820 01/21/21 0456 01/21/21 1545 01/22/21 0503  AST 84* 166*  --   --   ALT 41 69*  --   --   ALKPHOS 72 80  --   --   BILITOT 0.7 1.1  --   --   PROT 5.5* 5.8*  --   --   ALBUMIN 2.7* 2.9* 2.9* 2.7*    Recent Labs  Lab 02/08/2021 1820 01/30/2021 2308 01/21/21 0456  LIPASE 86*  --  54*  AMYLASE  --  267*  --     Recent Labs  Lab 01/21/21 0456  AMMONIA 42*     ABG    Component Value Date/Time   PHART 7.21 (L) 01/21/2021 1930   PCO2ART 56 (H) 01/21/2021 1930   PO2ART 108 01/21/2021 1930   HCO3 22.4 01/21/2021 1930   ACIDBASEDEF 5.8 (H) 01/21/2021 1930   O2SAT 97.0 01/21/2021 1930      Coagulation Profile: Recent Labs  Lab 02/12/2021 1820  INR 1.6*     Cardiac Enzymes: No results for input(s): CKTOTAL, CKMB, CKMBINDEX, TROPONINI in the last 168 hours.  HbA1C: Hgb A1c MFr Bld  Date/Time Value Ref Range Status  02/05/2021 09:22 PM 5.9 (H) 4.8 - 5.6 % Final    Comment:    (NOTE) Pre diabetes:          5.7%-6.4%  Diabetes:              >6.4%  Glycemic control for   <7.0% adults with diabetes     CBG: Recent Labs  Lab 01/21/21 1539 01/21/21 1917 01/21/21 2313 01/22/21 0349 01/22/21 0711  GLUCAP 129* 112* 145* 113* 96     Allergies No Known Allergies      DVT/GI PRX  assessed I Assessed the need for Labs I Assessed the need for Foley I Assessed the need for Central Venous Line Family Discussion when available I Assessed the need for  Mobilization I made an Assessment of medications to be adjusted accordingly Safety Risk assessment completed  CASE DISCUSSED IN MULTIDISCIPLINARY ROUNDS WITH ICU TEAM     Critical Care Time devoted to patient care services described in this note is 50 minutes.  Critical care was necessary to treat /prevent imminent and life-threatening deterioration. Overall, patient is critically ill, prognosis is guarded.  Patient with Multiorgan failure and at high risk for cardiac arrest and death.    Corrin Parker, M.D.  [facility]  Medical Director [place] Director [facility] Cardio-Pulmonary Department

## 2021-01-23 ENCOUNTER — Inpatient Hospital Stay: Payer: Medicare Other

## 2021-01-23 DIAGNOSIS — R4189 Other symptoms and signs involving cognitive functions and awareness: Secondary | ICD-10-CM | POA: Diagnosis not present

## 2021-01-23 LAB — CBC
HCT: 27.4 % — ABNORMAL LOW (ref 36.0–46.0)
Hemoglobin: 9.2 g/dL — ABNORMAL LOW (ref 12.0–15.0)
MCH: 29.9 pg (ref 26.0–34.0)
MCHC: 33.6 g/dL (ref 30.0–36.0)
MCV: 89 fL (ref 80.0–100.0)
Platelets: 121 10*3/uL — ABNORMAL LOW (ref 150–400)
RBC: 3.08 MIL/uL — ABNORMAL LOW (ref 3.87–5.11)
RDW: 15.6 % — ABNORMAL HIGH (ref 11.5–15.5)
WBC: 7.3 10*3/uL (ref 4.0–10.5)
nRBC: 0.3 % — ABNORMAL HIGH (ref 0.0–0.2)

## 2021-01-23 LAB — GLUCOSE, CAPILLARY
Glucose-Capillary: 118 mg/dL — ABNORMAL HIGH (ref 70–99)
Glucose-Capillary: 122 mg/dL — ABNORMAL HIGH (ref 70–99)
Glucose-Capillary: 132 mg/dL — ABNORMAL HIGH (ref 70–99)
Glucose-Capillary: 134 mg/dL — ABNORMAL HIGH (ref 70–99)
Glucose-Capillary: 174 mg/dL — ABNORMAL HIGH (ref 70–99)
Glucose-Capillary: 97 mg/dL (ref 70–99)

## 2021-01-23 LAB — RENAL FUNCTION PANEL
Albumin: 2.4 g/dL — ABNORMAL LOW (ref 3.5–5.0)
Albumin: 2.5 g/dL — ABNORMAL LOW (ref 3.5–5.0)
Anion gap: 6 (ref 5–15)
Anion gap: 7 (ref 5–15)
BUN: 18 mg/dL (ref 8–23)
BUN: 17 mg/dL (ref 8–23)
CO2: 29 mmol/L (ref 22–32)
CO2: 29 mmol/L (ref 22–32)
Calcium: 7.4 mg/dL — ABNORMAL LOW (ref 8.9–10.3)
Calcium: 7.7 mg/dL — ABNORMAL LOW (ref 8.9–10.3)
Chloride: 100 mmol/L (ref 98–111)
Chloride: 101 mmol/L (ref 98–111)
Creatinine, Ser: 1.51 mg/dL — ABNORMAL HIGH (ref 0.44–1.00)
Creatinine, Ser: 1.48 mg/dL — ABNORMAL HIGH (ref 0.44–1.00)
GFR, Estimated: 39 mL/min — ABNORMAL LOW (ref >60)
GFR, Estimated: 40 mL/min — ABNORMAL LOW (ref >60)
Glucose, Bld: 130 mg/dL — ABNORMAL HIGH (ref 70–99)
Glucose, Bld: 128 mg/dL — ABNORMAL HIGH (ref 70–99)
Phosphorus: 2.4 mg/dL — ABNORMAL LOW (ref 2.5–4.6)
Phosphorus: 2.9 mg/dL (ref 2.5–4.6)
Potassium: 4.3 mmol/L (ref 3.5–5.1)
Potassium: 4.3 mmol/L (ref 3.5–5.1)
Sodium: 135 mmol/L (ref 135–145)
Sodium: 137 mmol/L (ref 135–145)

## 2021-01-23 LAB — BLOOD GAS, ARTERIAL
Acid-Base Excess: 4.4 mmol/L — ABNORMAL HIGH (ref 0.0–2.0)
Bicarbonate: 30.9 mmol/L — ABNORMAL HIGH (ref 20.0–28.0)
FIO2: 0.6
MECHVT: 500 mL
Mechanical Rate: 24
O2 Saturation: 99 %
PEEP: 8 cmH2O
Patient temperature: 37
pCO2 arterial: 56 mmHg — ABNORMAL HIGH (ref 32.0–48.0)
pH, Arterial: 7.35 (ref 7.350–7.450)
pO2, Arterial: 139 mmHg — ABNORMAL HIGH (ref 83.0–108.0)

## 2021-01-23 LAB — VANCOMYCIN, TROUGH: Vancomycin Tr: 11 ug/mL — ABNORMAL LOW (ref 15–20)

## 2021-01-23 LAB — MAGNESIUM: Magnesium: 2.6 mg/dL — ABNORMAL HIGH (ref 1.7–2.4)

## 2021-01-23 LAB — HEPARIN LEVEL (UNFRACTIONATED): Heparin Unfractionated: 0.1 IU/mL — ABNORMAL LOW (ref 0.30–0.70)

## 2021-01-23 LAB — PROCALCITONIN: Procalcitonin: 2.9 ng/mL

## 2021-01-23 MED ORDER — DOCUSATE SODIUM 50 MG/5ML PO LIQD: 100.0000 mg | Freq: Two times a day (BID) | ORAL | Status: DC | PRN

## 2021-01-23 MED ORDER — METHYLPREDNISOLONE SODIUM SUCC 40 MG IJ SOLR
40.0000 mg | Freq: Two times a day (BID) | INTRAMUSCULAR | Status: DC
  Administered 2021-01-23 – 2021-01-27 (×9): 40 mg via INTRAVENOUS
  Filled 2021-01-23 (×9): qty 1

## 2021-01-23 MED ORDER — POLYETHYLENE GLYCOL 3350 17 G PO PACK: 17.0000 g | PACK | Freq: Every day | ORAL | Status: DC | PRN

## 2021-01-23 MED ORDER — BUDESONIDE 0.5 MG/2ML IN SUSP
0.5000 mg | Freq: Two times a day (BID) | RESPIRATORY_TRACT | Status: DC
  Administered 2021-01-23 – 2021-01-27 (×8): 0.5 mg via RESPIRATORY_TRACT
  Filled 2021-01-23 (×8): qty 2

## 2021-01-23 MED ORDER — NOREPINEPHRINE 16 MG/250ML-% IV SOLN
0.0000 ug/min | INTRAVENOUS | Status: DC
  Administered 2021-01-23 – 2021-01-27 (×2): 2 ug/min via INTRAVENOUS
  Filled 2021-01-23: qty 250

## 2021-01-23 MED ORDER — VECURONIUM BROMIDE 10 MG IV SOLR
10.0000 mg | INTRAVENOUS | Status: DC | PRN
  Administered 2021-01-23 – 2021-01-26 (×10): 10 mg via INTRAVENOUS
  Filled 2021-01-23 (×10): qty 10

## 2021-01-23 MED ORDER — VITAL HIGH PROTEIN PO LIQD
1000.0000 mL | ORAL | Status: AC
  Administered 2021-01-23 – 2021-01-25 (×3): 1000 mL

## 2021-01-23 MED ORDER — SODIUM PHOSPHATES 45 MMOLE/15ML IV SOLN
20.0000 mmol | Freq: Once | INTRAVENOUS | Status: AC
  Administered 2021-01-23: 20 mmol via INTRAVENOUS
  Filled 2021-01-23: qty 6.67

## 2021-01-23 MED ORDER — VANCOMYCIN HCL 1250 MG/250ML IV SOLN
1250.0000 mg | INTRAVENOUS | Status: AC
  Administered 2021-01-23 – 2021-01-26 (×4): 1250 mg via INTRAVENOUS
  Filled 2021-01-23 (×5): qty 250

## 2021-01-23 NOTE — Progress Notes (Signed)
Pharmacy Antibiotic Note  Victoria Frey is a 61 y.o. female admitted on 01/19/2021 with sepsis.  Pharmacy has been consulted for vancomycin dosing. Patient with AKI started on CRRT. Tracheal aspirate with staph aureus, pending sensitivities. MRSA PCR positive.  Plan: --Vanc trough @ 2025: 11.  --Will increase vancomycin to 1250 mg IV q24h (for CRRT)  --Expect patient to likely be on vancomycin for approximately 7 days  Height: 5' 7.01" (170.2 cm) Weight: 94.5 kg (208 lb 5.4 oz) IBW/kg (Calculated) : 61.62  Temp (24hrs), Avg:97.9 F (36.6 C), Min:96.6 F (35.9 C), Max:98.8 F (37.1 C)  Recent Labs  Lab 01/25/2021 1820 01/16/2021 2308 01/21/21 0435 01/21/21 0456 01/21/21 2250 01/22/21 0503 01/22/21 1600 01/23/21 0458 01/23/21 1551 01/23/21 2025  WBC 13.6*  --  3.8*  --   --  5.1  --  7.3  --   --   CREATININE 5.44*  --   --    < > 3.16* 2.60* 1.87* 1.51* 1.48*  --   LATICACIDVEN 4.1* 1.7  --   --   --   --   --   --   --   --   VANCOTROUGH  --   --   --   --   --   --   --   --   --  11*   < > = values in this interval not displayed.     Estimated Creatinine Clearance: 47.1 mL/min (A) (by C-G formula based on SCr of 1.48 mg/dL (H)).    No Known Allergies  Antimicrobials this admission: Vancomycin 1/8 >>  Zosyn 1/8 >> 1/11  Microbiology results: 1/9 Resp Cx: staph aureus, sensitivities pending 1/8 BCx: NG 1/8 UCx: NG 1/8 MRSA PCR: positive  Thank you for allowing pharmacy to be a part of this patients care.  Pernell Dupre, PharmD, BCPS Clinical Pharmacist 01/23/2021 9:22 PM

## 2021-01-23 NOTE — Progress Notes (Signed)
NAME:  Victoria Frey, MRN:  [id number], DOB:  1960/05/06, LOS: 3 ADMISSION DATE:  02/06/2021  History of Present Illness:  61 yo F with LKW Saturday 01/19/21, when patient complained to a family member she didn't feel well and was having trouble going to the bathroom. EMS called by family 02/04/2021 due to confusion & slurred speech. Per ED report on EMS arrival patient appeared confused and stumbling. Initially, patient was able to walk with assistance to the ambulance, but during transport she became unconscious, bradycardic in the 30's and hypotensive. Patient TC paced and intubated in the field, pt received 500 mL bolus. ED course: Workup completed per Sepsis protocol. Medications given: 3 L NS bolus, Levophed drip started, cefepime/flagyl/vancomycin Initial Vitals: hypothermic 83.5, RR- 14, initially hypertensive 237/73 then subsequently hypotensive 46/32, SpO2 99% on 60 % FiO2 with mechanical ventilatory support Significant labs: (Labs/ Imaging personally reviewed) I, Domingo Pulse [place], AGACNP-BC, personally viewed and interpreted this ECG. Chemistry: mild hyponatremia Na+:133, K+: 3.9, BUN/Cr.: 75/ 5.44, Serum CO2/ AG: 11/18, Alk phos: 72, albumin: 2.7, lipase: 86, AST/ALT: 84/41 Hematology: leukocytosis WBC: 13.6, Hgb: 10.6,  Lactic: 4.1, COVID-19 & Influenza A/B: negative ABG: 6.94/ 46/ 116/ 9.9 CXR 01/15/2021: Diffuse bilateral airspace disease, favor edema/CHF CT chest wo contrast 02/08/2021: Patchy ground-glass densities and consolidation throughout both lungs, concerning for multifocal pneumonia. Alternatively, this could reflect ARDS in the setting of pancreatitis. Trace bilateral pleural effusions in the setting of emphysema.  CT Abdomen and pelvis wo contrast 01/13/2021: Acute pancreatitis.  No peripancreatic fluid collection. Bilateral nonobstructive nephrolithiasis. [place] wo contrast 01/26/2021: no acute intracranial abnormality   PCCM consulted for admission due to acute respiratory failure  and multi-organ failure requiring vasopressors and mechanical ventilatory support.   Pertinent  Medical History  Bipolar disorder 1 & depression HTN COPD not on chronic O2 Current smoker T2DM Polysubstance abuse Significant Hospital Events: Including procedures, antibiotic start and stop dates in addition to other pertinent events   01/21/2021: Admit to ICU with acute respiratory failure requiring mechanical ventilatory support in the setting of acute pancreatitis & circulatory shock on vasopressor support 1/9 severe ARDS, VASC cath placed, CRRT started 1/10 remains critically ill, started TF's 1/11 remains on CRRT, VENT   Interim History / Subjective:  Severe multiorgan failure Remains on pressors and CRRT Severe hypoxia +drug abuse   Micro Data:  COVID and FLU NEG  Antimicrobials:   Antibiotics Given (last 72 hours)     Date/Time Action Medication Dose Rate   02/05/2021 1852 New Bag/Given   metroNIDAZOLE (FLAGYL) IVPB 500 mg 500 mg 100 mL/hr   01/17/2021 1855 New Bag/Given   ceFEPIme (MAXIPIME) 2 g in sodium chloride 0.9 % 100 mL IVPB 2 g 200 mL/hr   01/21/2021 2107 New Bag/Given   vancomycin (VANCOCIN) IVPB 1000 mg/200 mL premix 1,000 mg 200 mL/hr   01/21/21 0118 New Bag/Given   piperacillin-tazobactam (ZOSYN) IVPB 2.25 g 2.25 g 100 mL/hr   01/21/21 0127 New Bag/Given   vancomycin (VANCOCIN) IVPB 1000 mg/200 mL premix 1,000 mg 200 mL/hr   01/21/21 0740 New Bag/Given   piperacillin-tazobactam (ZOSYN) IVPB 2.25 g 2.25 g 100 mL/hr   01/21/21 1540 New Bag/Given   piperacillin-tazobactam (ZOSYN) IVPB 3.375 g 3.375 g 100 mL/hr   01/21/21 2202 New Bag/Given   vancomycin (VANCOCIN) IVPB 1000 mg/200 mL premix 1,000 mg 200 mL/hr   01/21/21 2343 New Bag/Given   piperacillin-tazobactam (ZOSYN) IVPB 3.375 g 3.375 g 100 mL/hr   01/22/21 0533 New Bag/Given  piperacillin-tazobactam (ZOSYN) IVPB 3.375 g 3.375 g 100 mL/hr   01/22/21 1208 New Bag/Given   piperacillin-tazobactam (ZOSYN)  IVPB 3.375 g 3.375 g 100 mL/hr   01/22/21 1806 New Bag/Given   piperacillin-tazobactam (ZOSYN) IVPB 3.375 g 3.375 g 100 mL/hr   01/22/21 2134 New Bag/Given   vancomycin (VANCOCIN) IVPB 1000 mg/200 mL premix 1,000 mg 200 mL/hr   01/22/21 2356 New Bag/Given   piperacillin-tazobactam (ZOSYN) IVPB 3.375 g 3.375 g 100 mL/hr   01/23/21 0554 New Bag/Given   piperacillin-tazobactam (ZOSYN) IVPB 3.375 g 3.375 g 100 mL/hr          Objective   Blood pressure (!) 119/51, pulse 71, temperature 98.6 F (37 C), resp. rate 19, height _0  (1.702 m), weight 94.5 kg, SpO2 100 %.    Vent Mode: PRVC FiO2 (%):  [40 %-60 %] 60 % Set Rate:  [24 bmp] 24 bmp Vt Set:  [500 mL] 500 mL PEEP:  [8 cmH20] 8 cmH20 Plateau Pressure:  [22 cmH20-24 cmH20] 24 cmH20   Intake/Output Summary (Last 24 hours) at 01/23/2021 0724 Last data filed at 01/23/2021 0700 Gross per 24 hour  Intake 3402.36 ml  Output 4861 ml  Net -1458.64 ml    Filed Weights   01/31/2021 1813 01/19/2021 2156 01/23/21 0500  Weight: 90 kg 93 kg 94.5 kg    REVIEW OF SYSTEMS  PATIENT IS UNABLE TO PROVIDE COMPLETE REVIEW OF SYSTEMS DUE TO SEVERE CRITICAL ILLNESS AND TOXIC METABOLIC ENCEPHALOPATHY    PHYSICAL EXAMINATION:  GENERAL:critically ill appearing, +resp distress EYES: Pupils equal, round, reactive to light.  No scleral icterus.  MOUTH: Moist mucosal membrane. INTUBATED NECK: Supple.  PULMONARY: +rhonchi, +wheezing CARDIOVASCULAR: S1 and S2.  No murmurs  GASTROINTESTINAL: Soft, nontender, -distended. Positive bowel sounds.  MUSCULOSKELETAL: No swelling, clubbing, or edema.  NEUROLOGIC: obtunded SKIN:intact,warm,dry     Labs/imaging that I havepersonally reviewed  (right click and "Reselect all SmartList Selections" daily)       ASSESSMENT AND PLAN SYNOPSIS  Severe ACUTE Hypoxic and Hypercapnic Respiratory Failure with septic shock on admission with severe pancreatitis and ARDS with progressive   Severe ACUTE  Hypoxic and Hypercapnic Respiratory Failure -continue Mechanical Ventilator support -Wean Fio2 and PEEP as tolerated -VAP/VENT bundle implementation - Wean PEEP & FiO2 as tolerated, maintain SpO2 > 88% - Head of bed elevated 30 degrees, VAP protocol in place - Plateau pressures less than 30 cm H20  - Intermittent chest x-ray & ABG PRN - Ensure adequate pulmonary hygiene  -will NOT perform SAT/SBT when respiratory parameters are met   Vent Mode: PRVC FiO2 (%):  [40 %-60 %] 60 % Set Rate:  [24 bmp] 24 bmp Vt Set:  [500 mL] 500 mL PEEP:  [8 cmH20] 8 cmH20 Plateau Pressure:  [22 cmH20-24 cmH20] 24 cmH20   CARDIAC ICU monitoring   ACUTE KIDNEY INJURY/Renal Failure -continue Foley Catheter-assess need -Avoid nephrotoxic agents -Follow urine output, BMP -Ensure adequate renal perfusion, optimize oxygenation -Renal dose medications   Intake/Output Summary (Last 24 hours) at 01/23/2021 0726 Last data filed at 01/23/2021 0700 Gross per 24 hour  Intake 3402.36 ml  Output 4861 ml  Net -1458.64 ml    NEUROLOGY ACUTE TOXIC METABOLIC ENCEPHALOPATHY -need for sedation -Goal RASS -2 to -3   SEPTIC SHOCK SOURCE-pancreas,pneumonia -use vasopressors to keep MAP>65 as needed   ENDO - ICU hypoglycemic\Hyperglycemia protocol -check FSBS per protocol   GI GI PROPHYLAXIS as indicated  NUTRITIONAL STATUS DIET-->TF's as tolerated Constipation protocol as indicated  ELECTROLYTES -follow labs as needed -replace as needed -pharmacy consultation and following   Best Practice (right click and "Reselect all SmartList Selections" daily)  Diet/type: NPO w/ meds via tube DVT prophylaxis: prophylactic heparin  GI prophylaxis: H2B Lines: N/A, will be placing arterial & CVC Foley:  Yes, and it is still needed Code Status:  full code    Labs   CBC: Recent Labs  Lab 02/12/2021 1820 01/21/21 0435 01/22/21 0503 01/23/21 0458  WBC 13.6* 3.8* 5.1 7.3  NEUTROABS 11.6*  --   --    --   HGB 10.6* 11.2* 9.7* 9.2*  HCT 33.3* 33.1* 28.0* 27.4*  MCV 92.2 89.9 86.2 89.0  PLT 251 265 134* 121*     Basic Metabolic Panel: Recent Labs  Lab 01/21/21 0456 01/21/21 1545 01/21/21 2250 01/22/21 0503 01/22/21 1600 01/23/21 0458  NA 132* 131* 133* 134* 134* 135  K 4.3 4.1 3.7 4.0 3.9 4.3  CL 100 96* 97* 99 99 100  CO2 18* 21* _0 GLUCOSE 188* 125* 145* 104* 99 130*  BUN 77* 73* 50* 36* 23 18  CREATININE 4.80* 4.75* 3.16* 2.60* 1.87* 1.51*  CALCIUM 6.1* 5.7* 6.4* 6.8* 6.9* 7.4*  MG 1.9  --   --  2.2  --  2.6*  PHOS 10.0* 9.7*  --  3.7 3.0 2.4*    GFR: Estimated Creatinine Clearance: 46.2 mL/min (A) (by C-G formula based on SCr of 1.51 mg/dL (H)). Recent Labs  Lab 01/31/2021 1820 01/15/2021 2308 01/21/21 0435 01/21/21 0456 01/22/21 0503 01/23/21 0458  PROCALCITON  --   --   --  4.15 4.18 2.90  WBC 13.6*  --  3.8*  --  5.1 7.3  LATICACIDVEN 4.1* 1.7  --   --   --   --      Liver Function Tests: Recent Labs  Lab 01/19/2021 1820 01/21/21 0456 01/21/21 1545 01/22/21 0503 01/22/21 1600 01/23/21 0458  AST 84* 166*  --   --   --   --   ALT 41 69*  --   --   --   --   ALKPHOS 72 80  --   --   --   --   BILITOT 0.7 1.1  --   --   --   --   PROT 5.5* 5.8*  --   --   --   --   ALBUMIN 2.7* 2.9* 2.9* 2.7* 2.5* 2.4*    Recent Labs  Lab 02/06/2021 1820 02/04/2021 2308 01/21/21 0456  LIPASE 86*  --  54*  AMYLASE  --  267*  --     Recent Labs  Lab 01/21/21 0456  AMMONIA 42*     ABG    Component Value Date/Time   PHART 7.32 (L) 01/22/2021 1117   PCO2ART 61 (H) 01/22/2021 1117   PO2ART 63 (L) 01/22/2021 1117   HCO3 31.4 (H) 01/22/2021 1117   ACIDBASEDEF 5.8 (H) 01/21/2021 1930   O2SAT 89.7 01/22/2021 1117      Coagulation Profile: Recent Labs  Lab 02/11/2021 1820  INR 1.6*     Cardiac Enzymes: No results for input(s): CKTOTAL, CKMB, CKMBINDEX, TROPONINI in the last 168 hours.  HbA1C: Hgb A1c MFr Bld  Date/Time Value Ref Range  Status  01/22/2021 09:22 PM 5.9 (H) 4.8 - 5.6 % Final    Comment:    (NOTE) Pre diabetes:          5.7%-6.4%  Diabetes:              >  6.4%  Glycemic control for   <7.0% adults with diabetes     CBG: Recent Labs  Lab 01/22/21 1619 01/22/21 1924 01/22/21 2351 01/23/21 0355 01/23/21 0721  GLUCAP 85 105* 122* 118* 132*     Allergies No Known Allergies      DVT/GI PRX  assessed I Assessed the need for Labs I Assessed the need for Foley I Assessed the need for Central Venous Line Family Discussion when available I Assessed the need for Mobilization I made an Assessment of medications to be adjusted accordingly Safety Risk assessment completed  CASE DISCUSSED IN MULTIDISCIPLINARY ROUNDS WITH ICU TEAM     Critical Care Time devoted to patient care services described in this note is 50 minutes.  Critical care was necessary to treat /prevent imminent and life-threatening deterioration. Overall, patient is critically ill, prognosis is guarded.  Patient with Multiorgan failure and at high risk for cardiac arrest and death.    Corrin Parker, M.D.  [facility]  Medical Director [place] Director [facility] Cardio-Pulmonary Department

## 2021-01-23 NOTE — Progress Notes (Signed)
Nutrition Follow Up Note   DOCUMENTATION CODES:   Obesity unspecified  INTERVENTION:   Vital HP @55ml /hr- begin increasing by 35ml/hr q 8 hours until goal rate is reached.   Pro-Source 79ml BID via tube, provides 40kcal and 11g of protein per serving   Regimen provides 1400kcal/day, 138g/day protein and 1273ml/day of free water  Rena-vit daily via tube  Liquid MVI daily via tube   NUTRITION DIAGNOSIS:   Inadequate oral intake related to inability to eat (pt sedated and ventilated) as evidenced by NPO status.  GOAL:   Provide needs based on ASPEN/SCCM guidelines -progressing   MONITOR:   Vent status, Labs, Weight trends, Skin, I & O's  ASSESSMENT:   61 y/o female with h/o bipolar disorder, substance abuse, depression, HTN, COPD, DM and Schatzki's ring s/p esophageal dilation who is admitted with AKI, sepsis, possible pancreatitis and aspiration PNA.  Pt remains sedated and ventilated. OGT in place. Pt initiated on trickle feeds 1/10 and is tolerating well; will start increasing to goal rate today. Pt remains on CRRT. Pt up ~10lbs since admission; pt +5.4L on her I & Os.  Medications reviewed and include: insulin, rena-vit, MVI, heparin, vancomycin, vasopressin   Labs reviewed: K 4.3 wnl, creat 1.51(H), P 2.4(L), Mg 2.6(H) Hgb 9.2(L), Hct 27.4(L) Cbgs- 122, 132, 118 x 24 hrs  Patient is currently intubated on ventilator support MV: 12.9 L/min Temp (24hrs), Avg:98.4 F (36.9 C), Min:97.2 F (36.2 C), Max:98.8 F (37.1 C)  Propofol: none   MAP- >71mmHg   UOP- 64ml   Diet Order:   Diet Order             Diet NPO time specified  Diet effective now                  EDUCATION NEEDS:   No education needs have been identified at this time  Skin:  Skin Assessment: Reviewed RN Assessment  Last BM:  1/11- type 7  Height:   Ht Readings from Last 1 Encounters:  01/22/2021 5\' 7"  (1.702 m)    Weight:   Wt Readings from Last 1 Encounters:  01/23/21  94.5 kg    Ideal Body Weight:  61.36 kg  BMI:  Body mass index is 32.63 kg/m.  Estimated Nutritional Needs:   Kcal:  1023-1302kcal/day  Protein:  >125g/day  Fluid:  1.9-2.2L/day  Koleen Distance MS, RD, LDN Please refer to [facility] for RD and/or RD on-call/weekend/after hours pager

## 2021-01-23 NOTE — Progress Notes (Signed)
Patient continues with increased work of breathing, not synchronized with vent and dropping 02 SATs 88-90%.  Dr. Mortimer Fries notified scheduled IV steroids and vecuronium ordered.  Results from CXR and ABD x-ray showed no changed from previous x-rays.

## 2021-01-23 NOTE — Progress Notes (Signed)
Dr. Mortimer Fries at bedside assessing patient and reviewing labs.  Verbal order obtained to discontinue bicarb drip and phenylephrine drip.

## 2021-01-23 NOTE — Progress Notes (Signed)
[facility]  ROUNDING NOTE   Subjective:  Patient seen and evaluated at bedside in the critical care unit. Patient remains critically ill at the moment. Urine output only 80 cc over the preceding 24 hours. Still requiring low-dose norepinephrine. Patient continues to require ventilatory support at this time.   Objective:  Vital signs in last 24 hours:  Temp:  [97.9 F (36.6 C)-98.8 F (37.1 C)] 98.7 F (37.1 C) (01/11 0745) Pulse Rate:  [71-81] 72 (01/11 0756) Resp:  [13-26] 25 (01/11 0756) BP: (111-148)/(49-65) 119/51 (01/11 0700) SpO2:  [93 %-100 %] 100 % (01/11 0933) Arterial Line BP: (103-148)/(47-58) 148/58 (01/11 0700) FiO2 (%):  [40 %-60 %] 40 % (01/11 0933) Weight:  [94.5 kg] 94.5 kg (01/11 0500)  Weight change:  Filed Weights   01/21/2021 1813 01/24/2021 2156 01/23/21 0500  Weight: 90 kg 93 kg 94.5 kg    Intake/Output: I/O last 3 completed shifts: In: 5508.3 [I.V.:3718.6; NG/GT:879; IV Piggyback:910.7] Out: 4037 [Urine:135; Emesis/NG output:350; QDUKR:8381]   Intake/Output this shift:  Total I/O In: 139.4 [I.V.:99.4; NG/GT:40] Out: 334 [Urine:10; Other:324]  Physical Exam: General: Critically ill-appearing  Head: Normocephalic, atraumatic.  Endotracheal tube in place  Eyes: Anicteric  Neck: Supple  Lungs:  Scattered rhonchi, vent assisted  Heart: S1S2 no rubs  Abdomen:  Distention noted, scant bowel sounds  Extremities: Trace peripheral edema.  Neurologic: Intubated, sedated  Skin: No acute rash  Access: Left IJ temporary dialysis catheter    Basic Metabolic Panel: Recent Labs  Lab 01/21/21 0456 01/21/21 1545 01/21/21 2250 01/22/21 0503 01/22/21 1600 01/23/21 0458  NA 132* 131* 133* 134* 134* 135  K 4.3 4.1 3.7 4.0 3.9 4.3  CL 100 96* 97* 99 99 100  CO2 18* 21* '25 25 27 29  ' GLUCOSE 188* 125* 145* 104* 99 130*  BUN 77* 73* 50* 36* 23 18  CREATININE 4.80* 4.75* 3.16* 2.60* 1.87* 1.51*  CALCIUM 6.1* 5.7* 6.4* 6.8* 6.9* 7.4*  MG  1.9  --   --  2.2  --  2.6*  PHOS 10.0* 9.7*  --  3.7 3.0 2.4*     Liver Function Tests: Recent Labs  Lab 02/02/2021 1820 01/21/21 0456 01/21/21 1545 01/22/21 0503 01/22/21 1600 01/23/21 0458  AST 84* 166*  --   --   --   --   ALT 41 69*  --   --   --   --   ALKPHOS 72 80  --   --   --   --   BILITOT 0.7 1.1  --   --   --   --   PROT 5.5* 5.8*  --   --   --   --   ALBUMIN 2.7* 2.9* 2.9* 2.7* 2.5* 2.4*    Recent Labs  Lab 01/19/2021 1820 01/26/2021 2308 01/21/21 0456  LIPASE 86*  --  54*  AMYLASE  --  267*  --     Recent Labs  Lab 01/21/21 0456  AMMONIA 42*     CBC: Recent Labs  Lab 01/15/2021 1820 01/21/21 0435 01/22/21 0503 01/23/21 0458  WBC 13.6* 3.8* 5.1 7.3  NEUTROABS 11.6*  --   --   --   HGB 10.6* 11.2* 9.7* 9.2*  HCT 33.3* 33.1* 28.0* 27.4*  MCV 92.2 89.9 86.2 89.0  PLT 251 265 134* 121*     Cardiac Enzymes: No results for input(s): CKTOTAL, CKMB, CKMBINDEX, TROPONINI in the last 168 hours.  BNP: Invalid input(s): POCBNP  CBG: Recent Labs  Lab 01/22/21 1619 01/22/21 1924 01/22/21 2351 01/23/21 0355 01/23/21 0721  GLUCAP 85 105* 122* 118* 132*     Microbiology: Results for orders placed or performed during the hospital encounter of 02/03/2021  Resp Panel by RT-PCR (Flu A&B, Covid) Nasopharyngeal Swab     Status: None   Collection Time: 02/04/2021  6:20 PM   Specimen: Nasopharyngeal Swab; Nasopharyngeal(NP) swabs in vial transport medium  Result Value Ref Range Status   SARS Coronavirus 2 by RT PCR NEGATIVE NEGATIVE Final    Comment: (NOTE) SARS-CoV-2 target nucleic acids are NOT DETECTED.  The SARS-CoV-2 RNA is generally detectable in upper respiratory specimens during the acute phase of infection. The lowest concentration of SARS-CoV-2 viral copies this assay can detect is 138 copies/mL. A negative result does not preclude SARS-Cov-2 infection and should not be used as the sole basis for treatment or other patient management  decisions. A negative result may occur with  improper specimen collection/handling, submission of specimen other than nasopharyngeal swab, presence of viral mutation(s) within the areas targeted by this assay, and inadequate number of viral copies(<138 copies/mL). A negative result must be combined with clinical observations, patient history, and epidemiological information. The expected result is Negative.  Fact Sheet for Patients:  EntrepreneurPulse.com.au  Fact Sheet for Healthcare Providers:  IncredibleEmployment.be  This test is no t yet approved or cleared by the [place] FDA and  has been authorized for detection and/or diagnosis of SARS-CoV-2 by FDA under an Emergency Use Authorization (EUA). This EUA will remain  in effect (meaning this test can be used) for the duration of the COVID-19 declaration under Section 564(b)(1) of the Act, 21 U.S.C.section 360bbb-3(b)(1), unless the authorization is terminated  or revoked sooner.       Influenza A by PCR NEGATIVE NEGATIVE Final   Influenza B by PCR NEGATIVE NEGATIVE Final    Comment: (NOTE) The Xpert Xpress SARS-CoV-2/FLU/RSV plus assay is intended as an aid in the diagnosis of influenza from Nasopharyngeal swab specimens and should not be used as a sole basis for treatment. Nasal washings and aspirates are unacceptable for Xpert Xpress SARS-CoV-2/FLU/RSV testing.  Fact Sheet for Patients: EntrepreneurPulse.com.au  Fact Sheet for Healthcare Providers: IncredibleEmployment.be  This test is not yet approved or cleared by the [place] FDA and has been authorized for detection and/or diagnosis of SARS-CoV-2 by FDA under an Emergency Use Authorization (EUA). This EUA will remain in effect (meaning this test can be used) for the duration of the COVID-19 declaration under Section 564(b)(1) of the Act, 21 U.S.C. section 360bbb-3(b)(1), unless the  authorization is terminated or revoked.  Performed at [facility], [address], [place], [place] [address]   Urine Culture     Status: None   Collection Time: 02/10/2021  6:22 PM   Specimen: Urine, Random  Result Value Ref Range Status   Specimen Description   Final    URINE, RANDOM Performed at [facility], [address], [place], [place] [address]    Special Requests   Final    NONE Performed at [facility], [address], [place], [place] [address]    Culture   Final    NO GROWTH Performed at [facility], [place] [address] [place], [place] [address]    Report Status 01/22/2021 FINAL  Final  Blood Culture (routine x 2)     Status: None (Preliminary result)   Collection Time: 01/14/2021  6:23 PM   Specimen: Left Antecubital; Blood  Result Value Ref Range Status   Specimen Description LEFT ANTECUBITAL  Final   Special Requests   Final    BOTTLES DRAWN AEROBIC AND ANAEROBIC Blood Culture results may not be optimal due to an inadequate volume of blood received in culture bottles   Culture   Final    NO GROWTH 3 DAYS Performed at [facility], [address] [place], [place] [address]    Report Status PENDING  Incomplete  MRSA Next Gen by PCR, Nasal     Status: Abnormal   Collection Time: 02/07/2021  9:59 PM   Specimen: Nasal Mucosa; Nasal Swab  Result Value Ref Range Status   MRSA by PCR Next Gen DETECTED (A) NOT DETECTED Final    Comment: RESULT CALLED TO, READ BACK BY AND VERIFIED WITH: '@[id number]'  Melissa Cobb 02/09/2021 (NOTE) The GeneXpert MRSA Assay (FDA approved for NASAL specimens only), is one component of a comprehensive MRSA colonization surveillance program. It is not intended to diagnose MRSA infection nor to guide or monitor treatment for MRSA infections. Test performance is not FDA approved in patients less than 66 years old. Performed at [facility], [address] [place], [place] [address]    Culture, Respiratory w Gram Stain     Status: None (Preliminary result)   Collection Time: 01/21/21  8:35 AM   Specimen: Tracheal Aspirate; Respiratory  Result Value Ref Range Status   Specimen Description   Final    TRACHEAL ASPIRATE Performed at [facility], [address] [place], [place] [address]    Special Requests   Final    NONE Performed at [facility], [address] [place], [place] [address]    Gram Stain   Final    FEW WBC PRESENT, PREDOMINANTLY MONONUCLEAR FEW YEAST WITH PSEUDOHYPHAE RARE GRAM POSITIVE COCCI IN PAIRS    Culture   Final    ABUNDANT STAPHYLOCOCCUS AUREUS SUSCEPTIBILITIES TO FOLLOW CULTURE REINCUBATED FOR BETTER GROWTH Performed at [facility], [place] [address] [place], [place] [address]    Report Status PENDING  Incomplete  Culture, blood (Routine X 2) w Reflex to ID Panel     Status: None (Preliminary result)   Collection Time: 01/21/21 11:10 PM   Specimen: BLOOD  Result Value Ref Range Status   Specimen Description BLOOD LEFT HAND  Final   Special Requests   Final    BOTTLES DRAWN AEROBIC AND ANAEROBIC Blood Culture adequate volume   Culture   Final    NO GROWTH 2 DAYS Performed at [facility], [address], [place], [place] [address]    Report Status PENDING  Incomplete    Coagulation Studies: Recent Labs    01/19/2021 1820  LABPROT 19.1*  INR 1.6*     Urinalysis: Recent Labs    01/21/2021 1822  COLORURINE AMBER*  LABSPEC 1.020  PHURINE 5.0  GLUCOSEU NEGATIVE  HGBUR NEGATIVE  BILIRUBINUR NEGATIVE  KETONESUR NEGATIVE  PROTEINUR 30*  NITRITE NEGATIVE  LEUKOCYTESUR NEGATIVE       Imaging: DG Chest Port 1 View  Result Date: 01/22/2021 CLINICAL DATA:  Respiratory failure. EXAM: PORTABLE CHEST 1 VIEW COMPARISON:  01/21/2021 FINDINGS: The endotracheal tube is in good position at the mid tracheal level. The right IJ catheter and NG tubes are stable. Persistent diffuse interstitial and  airspace process in the lungs. No pleural effusions. IMPRESSION: 1. Stable support apparatus. 2. Persistent diffuse interstitial and airspace process. Electronically Signed   By: Ricky Stabs.D.  On: 01/22/2021 10:00   DG Chest Port 1 View  Result Date: 01/21/2021 CLINICAL DATA:  Central line placement. EXAM: PORTABLE CHEST 1 VIEW COMPARISON:  January 20, 2021. FINDINGS: Stable cardiomediastinal silhouette. Endotracheal and nasogastric tubes are in good position. Right internal jugular catheter is noted with distal tip in expected position of SVC. No pneumothorax or pleural effusion is noted. Able bilateral lung opacities are noted concerning for edema or multifocal pneumonia. Bony thorax is unremarkable. IMPRESSION: Interval placement of right internal jugular catheter with distal tip in expected position of SVC. No pneumothorax is noted. Stable support apparatus and bilateral lung opacities as described above. Electronically Signed   By: Marijo Conception M.D.   On: 01/21/2021 11:05     Medications:     prismasol BGK 4/2.5 500 mL/hr at 01/23/21 0529    prismasol BGK 4/2.5 500 mL/hr at 01/23/21 0528   sodium chloride Stopped (01/21/21 0537)   famotidine (PEPCID) IV Stopped (01/22/21 1034)   feeding supplement (VITAL HIGH PROTEIN) 1,000 mL (01/22/21 1303)   fentaNYL infusion INTRAVENOUS 150 mcg/hr (01/23/21 0900)   heparin 1,100 Units/hr (01/23/21 0900)   midazolam 7 mg/hr (01/23/21 0900)   norepinephrine (LEVOPHED) Adult infusion 5 mcg/min (01/22/21 1900)   piperacillin-tazobactam Stopped (01/23/21 0624)   prismasol BGK 4/2.5 2,000 mL/hr at 01/23/21 0828   vancomycin Stopped (01/22/21 2234)   vasopressin 0.03 Units/min (01/23/21 0900)    budesonide (PULMICORT) nebulizer solution  0.25 mg Nebulization BID   chlorhexidine gluconate (MEDLINE KIT)  15 mL Mouth Rinse BID   Chlorhexidine Gluconate Cloth  6 each Topical Q0600   docusate  100 mg Per Tube BID   feeding supplement (PROSource TF)   45 mL Per Tube BID   free water  30 mL Per Tube Q4H   insulin aspart  0-15 Units Subcutaneous Q4H   ipratropium-albuterol  3 mL Nebulization Q6H   lactulose  10 g Per Tube BID   mouth rinse  15 mL Mouth Rinse 10 times per day   multivitamin  1 tablet Per Tube QHS   multivitamin  15 mL Per Tube Daily   mupirocin ointment  1 application Nasal BID   polyethylene glycol  17 g Per Tube Daily   docusate sodium, fentaNYL, heparin sodium (porcine), midazolam, polyethylene glycol  Assessment/ Plan:  61 y.o. female with a PMHx of hypertension, COPD, tobacco abuse, diabetes mellitus type 2, bipolar disorder, depression, who was admitted to [facility] on 01/21/2021 for evaluation of bradycardia and unresponsiveness.  Patient found to have pancreatitis, acute respiratory failure and multiorgan failure.  1.  Acute kidney injury secondary to acute tubular necrosis.  Patient continues to have oliguric acute kidney injury.  Urine output only 80 cc over the preceding 24 hours.  Serum electrolytes within range on CRRT.  Continue CRRT at this time.  Lab Results  Component Value Date   CREATININE 1.51 (H) 01/23/2021   CREATININE 1.87 (H) 01/22/2021   CREATININE 2.60 (H) 01/22/2021   01/10 0701 - 01/11 0700 In: 3402.4 [I.V.:2293.4; NG/GT:659; IV Piggyback:449.9] Out: 9574 [Urine:80; Emesis/NG output:150]   2.  Acute respiratory failure.  FiO2 continues to come down slowly.  Continues to require vent support.  3.  Metabolic acidosis.  Improved with CRRT.  Continue to monitor serum bicarbonate.  4.  Hypotension.  Continues to require low-dose norepinephrine at the moment.  Maintain MAP of 65 or greater.   LOS: 3 Nhat Hearne 1/11/20239:41 AM

## 2021-01-23 NOTE — Progress Notes (Signed)
Pharmacy Antibiotic Note  Victoria Frey is a 61 y.o. female admitted on 01/16/2021 with sepsis.  Pharmacy has been consulted for vancomycin dosing. Patient with AKI started on CRRT. Tracheal aspirate with staph aureus, pending sensitivities. MRSA PCR positive.  Plan: --Continue vancomycin 1000 mg IV q24h (for CRRT) --Check vanc trough tonight at 2100 --Expect patient to likely be on vancomycin for approximately 7 days  Height: 5\' 7"  (170.2 cm) Weight: 94.5 kg (208 lb 5.4 oz) IBW/kg (Calculated) : 61.6  Temp (24hrs), Avg:98.4 F (36.9 C), Min:97.2 F (36.2 C), Max:98.8 F (37.1 C)  Recent Labs  Lab 01/13/2021 1820 02/09/2021 2308 01/21/21 0435 01/21/21 0456 01/21/21 1545 01/21/21 2250 01/22/21 0503 01/22/21 1600 01/23/21 0458  WBC 13.6*  --  3.8*  --   --   --  5.1  --  7.3  CREATININE 5.44*  --   --    < > 4.75* 3.16* 2.60* 1.87* 1.51*  LATICACIDVEN 4.1* 1.7  --   --   --   --   --   --   --    < > = values in this interval not displayed.     Estimated Creatinine Clearance: 46.2 mL/min (A) (by C-G formula based on SCr of 1.51 mg/dL (H)).    No Known Allergies  Antimicrobials this admission: Vancomycin 1/8 >>  Zosyn 1/8 >> 1/11  Microbiology results: 1/9 Resp Cx: staph aureus, sensitivities pending 1/8 BCx: NG 1/8 UCx: NG 1/8 MRSA PCR: positive  Thank you for allowing pharmacy to be a part of this patients care.  Tawnya Crook, PharmD, BCPS Clinical Pharmacist 01/23/2021 11:54 AM

## 2021-01-23 NOTE — Progress Notes (Signed)
PHARMACY CONSULT NOTE - FOLLOW UP  Pharmacy Consult for Electrolyte Monitoring and Replacement   Recent Labs: Potassium (mmol/L)  Date Value  01/23/2021 4.3  11/04/2011 4.0   Magnesium (mg/dL)  Date Value  01/23/2021 2.6 (H)   Calcium (mg/dL)  Date Value  01/23/2021 7.4 (L)   Calcium, Total (mg/dL)  Date Value  11/04/2011 8.7   Albumin (g/dL)  Date Value  01/23/2021 2.4 (L)  10/15/2020 4.8   Phosphorus (mg/dL)  Date Value  01/23/2021 2.4 (L)   Sodium (mmol/L)  Date Value  01/23/2021 135  11/04/2011 141     Assessment: 61yo female admitted 01/19/2021 with respiratory failure, multifactorial. Patient is intubated in the ICU. Currently on CRRT. Pharmacy consulted for electrolyte replacement.   Goal of Therapy:  Electrolytes WNL  Plan:  --Na phos 20 mmol IV today for ongoing phos loss s/t CRRT --Follow up with morning labs  Tawnya Crook, PharmD, BCPS Clinical Pharmacist 01/23/2021 11:47 AM

## 2021-01-23 NOTE — Progress Notes (Addendum)
ANTICOAGULATION CONSULT NOTE  Pharmacy Consult for heparin Indication:  CRRT clotting issues  No Known Allergies  Patient Measurements: Height: 5\' 7"  (170.2 cm) Weight: 94.5 kg (208 lb 5.4 oz) IBW/kg (Calculated) : 61.6 Heparin Dosing Weight: 81 kg  Vital Signs: Temp: 98.8 F (37.1 C) (01/11 0500) Temp Source: Bladder (01/10 1900) BP: 114/52 (01/11 0500) Pulse Rate: 72 (01/11 0500)  Labs: Recent Labs    01/19/2021 1820 01/15/2021 2122 01/21/21 0435 01/21/21 0456 01/21/21 2250 01/22/21 0503 01/22/21 1600 01/23/21 0458  HGB 10.6*  --  11.2*  --   --  9.7*  --  9.2*  HCT 33.3*  --  33.1*  --   --  28.0*  --  27.4*  PLT 251  --  265  --   --  134*  --  121*  APTT 41*  --   --   --   --   --   --   --   LABPROT 19.1*  --   --   --   --   --   --   --   INR 1.6*  --   --   --   --   --   --   --   HEPARINUNFRC  --   --   --   --  0.18* 0.23*  --  0.10*  CREATININE 5.44*  --   --    < > 3.16* 2.60* 1.87* 1.51*  TROPONINIHS 14 14  --   --   --   --   --   --    < > = values in this interval not displayed.     Estimated Creatinine Clearance: 46.2 mL/min (A) (by C-G formula based on SCr of 1.51 mg/dL (H)).   Medical History: Past Medical History:  Diagnosis Date   Bipolar 1 disorder ([place])    Chronic pain    COPD (chronic obstructive pulmonary disease) ([facility])    Depression    Diabetes mellitus without complication ([place])    Drug abuse ([place])    Hypertension       Assessment: 61 year old female presented with respiratory failure, multifactorial requiring intubation. Creatinine elevated on admission, patient to start on CRRT. Pharmacy consulted for heparin management for CRRT clotting issues.  Goal of Therapy:  Heparin level 0.3-0.5 units/ml or lower if no clotting issues reported Monitor platelets by anticoagulation protocol: Yes    Plan:  --Target HL on low end of therapeutic or even subtherapeutic as long as no clotting issues reported per nursing --1/11 0458 HL  down to 0.10 --Per RN, no reported issues with CRRT circuit at this time. --Will continue heparin infusion at 1100 units/hr w/ F/U by pharmacy during AM rounds --Will recheck HL with AM labs --CBC daily while on heparin  Renda Rolls, PharmD, [facility] 01/23/2021 5:58 AM

## 2021-01-23 NOTE — Progress Notes (Addendum)
Patient was weaned down to 40% fi02 and was tolerating for several hours with 02 SATs 93-96%.  Patient started desating, noted bilateral rhonchi, and increased white secretions via ETT.  Fi02 increased to 55%. Abdomen is distended but unchanged from morning assessment. Dr. Mortimer Fries notified and order obtained for CXR and ABD x-ray.

## 2021-01-24 ENCOUNTER — Inpatient Hospital Stay: Payer: Medicare Other

## 2021-01-24 DIAGNOSIS — R4189 Other symptoms and signs involving cognitive functions and awareness: Secondary | ICD-10-CM | POA: Diagnosis not present

## 2021-01-24 LAB — RENAL FUNCTION PANEL
Albumin: 2.5 g/dL — ABNORMAL LOW (ref 3.5–5.0)
Albumin: 2.4 g/dL — ABNORMAL LOW (ref 3.5–5.0)
Anion gap: 4 — ABNORMAL LOW (ref 5–15)
Anion gap: 7 (ref 5–15)
BUN: 19 mg/dL (ref 8–23)
BUN: 26 mg/dL — ABNORMAL HIGH (ref 8–23)
CO2: 26 mmol/L (ref 22–32)
CO2: 29 mmol/L (ref 22–32)
Calcium: 7.4 mg/dL — ABNORMAL LOW (ref 8.9–10.3)
Calcium: 8.1 mg/dL — ABNORMAL LOW (ref 8.9–10.3)
Chloride: 105 mmol/L (ref 98–111)
Chloride: 102 mmol/L (ref 98–111)
Creatinine, Ser: 1.3 mg/dL — ABNORMAL HIGH (ref 0.44–1.00)
Creatinine, Ser: 1.38 mg/dL — ABNORMAL HIGH (ref 0.44–1.00)
GFR, Estimated: 47 mL/min — ABNORMAL LOW (ref >60)
GFR, Estimated: 44 mL/min — ABNORMAL LOW (ref >60)
Glucose, Bld: 166 mg/dL — ABNORMAL HIGH (ref 70–99)
Glucose, Bld: 188 mg/dL — ABNORMAL HIGH (ref 70–99)
Phosphorus: 3.6 mg/dL (ref 2.5–4.6)
Phosphorus: 3.2 mg/dL (ref 2.5–4.6)
Potassium: 4.6 mmol/L (ref 3.5–5.1)
Potassium: 4.7 mmol/L (ref 3.5–5.1)
Sodium: 135 mmol/L (ref 135–145)
Sodium: 138 mmol/L (ref 135–145)

## 2021-01-24 LAB — BLOOD GAS, ARTERIAL
Acid-Base Excess: 0.9 mmol/L (ref 0.0–2.0)
Bicarbonate: 28.7 mmol/L — ABNORMAL HIGH (ref 20.0–28.0)
FIO2: 0.5
MECHVT: 500 mL
Mechanical Rate: 24
O2 Saturation: 98.6 %
PEEP: 10 cmH2O
Patient temperature: 37
pCO2 arterial: 64 mmHg — ABNORMAL HIGH (ref 32.0–48.0)
pH, Arterial: 7.26 — ABNORMAL LOW (ref 7.350–7.450)
pO2, Arterial: 133 mmHg — ABNORMAL HIGH (ref 83.0–108.0)

## 2021-01-24 LAB — GLUCOSE, CAPILLARY
Glucose-Capillary: 152 mg/dL — ABNORMAL HIGH (ref 70–99)
Glucose-Capillary: 165 mg/dL — ABNORMAL HIGH (ref 70–99)
Glucose-Capillary: 184 mg/dL — ABNORMAL HIGH (ref 70–99)
Glucose-Capillary: 161 mg/dL — ABNORMAL HIGH (ref 70–99)
Glucose-Capillary: 169 mg/dL — ABNORMAL HIGH (ref 70–99)
Glucose-Capillary: 218 mg/dL — ABNORMAL HIGH (ref 70–99)

## 2021-01-24 LAB — CULTURE, RESPIRATORY W GRAM STAIN

## 2021-01-24 LAB — CBC
HCT: 26.3 % — ABNORMAL LOW (ref 36.0–46.0)
Hemoglobin: 8.6 g/dL — ABNORMAL LOW (ref 12.0–15.0)
MCH: 29.6 pg (ref 26.0–34.0)
MCHC: 32.7 g/dL (ref 30.0–36.0)
MCV: 90.4 fL (ref 80.0–100.0)
Platelets: 107 10*3/uL — ABNORMAL LOW (ref 150–400)
RBC: 2.91 MIL/uL — ABNORMAL LOW (ref 3.87–5.11)
RDW: 16.1 % — ABNORMAL HIGH (ref 11.5–15.5)
WBC: 8.9 10*3/uL (ref 4.0–10.5)
nRBC: 0 % (ref 0.0–0.2)

## 2021-01-24 LAB — HEPARIN LEVEL (UNFRACTIONATED): Heparin Unfractionated: <0.1 IU/mL — ABNORMAL LOW (ref 0.30–0.70)

## 2021-01-24 LAB — MAGNESIUM: Magnesium: 2.5 mg/dL — ABNORMAL HIGH (ref 1.7–2.4)

## 2021-01-24 MED ORDER — FAMOTIDINE 20 MG PO TABS
20.0000 mg | ORAL_TABLET | ORAL | Status: DC
  Administered 2021-01-24 – 2021-01-26 (×2): 20 mg
  Filled 2021-01-24 (×2): qty 1

## 2021-01-24 MED ORDER — IPRATROPIUM-ALBUTEROL 0.5-2.5 (3) MG/3ML IN SOLN
3.0000 mL | Freq: Three times a day (TID) | RESPIRATORY_TRACT | Status: DC
  Administered 2021-01-24 – 2021-01-27 (×10): 3 mL via RESPIRATORY_TRACT
  Filled 2021-01-24 (×10): qty 3

## 2021-01-24 NOTE — Progress Notes (Signed)
Dr. Holley Raring notified of patients lab results. At this time no changes. Continue to assess.

## 2021-01-24 NOTE — Progress Notes (Signed)
Advanced Orogastric tube 20 cm. Continue to assess.

## 2021-01-24 NOTE — Significant Event (Signed)
Pt began to desat with increased work of breathing. Oxygen dropped into high 60s. Pt bagged and given prn vec. NP Ouma aware, ordered cxr and abg.

## 2021-01-24 NOTE — Progress Notes (Signed)
GOALS OF CARE DISCUSSION  The Clinical status was relayed to family in detail. Son Corene Cornea overt he phone Updated and notified of patients medical condition.    Patient remains unresponsive and will not open eyes to command.   Patient is having a weak cough and struggling to remove secretions.   Patient with increased WOB and using accessory muscles to breathe Explained to family course of therapy and the modalities    Patient with Progressive multiorgan failure with a very high probablity of a very minimal chance of meaningful recovery despite all aggressive and optimal medical therapy.  PATIENT REMAINS FULL CODE  Family understands the situation.  Severe multiorgan failure with pancreatitis and COPD and renal failure  Family are satisfied with Plan of action and management. All questions answered  Additional CC time 25 mins   Jalyiah Shelley Patricia Pesa, M.D.  [facility]  Medical Director [place] Director [facility] Cardio-Pulmonary Department

## 2021-01-24 NOTE — Progress Notes (Signed)
CRRT filter life expired. Stopped CRRT and changed cartridge and restarted per orders. Continue to monitor.

## 2021-01-24 NOTE — Progress Notes (Signed)
Dr. Mortimer Fries into assess patient. Patient unresponsive, does have a gag reflex.Orders to titrate sedation down slowly and use PRN boluses of versed or fentanyl if needed. Continue to assess.

## 2021-01-24 NOTE — Progress Notes (Signed)
NAME:  Victoria Frey, MRN:  [id number], DOB:  [birth year], LOS: 4 ADMISSION DATE:  02/04/2021  History of Present Illness:  61 yo F with LKW Saturday 01/19/21, when patient complained to a family member she didn't feel well and was having trouble going to the bathroom. EMS called by family 01/18/2021 due to confusion & slurred speech. Per ED report on EMS arrival patient appeared confused and stumbling. Initially, patient was able to walk with assistance to the ambulance, but during transport she became unconscious, bradycardic in the 30's and hypotensive. Patient TC paced and intubated in the field, pt received 500 mL bolus. ED course: Workup completed per Sepsis protocol. Medications given: 3 L NS bolus, Levophed drip started, cefepime/flagyl/vancomycin Initial Vitals: hypothermic 83.5, RR- 14, initially hypertensive 237/73 then subsequently hypotensive 46/32, SpO2 99% on 60 % FiO2 with mechanical ventilatory support Significant labs: (Labs/ Imaging personally reviewed) I, Domingo Pulse [place], AGACNP-BC, personally viewed and interpreted this ECG. Chemistry: mild hyponatremia Na+:133, K+: 3.9, BUN/Cr.: 75/ 5.44, Serum CO2/ AG: 11/18, Alk phos: 72, albumin: 2.7, lipase: 86, AST/ALT: 84/41 Hematology: leukocytosis WBC: 13.6, Hgb: 10.6,  Lactic: 4.1, COVID-19 & Influenza A/B: negative ABG: 6.94/ 46/ 116/ 9.9 CXR 01/29/2021: Diffuse bilateral airspace disease, favor edema/CHF CT chest wo contrast 02/05/2021: Patchy ground-glass densities and consolidation throughout both lungs, concerning for multifocal pneumonia. Alternatively, this could reflect ARDS in the setting of pancreatitis. Trace bilateral pleural effusions in the setting of emphysema.  CT Abdomen and pelvis wo contrast 01/24/2021: Acute pancreatitis.  No peripancreatic fluid collection. Bilateral nonobstructive nephrolithiasis. [place] wo contrast 02/12/2021: no acute intracranial abnormality   PCCM consulted for admission due to acute respiratory failure  and multi-organ failure requiring vasopressors and mechanical ventilatory support.   Pertinent  Medical History  Bipolar disorder 1 & depression HTN COPD not on chronic O2 Current smoker T2DM Polysubstance abuse Significant Hospital Events: Including procedures, antibiotic start and stop dates in addition to other pertinent events   02/07/2021: Admit to ICU with acute respiratory failure requiring mechanical ventilatory support in the setting of acute pancreatitis & circulatory shock on vasopressor support 1/9 severe ARDS, VASC cath placed, CRRT started 1/10 remains critically ill, started TF's 1/11 remains on CRRT, VENT, FEEDS ON HOLD 1/12 increased WOB, on CRRT, on VENT   Interim History / Subjective:  Severe Multiorgan failure Remains critically ill Restart feeds today On pressors Severe hypoxia +COPD exacerbation   Micro Data:  COVID and FLU NEG  Antimicrobials:   Antibiotics Given (last 72 hours)     Date/Time Action Medication Dose Rate   01/21/21 0740 New Bag/Given   piperacillin-tazobactam (ZOSYN) IVPB 2.25 g 2.25 g 100 mL/hr   01/21/21 1540 New Bag/Given   piperacillin-tazobactam (ZOSYN) IVPB 3.375 g 3.375 g 100 mL/hr   01/21/21 2202 New Bag/Given   vancomycin (VANCOCIN) IVPB 1000 mg/200 mL premix 1,000 mg 200 mL/hr   01/21/21 2343 New Bag/Given   piperacillin-tazobactam (ZOSYN) IVPB 3.375 g 3.375 g 100 mL/hr   01/22/21 0533 New Bag/Given   piperacillin-tazobactam (ZOSYN) IVPB 3.375 g 3.375 g 100 mL/hr   01/22/21 1208 New Bag/Given   piperacillin-tazobactam (ZOSYN) IVPB 3.375 g 3.375 g 100 mL/hr   01/22/21 1806 New Bag/Given   piperacillin-tazobactam (ZOSYN) IVPB 3.375 g 3.375 g 100 mL/hr   01/22/21 2134 New Bag/Given   vancomycin (VANCOCIN) IVPB 1000 mg/200 mL premix 1,000 mg 200 mL/hr   01/22/21 2356 New Bag/Given   piperacillin-tazobactam (ZOSYN) IVPB 3.375 g 3.375 g 100 mL/hr   01/23/21  5038 New Bag/Given   piperacillin-tazobactam (ZOSYN) IVPB 3.375 g  3.375 g 100 mL/hr   01/23/21 2155 New Bag/Given   vancomycin (VANCOREADY) IVPB 1250 mg/250 mL 1,250 mg 166.7 mL/hr          Objective   Blood pressure (!) 111/55, pulse 66, temperature 98.4 F (36.9 C), resp. rate (!) 21, height 5' 7.01" (1.702 m), weight 93.9 kg, SpO2 100 %.    Vent Mode: PRVC FiO2 (%):  [40 %-65 %] 50 % Set Rate:  [24 bmp] 24 bmp Vt Set:  [500 mL] 500 mL PEEP:  [8 cmH20-10 cmH20] 10 cmH20 Plateau Pressure:  [15 cmH20] 15 cmH20   Intake/Output Summary (Last 24 hours) at 01/24/2021 0719 Last data filed at 01/24/2021 0600 Gross per 24 hour  Intake 2420.54 ml  Output 3703 ml  Net -1282.46 ml    Filed Weights   02/10/2021 2156 01/23/21 0500 01/24/21 0409  Weight: 93 kg 94.5 kg 93.9 kg    REVIEW OF SYSTEMS  PATIENT IS UNABLE TO PROVIDE COMPLETE REVIEW OF SYSTEMS DUE TO SEVERE CRITICAL ILLNESS AND TOXIC METABOLIC ENCEPHALOPATHY    PHYSICAL EXAMINATION:  GENERAL:critically ill appearing, +resp distress EYES: Pupils equal, round, reactive to light.  No scleral icterus.  MOUTH: Moist mucosal membrane. INTUBATED NECK: Supple.  PULMONARY: +rhonchi, +wheezing CARDIOVASCULAR: S1 and S2.  No murmurs  GASTROINTESTINAL: Soft, nontender, -distended. Positive bowel sounds.  MUSCULOSKELETAL: No swelling, clubbing, or edema.  NEUROLOGIC: obtunded SKIN:intact,warm,dry    Labs/imaging that I havepersonally reviewed  (right click and "Reselect all SmartList Selections" daily)      ASSESSMENT AND PLAN SYNOPSIS  Severe ACUTE Hypoxic and Hypercapnic Respiratory Failure with septic shock on admission with severe pancreatitis and ARDS with progressive   Severe ACUTE Hypoxic and Hypercapnic Respiratory Failure -continue Mechanical Ventilator support -Wean Fio2 and PEEP as tolerated -VAP/VENT bundle implementation - Wean PEEP & FiO2 as tolerated, maintain SpO2 > 88% - Head of bed elevated 30 degrees, VAP protocol in place - Plateau pressures less than 30 cm H20   - Intermittent chest x-ray & ABG PRN - Ensure adequate pulmonary hygiene  -will NOT perform SAT/SBT when respiratory parameters are met  SEVERE COPD EXACERBATION -continue IV steroids as prescribed -continue NEB THERAPY as prescribed -morphine as needed -wean fio2 as needed and tolerated   Vent Mode: PRVC FiO2 (%):  [40 %-65 %] 50 % Set Rate:  [24 bmp] 24 bmp Vt Set:  [500 mL] 500 mL PEEP:  [8 cmH20-10 cmH20] 10 cmH20 Plateau Pressure:  [15 cmH20] 15 cmH20   CARDIAC ICU monitoring  ACUTE KIDNEY INJURY/Renal Failure -continue Foley Catheter-assess need -Avoid nephrotoxic agents -Follow urine output, BMP -Ensure adequate renal perfusion, optimize oxygenation -Renal dose medications On CRRT   Intake/Output Summary (Last 24 hours) at 01/24/2021 0720 Last data filed at 01/24/2021 0700 Gross per 24 hour  Intake 2461.51 ml  Output 3703 ml  Net -1241.49 ml    NEUROLOGY ACUTE TOXIC METABOLIC ENCEPHALOPATHY -need for sedation -Goal RASS -2 to -3  SEPTIC shock SOURCE-pancreatitis -use vasopressors to keep MAP>65 as needed -follow ABG and LA as needed -follow up cultures   ENDO - ICU hypoglycemic\Hyperglycemia protocol -check FSBS per protocol   GI GI PROPHYLAXIS as indicated  NUTRITIONAL STATUS DIET-->RESTART TF's as tolerated Constipation protocol as indicated   ELECTROLYTES -follow labs as needed -replace as needed -pharmacy consultation and following   Best Practice (right click and "Reselect all SmartList Selections" daily)  Diet/type: NPO w/ meds via tube DVT  prophylaxis: prophylactic heparin  GI prophylaxis: H2B Lines: N/A, will be placing arterial & CVC Foley:  Yes, and it is still needed Code Status:  full code    Labs   CBC: Recent Labs  Lab 01/16/2021 1820 01/21/21 0435 01/22/21 0503 01/23/21 0458 01/24/21 0448  WBC 13.6* 3.8* 5.1 7.3 8.9  NEUTROABS 11.6*  --   --   --   --   HGB 10.6* 11.2* 9.7* 9.2* 8.6*  HCT 33.3* 33.1* 28.0*  27.4* 26.3*  MCV 92.2 89.9 86.2 89.0 90.4  PLT 251 265 134* 121* 107*     Basic Metabolic Panel: Recent Labs  Lab 01/21/21 0456 01/21/21 1545 01/22/21 0503 01/22/21 1600 01/23/21 0458 01/23/21 1551 01/24/21 0448  NA 132*   < > 134* 134* 135 137 135  K 4.3   < > 4.0 3.9 4.3 4.3 4.6  CL 100   < > 99 99 100 101 105  CO2 18*   < > _0 GLUCOSE 188*   < > 104* 99 130* 128* 166*  BUN 77*   < > 36* _1 CREATININE 4.80*   < > 2.60* 1.87* 1.51* 1.48* 1.30*  CALCIUM 6.1*   < > 6.8* 6.9* 7.4* 7.7* 7.4*  MG 1.9  --  2.2  --  2.6*  --  2.5*  PHOS 10.0*   < > 3.7 3.0 2.4* 2.9 3.6   < > = values in this interval not displayed.    GFR: Estimated Creatinine Clearance: 53.4 mL/min (A) (by C-G formula based on SCr of 1.3 mg/dL (H)). Recent Labs  Lab 02/01/2021 1820 01/18/2021 2308 01/21/21 0435 01/21/21 0456 01/22/21 0503 01/23/21 0458 01/24/21 0448  PROCALCITON  --   --   --  4.15 4.18 2.90  --   WBC 13.6*  --  3.8*  --  5.1 7.3 8.9  LATICACIDVEN 4.1* 1.7  --   --   --   --   --      Liver Function Tests: Recent Labs  Lab 01/22/2021 1820 01/21/21 0456 01/21/21 1545 01/22/21 0503 01/22/21 1600 01/23/21 0458 01/23/21 1551 01/24/21 0448  AST 84* 166*  --   --   --   --   --   --   ALT 41 69*  --   --   --   --   --   --   ALKPHOS 72 80  --   --   --   --   --   --   BILITOT 0.7 1.1  --   --   --   --   --   --   PROT 5.5* 5.8*  --   --   --   --   --   --   ALBUMIN 2.7* 2.9*   < > 2.7* 2.5* 2.4* 2.5* 2.5*   < > = values in this interval not displayed.    Recent Labs  Lab 01/14/2021 1820 01/15/2021 2308 01/21/21 0456  LIPASE 86*  --  54*  AMYLASE  --  267*  --     Recent Labs  Lab 01/21/21 0456  AMMONIA 42*     ABG    Component Value Date/Time   PHART 7.35 01/23/2021 0800   PCO2ART 56 (H) 01/23/2021 0800   PO2ART 139 (H) 01/23/2021 0800   HCO3 30.9 (H) 01/23/2021 0800   ACIDBASEDEF 5.8 (H) 01/21/2021 1930   O2SAT 99.0 01/23/2021 0800  Coagulation Profile: Recent Labs  Lab 01/13/2021 1820  INR 1.6*     Cardiac Enzymes: No results for input(s): CKTOTAL, CKMB, CKMBINDEX, TROPONINI in the last 168 hours.  HbA1C: Hgb A1c MFr Bld  Date/Time Value Ref Range Status  02/07/2021 09:22 PM 5.9 (H) 4.8 - 5.6 % Final    Comment:    (NOTE) Pre diabetes:          5.7%-6.4%  Diabetes:              >6.4%  Glycemic control for   <7.0% adults with diabetes     CBG: Recent Labs  Lab 01/23/21 1124 01/23/21 1546 01/23/21 1923 01/23/21 2349 01/24/21 0313  GLUCAP 122* 97 174* 134* 152*     Allergies No Known Allergies     DVT/GI PRX  assessed I Assessed the need for Labs I Assessed the need for Foley I Assessed the need for Central Venous Line Family Discussion when available I Assessed the need for Mobilization I made an Assessment of medications to be adjusted accordingly Safety Risk assessment completed  CASE DISCUSSED IN MULTIDISCIPLINARY ROUNDS WITH ICU TEAM     Critical Care Time devoted to patient care services described in this note is 55 minutes.  Critical care was necessary to treat /prevent imminent and life-threatening deterioration. Overall, patient is critically ill, prognosis is guarded.  Patient with Multiorgan failure and at high risk for cardiac arrest and death.   ANTICIPATE PROLONGED ICU LOS   Corrin Parker, M.D.  [facility]  Medical Director Elaine Director [facility] Cardio-Pulmonary Department

## 2021-01-24 NOTE — Progress Notes (Signed)
Chaplain On-Call responded to a call from [employer], who reported that the patient's Nurse requested support for the family.  Chaplain met patient's daughter-in-law Claiborne Billings and sister-in-law Mariann Laster at bedside. They stated the "domino effect", in their words, of several recent medical challenges for the patient, including sepsis and kidney failure. The patient is non-responsive and is on ventilator support.  Chaplain provided spiritual and emotional support and prayer, and assured the family of availability for additional support.  Chaplain Pollyann Samples M.Div., [facility]

## 2021-01-24 NOTE — Progress Notes (Signed)
[facility]  ROUNDING NOTE   Subjective:  Patient remains critically ill at the moment. Still oliguric. Continues on CRRT at this time. Continues to require mechanical ventilation.   Objective:  Vital signs in last 24 hours:  Temp:  [96.6 F (35.9 C)-99 F (37.2 C)] 97.3 F (36.3 C) (01/12 0800) Pulse Rate:  [64-80] 75 (01/12 0814) Resp:  [15-26] 26 (01/12 0814) BP: (97-125)/(41-72) 108/53 (01/12 0800) SpO2:  [90 %-100 %] 93 % (01/12 0814) Arterial Line BP: (110-151)/(45-81) 129/54 (01/12 0700) FiO2 (%):  [5 %-65 %] 5 % (01/12 0814) Weight:  [93.9 kg] 93.9 kg (01/12 0409)  Weight change: -0.6 kg Filed Weights   01/25/2021 2156 01/23/21 0500 01/24/21 0409  Weight: 93 kg 94.5 kg 93.9 kg    Intake/Output: I/O last 3 completed shifts: In: 4298.3 [I.V.:2107.7; NG/GT:1383.3; IV Piggyback:807.2] Out: 6105 [Urine:80; Emesis/NG output:30; PYYFR:1021]   Intake/Output this shift:  Total I/O In: 95 [I.V.:40; NG/GT:55] Out: 145 [Other:145]  Physical Exam: General: Critically ill-appearing  Head: Normocephalic, atraumatic.  Endotracheal tube in place  Eyes: Anicteric  Neck: Supple  Lungs:  Scattered rhonchi, vent assisted  Heart: S1S2 no rubs  Abdomen:  Distention noted, scant bowel sounds  Extremities: Trace peripheral edema.  Neurologic: Intubated, sedated  Skin: No acute rash  Access: Left IJ temporary dialysis catheter    Basic Metabolic Panel: Recent Labs  Lab 01/21/21 0456 01/21/21 1545 01/22/21 0503 01/22/21 1600 01/23/21 0458 01/23/21 1551 01/24/21 0448  NA 132*   < > 134* 134* 135 137 135  K 4.3   < > 4.0 3.9 4.3 4.3 4.6  CL 100   < > 99 99 100 101 105  CO2 18*   < > '25 27 29 29 26  ' GLUCOSE 188*   < > 104* 99 130* 128* 166*  BUN 77*   < > 36* '23 18 17 19  ' CREATININE 4.80*   < > 2.60* 1.87* 1.51* 1.48* 1.30*  CALCIUM 6.1*   < > 6.8* 6.9* 7.4* 7.7* 7.4*  MG 1.9  --  2.2  --  2.6*  --  2.5*  PHOS 10.0*   < > 3.7 3.0 2.4* 2.9 3.6   < > =  values in this interval not displayed.     Liver Function Tests: Recent Labs  Lab 02/05/2021 1820 01/21/21 0456 01/21/21 1545 01/22/21 0503 01/22/21 1600 01/23/21 0458 01/23/21 1551 01/24/21 0448  AST 84* 166*  --   --   --   --   --   --   ALT 41 69*  --   --   --   --   --   --   ALKPHOS 72 80  --   --   --   --   --   --   BILITOT 0.7 1.1  --   --   --   --   --   --   PROT 5.5* 5.8*  --   --   --   --   --   --   ALBUMIN 2.7* 2.9*   < > 2.7* 2.5* 2.4* 2.5* 2.5*   < > = values in this interval not displayed.    Recent Labs  Lab 02/05/2021 1820 01/18/2021 2308 01/21/21 0456  LIPASE 86*  --  54*  AMYLASE  --  267*  --     Recent Labs  Lab 01/21/21 0456  AMMONIA 42*     CBC: Recent Labs  Lab 01/29/2021  1820 01/21/21 0435 01/22/21 0503 01/23/21 0458 01/24/21 0448  WBC 13.6* 3.8* 5.1 7.3 8.9  NEUTROABS 11.6*  --   --   --   --   HGB 10.6* 11.2* 9.7* 9.2* 8.6*  HCT 33.3* 33.1* 28.0* 27.4* 26.3*  MCV 92.2 89.9 86.2 89.0 90.4  PLT 251 265 134* 121* 107*     Cardiac Enzymes: No results for input(s): CKTOTAL, CKMB, CKMBINDEX, TROPONINI in the last 168 hours.  BNP: Invalid input(s): POCBNP  CBG: Recent Labs  Lab 01/23/21 1546 01/23/21 1923 01/23/21 2349 01/24/21 0313 01/24/21 0738  GLUCAP 97 174* 134* 152* 165*     Microbiology: Results for orders placed or performed during the hospital encounter of 01/22/2021  Resp Panel by RT-PCR (Flu A&B, Covid) Nasopharyngeal Swab     Status: None   Collection Time: 01/13/2021  6:20 PM   Specimen: Nasopharyngeal Swab; Nasopharyngeal(NP) swabs in vial transport medium  Result Value Ref Range Status   SARS Coronavirus 2 by RT PCR NEGATIVE NEGATIVE Final    Comment: (NOTE) SARS-CoV-2 target nucleic acids are NOT DETECTED.  The SARS-CoV-2 RNA is generally detectable in upper respiratory specimens during the acute phase of infection. The lowest concentration of SARS-CoV-2 viral copies this assay can detect is 138  copies/mL. A negative result does not preclude SARS-Cov-2 infection and should not be used as the sole basis for treatment or other patient management decisions. A negative result may occur with  improper specimen collection/handling, submission of specimen other than nasopharyngeal swab, presence of viral mutation(s) within the areas targeted by this assay, and inadequate number of viral copies(<138 copies/mL). A negative result must be combined with clinical observations, patient history, and epidemiological information. The expected result is Negative.  Fact Sheet for Patients:  EntrepreneurPulse.com.au  Fact Sheet for Healthcare Providers:  IncredibleEmployment.be  This test is no t yet approved or cleared by the [place] FDA and  has been authorized for detection and/or diagnosis of SARS-CoV-2 by FDA under an Emergency Use Authorization (EUA). This EUA will remain  in effect (meaning this test can be used) for the duration of the COVID-19 declaration under Section 564(b)(1) of the Act, 21 U.S.C.section 360bbb-3(b)(1), unless the authorization is terminated  or revoked sooner.       Influenza A by PCR NEGATIVE NEGATIVE Final   Influenza B by PCR NEGATIVE NEGATIVE Final    Comment: (NOTE) The Xpert Xpress SARS-CoV-2/FLU/RSV plus assay is intended as an aid in the diagnosis of influenza from Nasopharyngeal swab specimens and should not be used as a sole basis for treatment. Nasal washings and aspirates are unacceptable for Xpert Xpress SARS-CoV-2/FLU/RSV testing.  Fact Sheet for Patients: EntrepreneurPulse.com.au  Fact Sheet for Healthcare Providers: IncredibleEmployment.be  This test is not yet approved or cleared by the [place] FDA and has been authorized for detection and/or diagnosis of SARS-CoV-2 by FDA under an Emergency Use Authorization (EUA). This EUA will remain in effect (meaning  this test can be used) for the duration of the COVID-19 declaration under Section 564(b)(1) of the Act, 21 U.S.C. section 360bbb-3(b)(1), unless the authorization is terminated or revoked.  Performed at [facility], [address] [place], [place] [address]   Urine Culture     Status: None   Collection Time: 02/10/2021  6:22 PM   Specimen: Urine, Random  Result Value Ref Range Status   Specimen Description   Final    URINE, RANDOM Performed at [facility], [place], [place]  [address]    Special Requests   Final    NONE Performed at [facility], [address] [place], [place] [address]    Culture   Final    NO GROWTH Performed at [facility], [place] [address] [place], [place] [address]    Report Status 01/22/2021 FINAL  Final  Blood Culture (routine x 2)     Status: None (Preliminary result)   Collection Time: 02/05/2021  6:23 PM   Specimen: Left Antecubital; Blood  Result Value Ref Range Status   Specimen Description LEFT ANTECUBITAL  Final   Special Requests   Final    BOTTLES DRAWN AEROBIC AND ANAEROBIC Blood Culture results may not be optimal due to an inadequate volume of blood received in culture bottles   Culture   Final    NO GROWTH 4 DAYS Performed at [facility], [address], [place], [place] [address]    Report Status PENDING  Incomplete  MRSA Next Gen by PCR, Nasal     Status: Abnormal   Collection Time: 01/16/2021  9:59 PM   Specimen: Nasal Mucosa; Nasal Swab  Result Value Ref Range Status   MRSA by PCR Next Gen DETECTED (A) NOT DETECTED Final    Comment: RESULT CALLED TO, READ BACK BY AND VERIFIED WITH: '@[id number]'  Melissa Cobb 02/06/2021 (NOTE) The GeneXpert MRSA Assay (FDA approved for NASAL specimens only), is one component of a comprehensive MRSA colonization surveillance program. It is not intended to diagnose MRSA infection nor to guide or monitor treatment for MRSA infections. Test  performance is not FDA approved in patients less than 48 years old. Performed at [facility], [address] [place], [place] [address]   Culture, Respiratory w Gram Stain     Status: None (Preliminary result)   Collection Time: 01/21/21  8:35 AM   Specimen: Tracheal Aspirate; Respiratory  Result Value Ref Range Status   Specimen Description   Final    TRACHEAL ASPIRATE Performed at [facility], [address] [place], [place] [address]    Special Requests   Final    NONE Performed at [facility], [address] [place], [place] [address]    Gram Stain   Final    FEW WBC PRESENT, PREDOMINANTLY MONONUCLEAR FEW YEAST WITH PSEUDOHYPHAE RARE GRAM POSITIVE COCCI IN PAIRS    Culture   Final    ABUNDANT STAPHYLOCOCCUS AUREUS SUSCEPTIBILITIES TO FOLLOW CULTURE REINCUBATED FOR BETTER GROWTH Performed at [facility], [place] [address], [place], [place] [address]    Report Status PENDING  Incomplete  Culture, blood (Routine X 2) w Reflex to ID Panel     Status: None (Preliminary result)   Collection Time: 01/21/21 11:10 PM   Specimen: BLOOD  Result Value Ref Range Status   Specimen Description BLOOD LEFT HAND  Final   Special Requests   Final    BOTTLES DRAWN AEROBIC AND ANAEROBIC Blood Culture adequate volume   Culture   Final    NO GROWTH 3 DAYS Performed at [facility], [address] [place], [place] [address]    Report Status PENDING  Incomplete    Coagulation Studies: No results for input(s): LABPROT, INR in the last 72 hours.   Urinalysis: No results for input(s): COLORURINE, LABSPEC, PHURINE, GLUCOSEU, HGBUR, BILIRUBINUR, KETONESUR, PROTEINUR, UROBILINOGEN, NITRITE, LEUKOCYTESUR in the last 72 hours.  Invalid input(s): APPERANCEUR     Imaging: DG Chest 1 View  Result Date: 01/24/2021 CLINICAL DATA:  61 year old female with history of shortness of breath. EXAM: CHEST  1 VIEW COMPARISON:  Chest x-ray 01/23/2021.  FINDINGS: An endotracheal tube is in place with tip 4.8 cm above the carina. There is a right-sided internal jugular central venous catheter with tip terminating in the mid superior vena cava. Nasogastric tube has been partially withdrawn, with tip in the mid esophagus. Lung volumes are low. Diffuse peribronchial cuffing, widespread interstitial opacities and ill-defined opacities are again scattered throughout the lungs bilaterally, overall slightly improved aeration compared to the prior examination. No pleural effusions. Pulmonary vasculature does not appearing origin. Heart size is normal. Upper mediastinal contours are within normal limits. Atherosclerotic calcifications are noted in the thoracic aorta. IMPRESSION: 1. Support apparatus, as above. Please take note of the high position of the nasogastric tube which is now in the mid esophagus, and consider advancement approximally 20 cm for more optimal placement. 2. The appearance the chest suggests resolving multilobar bilateral bronchopneumonia, as above. 3. Aortic atherosclerosis. No active disease. Electronically Signed   By: Vinnie Langton M.D.   On: 01/24/2021 07:25   DG Abd 1 View  Result Date: 01/23/2021 CLINICAL DATA:  Abdominal distension. EXAM: ABDOMEN - 1 VIEW COMPARISON:  View abdomen 01/21/21 and one-view abdomen 02/09/2021. FINDINGS: Side port of the NG tube is in the stomach. Left IJ femoral line is stable. Bowel gas pattern is unremarkable. No obstruction or free air is present. Axial skeleton is within limits. Interstitial edema or airspace disease noted at right base. IMPRESSION: 1. Side port of the NG tube is in the stomach. 2. No acute abnormality. Electronically Signed   By: San Morelle M.D.   On: 01/23/2021 14:12   DG CHEST PORT 1 VIEW  Result Date: 01/23/2021 CLINICAL DATA:  Hypoxia. EXAM: PORTABLE CHEST 1 VIEW COMPARISON:  01/22/2021 FINDINGS: Endotracheal tube is near the carina and similar to the previous examination.  Nasogastric tube extends into the abdomen. No significant change in the patchy bilateral airspace disease. There is a right jugular dialysis catheter with the tip in the lower SVC. Heart size is within normal limits and stable. Negative for pneumothorax. IMPRESSION: 1. No change in the bilateral airspace densities. 2. Endotracheal tube is stable in position near the carina. Electronically Signed   By: Markus Daft M.D.   On: 01/23/2021 14:13   DG Chest Port 1 View  Result Date: 01/22/2021 CLINICAL DATA:  Respiratory failure. EXAM: PORTABLE CHEST 1 VIEW COMPARISON:  01/21/2021 FINDINGS: The endotracheal tube is in good position at the mid tracheal level. The right IJ catheter and NG tubes are stable. Persistent diffuse interstitial and airspace process in the lungs. No pleural effusions. IMPRESSION: 1. Stable support apparatus. 2. Persistent diffuse interstitial and airspace process. Electronically Signed   By: Marijo Sanes M.D.   On: 01/22/2021 10:00     Medications:     prismasol BGK 4/2.5 500 mL/hr at 01/24/21 0056    prismasol BGK 4/2.5 500 mL/hr at 01/24/21 0057   sodium chloride Stopped (01/21/21 0537)   feeding supplement (VITAL HIGH PROTEIN) 1,000 mL (01/23/21 1140)   fentaNYL infusion INTRAVENOUS 150 mcg/hr (01/24/21 0800)   heparin 1,100 Units/hr (01/24/21 0800)   midazolam 6 mg/hr (01/24/21 0800)   norepinephrine (LEVOPHED) Adult infusion Stopped (01/24/21 0537)   prismasol BGK 4/2.5 2,000 mL/hr at 01/24/21 0456   vancomycin Stopped (01/23/21 2327)   vasopressin 0.02 Units/min (01/24/21 0800)    budesonide (PULMICORT) nebulizer solution  0.5 mg Nebulization BID   chlorhexidine gluconate (MEDLINE  KIT)  15 mL Mouth Rinse BID   Chlorhexidine Gluconate Cloth  6 each Topical Q0600   famotidine  20 mg Per Tube QODAY   feeding supplement (PROSource TF)  45 mL Per Tube BID   free water  30 mL Per Tube Q4H   insulin aspart  0-15 Units Subcutaneous Q4H   ipratropium-albuterol  3 mL  Nebulization Q6H   mouth rinse  15 mL Mouth Rinse 10 times per day   methylPREDNISolone (SOLU-MEDROL) injection  40 mg Intravenous Q12H   multivitamin  1 tablet Per Tube QHS   multivitamin  15 mL Per Tube Daily   mupirocin ointment  1 application Nasal BID   docusate, fentaNYL, heparin sodium (porcine), midazolam, polyethylene glycol, vecuronium  Assessment/ Plan:  61 y.o. female with a PMHx of hypertension, COPD, tobacco abuse, diabetes mellitus type 2, bipolar disorder, depression, who was admitted to [facility] on 01/21/2021 for evaluation of bradycardia and unresponsiveness.  Patient found to have pancreatitis, acute respiratory failure and multiorgan failure.  1.  Acute kidney injury secondary to acute tubular necrosis.  Patient continues to have very little urine output and only 40 cc over the last 24 hours.  We will continue CRRT at this time with current parameters.  Lab Results  Component Value Date   CREATININE 1.30 (H) 01/24/2021   CREATININE 1.48 (H) 01/23/2021   CREATININE 1.51 (H) 01/23/2021   01/11 0701 - 01/12 0700 In: 2516.5 [I.V.:1055.9; NG/GT:953.3; IV Piggyback:507.3] Out: 0177 [Urine:40; Emesis/NG output:30]   2.  Acute respiratory failure.  Continues to require ventilatory support.  Weaning as per pulmonary/critical care.  3.  Metabolic acidosis.  Serum bicarbonate currently 26 and acceptable.  We will continue to monitor.  4.  Hypotension.  Patient currently on vasopressin.  LOS: 4 Len Kluver 1/12/20238:32 AM

## 2021-01-24 NOTE — Progress Notes (Signed)
Per Dr. Mortimer Fries OG tube in place, continue to use. Titrated Versed down to 5 mg/hr from 8 mg /hr. Patients vent alarming high pressures. Respitory in room. Per Dr. Mortimer Fries give vecuronium and do not titrate sedation down from current settings. Continue to assess.

## 2021-01-24 NOTE — Progress Notes (Signed)
ANTICOAGULATION CONSULT NOTE  Pharmacy Consult for heparin Indication:  CRRT clotting issues  No Known Allergies  Patient Measurements: Height: 5' 7.01" (170.2 cm) Weight: 93.9 kg (207 lb 0.2 oz) IBW/kg (Calculated) : 61.62 Heparin Dosing Weight: 81 kg  Vital Signs: Temp: 97.2 F (36.2 C) (01/12 0900) BP: 110/55 (01/12 0900) Pulse Rate: 75 (01/12 0814)  Labs: Recent Labs    01/22/21 0503 01/22/21 1600 01/23/21 0458 01/23/21 1551 01/24/21 0448  HGB 9.7*  --  9.2*  --  8.6*  HCT 28.0*  --  27.4*  --  26.3*  PLT 134*  --  121*  --  107*  HEPARINUNFRC 0.23*  --  0.10*  --  <0.10*  CREATININE 2.60*   < > 1.51* 1.48* 1.30*   < > = values in this interval not displayed.     Estimated Creatinine Clearance: 53.4 mL/min (A) (by C-G formula based on SCr of 1.3 mg/dL (H)).   Medical History: Past Medical History:  Diagnosis Date   Bipolar 1 disorder ([place])    Chronic pain    COPD (chronic obstructive pulmonary disease) ([facility])    Depression    Diabetes mellitus without complication ([place])    Drug abuse ([place])    Hypertension       Assessment: 61 year old female presented with respiratory failure, multifactorial requiring intubation. Creatinine elevated on admission, patient to start on CRRT. Pharmacy consulted for heparin management for CRRT clotting issues.  Goal of Therapy:  Heparin level 0.3-0.5 units/ml or lower if no clotting issues reported Monitor platelets by anticoagulation protocol: Yes    Plan:  --No issues with CRRT circuit clotting at this time --Continue heparin infusion at 1100 units/hr --CBC and HL daily while on heparin  Tawnya Crook, PharmD, BCPS Clinical Pharmacist 01/24/2021 11:14 AM

## 2021-01-24 NOTE — Progress Notes (Signed)
PHARMACY CONSULT NOTE - FOLLOW UP  Pharmacy Consult for Electrolyte Monitoring and Replacement   Recent Labs: Potassium (mmol/L)  Date Value  01/24/2021 4.6  11/04/2011 4.0   Magnesium (mg/dL)  Date Value  01/24/2021 2.5 (H)   Calcium (mg/dL)  Date Value  01/24/2021 7.4 (L)   Calcium, Total (mg/dL)  Date Value  11/04/2011 8.7   Albumin (g/dL)  Date Value  01/24/2021 2.5 (L)  10/15/2020 4.8   Phosphorus (mg/dL)  Date Value  01/24/2021 3.6   Sodium (mmol/L)  Date Value  01/24/2021 135  11/04/2011 141     Assessment: 61yo female admitted 01/21/2021 with respiratory failure, multifactorial. Patient is intubated in the ICU. Currently on CRRT. Pharmacy consulted for electrolyte replacement.   Goal of Therapy:  Electrolytes WNL  Plan:  --No replacement indicated at this time --Follow up with morning labs  Tawnya Crook, PharmD, BCPS Clinical Pharmacist 01/24/2021 11:15 AM

## 2021-01-25 DIAGNOSIS — R4189 Other symptoms and signs involving cognitive functions and awareness: Secondary | ICD-10-CM | POA: Diagnosis not present

## 2021-01-25 LAB — RENAL FUNCTION PANEL
Albumin: 2.6 g/dL — ABNORMAL LOW (ref 3.5–5.0)
Albumin: 2.6 g/dL — ABNORMAL LOW (ref 3.5–5.0)
Anion gap: 7 (ref 5–15)
Anion gap: 5 (ref 5–15)
BUN: 32 mg/dL — ABNORMAL HIGH (ref 8–23)
BUN: 34 mg/dL — ABNORMAL HIGH (ref 8–23)
CO2: 28 mmol/L (ref 22–32)
CO2: 28 mmol/L (ref 22–32)
Calcium: 8.3 mg/dL — ABNORMAL LOW (ref 8.9–10.3)
Calcium: 8.3 mg/dL — ABNORMAL LOW (ref 8.9–10.3)
Chloride: 102 mmol/L (ref 98–111)
Chloride: 103 mmol/L (ref 98–111)
Creatinine, Ser: 1.26 mg/dL — ABNORMAL HIGH (ref 0.44–1.00)
Creatinine, Ser: 1.27 mg/dL — ABNORMAL HIGH (ref 0.44–1.00)
GFR, Estimated: 49 mL/min — ABNORMAL LOW (ref >60)
GFR, Estimated: 48 mL/min — ABNORMAL LOW (ref >60)
Glucose, Bld: 203 mg/dL — ABNORMAL HIGH (ref 70–99)
Glucose, Bld: 214 mg/dL — ABNORMAL HIGH (ref 70–99)
Phosphorus: 2.4 mg/dL — ABNORMAL LOW (ref 2.5–4.6)
Phosphorus: 2.4 mg/dL — ABNORMAL LOW (ref 2.5–4.6)
Potassium: 4.7 mmol/L (ref 3.5–5.1)
Potassium: 4.6 mmol/L (ref 3.5–5.1)
Sodium: 137 mmol/L (ref 135–145)
Sodium: 136 mmol/L (ref 135–145)

## 2021-01-25 LAB — CULTURE, BLOOD (ROUTINE X 2): Culture: NO GROWTH

## 2021-01-25 LAB — GLUCOSE, CAPILLARY
Glucose-Capillary: 156 mg/dL — ABNORMAL HIGH (ref 70–99)
Glucose-Capillary: 175 mg/dL — ABNORMAL HIGH (ref 70–99)
Glucose-Capillary: 180 mg/dL — ABNORMAL HIGH (ref 70–99)
Glucose-Capillary: 192 mg/dL — ABNORMAL HIGH (ref 70–99)
Glucose-Capillary: 206 mg/dL — ABNORMAL HIGH (ref 70–99)
Glucose-Capillary: 216 mg/dL — ABNORMAL HIGH (ref 70–99)

## 2021-01-25 LAB — HEPARIN LEVEL (UNFRACTIONATED): Heparin Unfractionated: <0.1 IU/mL — ABNORMAL LOW (ref 0.30–0.70)

## 2021-01-25 LAB — MAGNESIUM: Magnesium: 2.5 mg/dL — ABNORMAL HIGH (ref 1.7–2.4)

## 2021-01-25 MED ORDER — VITAL AF 1.2 CAL PO LIQD
1000.0000 mL | ORAL | Status: DC
  Administered 2021-01-25 – 2021-01-26 (×2): 1000 mL

## 2021-01-25 MED ORDER — HYDRALAZINE HCL 20 MG/ML IJ SOLN
10.0000 mg | Freq: Once | INTRAMUSCULAR | Status: AC
  Administered 2021-01-25: 10 mg via INTRAVENOUS
  Filled 2021-01-25: qty 1

## 2021-01-25 MED ORDER — PROSOURCE TF PO LIQD
90.0000 mL | Freq: Three times a day (TID) | ORAL | Status: DC
  Administered 2021-01-25 – 2021-01-27 (×6): 90 mL
  Filled 2021-01-25 (×7): qty 90

## 2021-01-25 MED ORDER — PROPOFOL 1000 MG/100ML IV EMUL
5.0000 ug/kg/min | INTRAVENOUS | Status: DC
  Administered 2021-01-25: 10 ug/kg/min via INTRAVENOUS
  Administered 2021-01-26 (×2): 30 ug/kg/min via INTRAVENOUS
  Administered 2021-01-26 (×2): 50 ug/kg/min via INTRAVENOUS
  Administered 2021-01-27: 40 ug/kg/min via INTRAVENOUS
  Administered 2021-01-27: 20 ug/kg/min via INTRAVENOUS
  Administered 2021-01-27: 40 ug/kg/min via INTRAVENOUS
  Filled 2021-01-25 (×6): qty 100
  Filled 2021-01-25: qty 200

## 2021-01-25 NOTE — Progress Notes (Signed)
[facility]  ROUNDING NOTE   Subjective:  Patient seen and evaluated in the critical care unit. Still critically ill. Very little UOP.  Continues to tolerate CRRT well.   Currently off of pressors. Still on mechanical ventilation.   Objective:  Vital signs in last 24 hours:  Temp:  [96.3 F (35.7 C)-98.1 F (36.7 C)] 96.3 F (35.7 C) (01/13 0900) Pulse Rate:  [63-78] 78 (01/13 0900) Resp:  [17-28] 28 (01/13 0900) BP: (107-156)/(46-69) 152/69 (01/13 0900) SpO2:  [94 %-99 %] 94 % (01/13 0900) Arterial Line BP: (124-196)/(47-77) 176/70 (01/13 0900) FiO2 (%):  [40 %-50 %] 40 % (01/13 0900) Weight:  [89.8 kg] 89.8 kg (01/13 0417)  Weight change: -4.1 kg Filed Weights   01/23/21 0500 01/24/21 0409 01/25/21 0417  Weight: 94.5 kg 93.9 kg 89.8 kg    Intake/Output: I/O last 3 completed shifts: In: 3998.6 [I.V.:1299.4; NG/GT:2170; IV Piggyback:529.2] Out: 4287 [Urine:30; Other:5522]   Intake/Output this shift:  Total I/O In: 184.2 [I.V.:74.2; NG/GT:110] Out: 299 [Other:299]  Physical Exam: General: Critically ill-appearing  Head: Normocephalic, atraumatic.  Endotracheal tube in place  Eyes: Anicteric  Neck: Supple  Lungs:  Scattered rhonchi, vent assisted  Heart: S1S2 no rubs  Abdomen:  Distention noted, scant bowel sounds  Extremities: Trace peripheral edema.  Neurologic: Intubated, sedated  Skin: No acute rash  Access: Left IJ temporary dialysis catheter    Basic Metabolic Panel: Recent Labs  Lab 01/21/21 0456 01/21/21 1545 01/22/21 0503 01/22/21 1600 01/23/21 0458 01/23/21 1551 01/24/21 0448 01/24/21 1519 01/25/21 0510  NA 132*   < > 134*   < > 135 137 135 138 137  K 4.3   < > 4.0   < > 4.3 4.3 4.6 4.7 4.7  CL 100   < > 99   < > 100 101 105 102 102  CO2 18*   < > 25   < > '29 29 26 29 28  ' GLUCOSE 188*   < > 104*   < > 130* 128* 166* 188* 203*  BUN 77*   < > 36*   < > '18 17 19 ' 26* 32*  CREATININE 4.80*   < > 2.60*   < > 1.51* 1.48* 1.30*  1.38* 1.26*  CALCIUM 6.1*   < > 6.8*   < > 7.4* 7.7* 7.4* 8.1* 8.3*  MG 1.9  --  2.2  --  2.6*  --  2.5*  --  2.5*  PHOS 10.0*   < > 3.7   < > 2.4* 2.9 3.6 3.2 2.4*   < > = values in this interval not displayed.     Liver Function Tests: Recent Labs  Lab 02/08/2021 1820 01/21/21 0456 01/21/21 1545 01/23/21 0458 01/23/21 1551 01/24/21 0448 01/24/21 1519 01/25/21 0510  AST 84* 166*  --   --   --   --   --   --   ALT 41 69*  --   --   --   --   --   --   ALKPHOS 72 80  --   --   --   --   --   --   BILITOT 0.7 1.1  --   --   --   --   --   --   PROT 5.5* 5.8*  --   --   --   --   --   --   ALBUMIN 2.7* 2.9*   < > 2.4* 2.5* 2.5* 2.4* 2.6*   < > =  values in this interval not displayed.    Recent Labs  Lab 01/14/2021 1820 01/31/2021 2308 01/21/21 0456  LIPASE 86*  --  54*  AMYLASE  --  267*  --     Recent Labs  Lab 01/21/21 0456  AMMONIA 42*     CBC: Recent Labs  Lab 02/11/2021 1820 01/21/21 0435 01/22/21 0503 01/23/21 0458 01/24/21 0448  WBC 13.6* 3.8* 5.1 7.3 8.9  NEUTROABS 11.6*  --   --   --   --   HGB 10.6* 11.2* 9.7* 9.2* 8.6*  HCT 33.3* 33.1* 28.0* 27.4* 26.3*  MCV 92.2 89.9 86.2 89.0 90.4  PLT 251 265 134* 121* 107*     Cardiac Enzymes: No results for input(s): CKTOTAL, CKMB, CKMBINDEX, TROPONINI in the last 168 hours.  BNP: Invalid input(s): POCBNP  CBG: Recent Labs  Lab 01/24/21 1509 01/24/21 1908 01/24/21 2319 01/25/21 0320 01/25/21 0717  GLUCAP 161* 169* 218* 180* 175*     Microbiology: Results for orders placed or performed during the hospital encounter of 01/29/2021  Resp Panel by RT-PCR (Flu A&B, Covid) Nasopharyngeal Swab     Status: None   Collection Time: 01/17/2021  6:20 PM   Specimen: Nasopharyngeal Swab; Nasopharyngeal(NP) swabs in vial transport medium  Result Value Ref Range Status   SARS Coronavirus 2 by RT PCR NEGATIVE NEGATIVE Final    Comment: (NOTE) SARS-CoV-2 target nucleic acids are NOT DETECTED.  The SARS-CoV-2 RNA  is generally detectable in upper respiratory specimens during the acute phase of infection. The lowest concentration of SARS-CoV-2 viral copies this assay can detect is 138 copies/mL. A negative result does not preclude SARS-Cov-2 infection and should not be used as the sole basis for treatment or other patient management decisions. A negative result may occur with  improper specimen collection/handling, submission of specimen other than nasopharyngeal swab, presence of viral mutation(s) within the areas targeted by this assay, and inadequate number of viral copies(<138 copies/mL). A negative result must be combined with clinical observations, patient history, and epidemiological information. The expected result is Negative.  Fact Sheet for Patients:  EntrepreneurPulse.com.au  Fact Sheet for Healthcare Providers:  IncredibleEmployment.be  This test is no t yet approved or cleared by the [place] FDA and  has been authorized for detection and/or diagnosis of SARS-CoV-2 by FDA under an Emergency Use Authorization (EUA). This EUA will remain  in effect (meaning this test can be used) for the duration of the COVID-19 declaration under Section 564(b)(1) of the Act, 21 U.S.C.section 360bbb-3(b)(1), unless the authorization is terminated  or revoked sooner.       Influenza A by PCR NEGATIVE NEGATIVE Final   Influenza B by PCR NEGATIVE NEGATIVE Final    Comment: (NOTE) The Xpert Xpress SARS-CoV-2/FLU/RSV plus assay is intended as an aid in the diagnosis of influenza from Nasopharyngeal swab specimens and should not be used as a sole basis for treatment. Nasal washings and aspirates are unacceptable for Xpert Xpress SARS-CoV-2/FLU/RSV testing.  Fact Sheet for Patients: EntrepreneurPulse.com.au  Fact Sheet for Healthcare Providers: IncredibleEmployment.be  This test is not yet approved or cleared by the  [place] FDA and has been authorized for detection and/or diagnosis of SARS-CoV-2 by FDA under an Emergency Use Authorization (EUA). This EUA will remain in effect (meaning this test can be used) for the duration of the COVID-19 declaration under Section 564(b)(1) of the Act, 21 U.S.C. section 360bbb-3(b)(1), unless the authorization is terminated or revoked.  Performed at [facility], [place]  [address] [place], [place] [address]   Urine Culture     Status: None   Collection Time: 02/05/2021  6:22 PM   Specimen: Urine, Random  Result Value Ref Range Status   Specimen Description   Final    URINE, RANDOM Performed at [facility], [address], [place], [place] [address]    Special Requests   Final    NONE Performed at [facility], [address] [place], [place] [address]    Culture   Final    NO GROWTH Performed at [facility], [place] [address] [place], [place] [address]    Report Status 01/22/2021 FINAL  Final  Blood Culture (routine x 2)     Status: None   Collection Time: 01/19/2021  6:23 PM   Specimen: Left Antecubital; Blood  Result Value Ref Range Status   Specimen Description LEFT ANTECUBITAL  Final   Special Requests   Final    BOTTLES DRAWN AEROBIC AND ANAEROBIC Blood Culture results may not be optimal due to an inadequate volume of blood received in culture bottles   Culture   Final    NO GROWTH 5 DAYS Performed at [facility], [address], [place], [place] [address]    Report Status 01/25/2021 FINAL  Final  MRSA Next Gen by PCR, Nasal     Status: Abnormal   Collection Time: 02/09/2021  9:59 PM   Specimen: Nasal Mucosa; Nasal Swab  Result Value Ref Range Status   MRSA by PCR Next Gen DETECTED (A) NOT DETECTED Final    Comment: RESULT CALLED TO, READ BACK BY AND VERIFIED WITH: '@[id number]'  Melissa Cobb 02/01/2021 (NOTE) The GeneXpert MRSA Assay (FDA approved for NASAL specimens only), is one component of a  comprehensive MRSA colonization surveillance program. It is not intended to diagnose MRSA infection nor to guide or monitor treatment for MRSA infections. Test performance is not FDA approved in patients less than 25 years old. Performed at [facility], [address] [place], [place] [address]   Culture, Respiratory w Gram Stain     Status: None   Collection Time: 01/21/21  8:35 AM   Specimen: Tracheal Aspirate; Respiratory  Result Value Ref Range Status   Specimen Description   Final    TRACHEAL ASPIRATE Performed at [facility], [address] [place], [place] [address]    Special Requests   Final    NONE Performed at [facility], [address] [place], [place] [address]    Gram Stain   Final    FEW WBC PRESENT, PREDOMINANTLY MONONUCLEAR FEW YEAST WITH PSEUDOHYPHAE RARE GRAM POSITIVE COCCI IN PAIRS Performed at [facility], [place] [address] [place], [place] [address]    Culture   Final    ABUNDANT METHICILLIN RESISTANT STAPHYLOCOCCUS AUREUS ABUNDANT CANDIDA ALBICANS    Report Status 01/24/2021 FINAL  Final   Organism ID, Bacteria METHICILLIN RESISTANT STAPHYLOCOCCUS AUREUS  Final      Susceptibility   Methicillin resistant staphylococcus aureus - MIC*    CIPROFLOXACIN >=8 RESISTANT Resistant     ERYTHROMYCIN >=8 RESISTANT Resistant     GENTAMICIN <=0.5 SENSITIVE Sensitive     OXACILLIN >=4 RESISTANT Resistant     TETRACYCLINE <=1 SENSITIVE Sensitive     VANCOMYCIN 1 SENSITIVE Sensitive     TRIMETH/SULFA >=320 RESISTANT Resistant     CLINDAMYCIN <=0.25 SENSITIVE Sensitive     RIFAMPIN <=0.5 SENSITIVE Sensitive     Inducible Clindamycin NEGATIVE  Sensitive     * ABUNDANT METHICILLIN RESISTANT STAPHYLOCOCCUS AUREUS  Culture, blood (Routine X 2) w Reflex to ID Panel     Status: None (Preliminary result)   Collection Time: 01/21/21 11:10 PM   Specimen: BLOOD  Result Value Ref Range Status   Specimen Description BLOOD LEFT HAND   Final   Special Requests   Final    BOTTLES DRAWN AEROBIC AND ANAEROBIC Blood Culture adequate volume   Culture   Final    NO GROWTH 4 DAYS Performed at [facility], [address] [place], [place] [address]    Report Status PENDING  Incomplete    Coagulation Studies: No results for input(s): LABPROT, INR in the last 72 hours.   Urinalysis: No results for input(s): COLORURINE, LABSPEC, PHURINE, GLUCOSEU, HGBUR, BILIRUBINUR, KETONESUR, PROTEINUR, UROBILINOGEN, NITRITE, LEUKOCYTESUR in the last 72 hours.  Invalid input(s): APPERANCEUR     Imaging: DG Chest 1 View  Result Date: 01/24/2021 CLINICAL DATA:  61 year old female with history of shortness of breath. EXAM: CHEST  1 VIEW COMPARISON:  Chest x-ray 01/23/2021. FINDINGS: An endotracheal tube is in place with tip 4.8 cm above the carina. There is a right-sided internal jugular central venous catheter with tip terminating in the mid superior vena cava. Nasogastric tube has been partially withdrawn, with tip in the mid esophagus. Lung volumes are low. Diffuse peribronchial cuffing, widespread interstitial opacities and ill-defined opacities are again scattered throughout the lungs bilaterally, overall slightly improved aeration compared to the prior examination. No pleural effusions. Pulmonary vasculature does not appearing origin. Heart size is normal. Upper mediastinal contours are within normal limits. Atherosclerotic calcifications are noted in the thoracic aorta. IMPRESSION: 1. Support apparatus, as above. Please take note of the high position of the nasogastric tube which is now in the mid esophagus, and consider advancement approximally 20 cm for more optimal placement. 2. The appearance the chest suggests resolving multilobar bilateral bronchopneumonia, as above. 3. Aortic atherosclerosis. No active disease. Electronically Signed   By: Vinnie Langton M.D.   On: 01/24/2021 07:25   DG Abd 1 View  Result Date:  01/24/2021 CLINICAL DATA:  Encounter for gastric tube placement EXAM: ABDOMEN - 1 VIEW COMPARISON:  None. FINDINGS: Orogastric tube with tip in the gastric antrum. Side port below the GE junction. IMPRESSION: Orogastric tube with  tip in the distal stomach Electronically Signed   By: Suzy Bouchard M.D.   On: 01/24/2021 10:46   DG Abd 1 View  Result Date: 01/23/2021 CLINICAL DATA:  Abdominal distension. EXAM: ABDOMEN - 1 VIEW COMPARISON:  View abdomen 01/21/21 and one-view abdomen 02/12/2021. FINDINGS: Side port of the NG tube is in the stomach. Left IJ femoral line is stable. Bowel gas pattern is unremarkable. No obstruction or free air is present. Axial skeleton is within limits. Interstitial edema or airspace disease noted at right base. IMPRESSION: 1. Side port of the NG tube is in the stomach. 2. No acute abnormality. Electronically Signed   By: San Morelle M.D.   On: 01/23/2021 14:12   DG CHEST PORT 1 VIEW  Result Date: 01/23/2021 CLINICAL DATA:  Hypoxia. EXAM: PORTABLE CHEST 1 VIEW COMPARISON:  01/22/2021 FINDINGS: Endotracheal tube is near the carina and similar to the previous examination. Nasogastric tube extends into the abdomen. No significant change in the patchy bilateral airspace disease. There is a right jugular dialysis catheter with the tip in the lower SVC. Heart size is within normal limits and stable. Negative for pneumothorax. IMPRESSION: 1. No  change in the bilateral airspace densities. 2. Endotracheal tube is stable in position near the carina. Electronically Signed   By: Markus Daft M.D.   On: 01/23/2021 14:13     Medications:     prismasol BGK 4/2.5 500 mL/hr at 01/25/21 0251    prismasol BGK 4/2.5 500 mL/hr at 01/25/21 0250   sodium chloride 250 mL (01/24/21 2214)   feeding supplement (VITAL HIGH PROTEIN) 1,000 mL (01/25/21 0737)   fentaNYL infusion INTRAVENOUS 150 mcg/hr (01/25/21 0900)   heparin 1,100 Units/hr (01/25/21 0900)   midazolam 8 mg/hr (01/25/21 0936)    norepinephrine (LEVOPHED) Adult infusion Stopped (01/24/21 2214)   prismasol BGK 4/2.5 2,000 mL/hr at 01/25/21 0747   vancomycin Stopped (01/25/21 0003)   vasopressin Stopped (01/24/21 1030)    budesonide (PULMICORT) nebulizer solution  0.5 mg Nebulization BID   chlorhexidine gluconate (MEDLINE KIT)  15 mL Mouth Rinse BID   Chlorhexidine Gluconate Cloth  6 each Topical Q0600   famotidine  20 mg Per Tube QODAY   feeding supplement (PROSource TF)  45 mL Per Tube BID   free water  30 mL Per Tube Q4H   insulin aspart  0-15 Units Subcutaneous Q4H   ipratropium-albuterol  3 mL Nebulization TID   mouth rinse  15 mL Mouth Rinse 10 times per day   methylPREDNISolone (SOLU-MEDROL) injection  40 mg Intravenous Q12H   multivitamin  1 tablet Per Tube QHS   multivitamin  15 mL Per Tube Daily   docusate, fentaNYL, heparin sodium (porcine), midazolam, polyethylene glycol, vecuronium  Assessment/ Plan:  61 y.o. female with a PMHx of hypertension, COPD, tobacco abuse, diabetes mellitus type 2, bipolar disorder, depression, who was admitted to [facility] on 01/29/2021 for evaluation of bradycardia and unresponsiveness.  Patient found to have pancreatitis, acute respiratory failure, ARDS and multiorgan failure.  1.  Acute kidney injury secondary to acute tubular necrosis.  Patient continues to have very little urine output.  Serum electrolytes acceptable.  Serum potassium acceptable at 47 and serum bicarbonate 28.  Continue CRRT for 1 additional day.  Could potentially consider transitioning the patient to intermittent hemodialysis over the weekend.  Lab Results  Component Value Date   CREATININE 1.26 (H) 01/25/2021   CREATININE 1.38 (H) 01/24/2021   CREATININE 1.30 (H) 01/24/2021   01/12 0701 - 01/13 0700 In: 2560.7 [I.V.:780.9; NG/GT:1530; IV Piggyback:249.8] Out: 8416 [Urine:10]   2.  Acute respiratory failure.  Continues to require mechanical ventilation.  FiO2 down to 40%.  Weaning as per  pulmonary/critical care.  3.  Metabolic acidosis.  Serum bicarbonate much improved to 28.  Continue CRRT to regulate acid/base status.  4.  Hypotension.  Patient now off of pressors.   LOS: 5 Peg Fifer 1/13/202310:18 AM

## 2021-01-25 NOTE — Progress Notes (Signed)
Pharmacy Antibiotic Note  Victoria Frey is a 61 y.o. female admitted on 01/23/2021 with sepsis.  Pharmacy has been consulted for vancomycin dosing. Patient with AKI started on CRRT. Tracheal aspirate with staph aureus, pending sensitivities. MRSA PCR positive.  Plan: --Continue vancomycin 1250 mg IV q24h (for CRRT) --Plan for last day of therapy 1/15 --If CRRT discontinued over the weekend, will adjust dose accordingly  Height: 5' 7.01" (170.2 cm) Weight: 89.8 kg (197 lb 15.6 oz) IBW/kg (Calculated) : 61.62  Temp (24hrs), Avg:97.1 F (36.2 C), Min:95.4 F (35.2 C), Max:98.1 F (36.7 C)  Recent Labs  Lab 02/01/2021 1820 02/01/2021 2308 01/21/21 0435 01/21/21 0456 01/22/21 0503 01/22/21 1600 01/23/21 0458 01/23/21 1551 01/23/21 2025 01/24/21 0448 01/24/21 1519 01/25/21 0510  WBC 13.6*  --  3.8*  --  5.1  --  7.3  --   --  8.9  --   --   CREATININE 5.44*  --   --    < > 2.60*   < > 1.51* 1.48*  --  1.30* 1.38* 1.26*  LATICACIDVEN 4.1* 1.7  --   --   --   --   --   --   --   --   --   --   VANCOTROUGH  --   --   --   --   --   --   --   --  11*  --   --   --    < > = values in this interval not displayed.     Estimated Creatinine Clearance: 54 mL/min (A) (by C-G formula based on SCr of 1.26 mg/dL (H)).    No Known Allergies  Antimicrobials this admission: Vancomycin 1/8 >>  Zosyn 1/8 >> 1/11  Microbiology results: 1/9 Resp Cx: MRSA 1/8 BCx: NG 1/8 UCx: NG 1/8 MRSA PCR: positive  Thank you for allowing pharmacy to be a part of this patients care.  Tawnya Crook, PharmD, BCPS Clinical Pharmacist 01/25/2021 11:09 AM

## 2021-01-25 NOTE — Progress Notes (Signed)
PHARMACY CONSULT NOTE - FOLLOW UP  Pharmacy Consult for Electrolyte Monitoring and Replacement   Recent Labs: Potassium (mmol/L)  Date Value  01/25/2021 4.7  11/04/2011 4.0   Magnesium (mg/dL)  Date Value  01/25/2021 2.5 (H)   Calcium (mg/dL)  Date Value  01/25/2021 8.3 (L)   Calcium, Total (mg/dL)  Date Value  11/04/2011 8.7   Albumin (g/dL)  Date Value  01/25/2021 2.6 (L)  10/15/2020 4.8   Phosphorus (mg/dL)  Date Value  01/25/2021 2.4 (L)   Sodium (mmol/L)  Date Value  01/25/2021 137  11/04/2011 141     Assessment: 61yo female admitted 01/16/2021 with respiratory failure, multifactorial. Patient is intubated in the ICU. Currently on CRRT. Pharmacy consulted for electrolyte replacement.   Goal of Therapy:  Electrolytes WNL  Plan:  --Phos slightly low, will defer replacement as possible plan to transition CRRT to HD over the weekend. Expect phos will start to trend up once off CRRT. --If phos with significant further trend downward or plan for CRRT changes, will replace PRN --Follow up with morning labs  Tawnya Crook, PharmD, BCPS Clinical Pharmacist 01/25/2021 11:01 AM

## 2021-01-25 NOTE — Progress Notes (Signed)
Nutrition Follow-up  DOCUMENTATION CODES:   Obesity unspecified  INTERVENTION:   -D/c liquid MVI -D/c Vital High Protein -Initiate Vital AF 1.2 @ 25 ml/hr via OGT and increase by 10 ml every 4 hours to goal rate of 35 ml/hr.   90 ml Prosource Plus TID  30 ml free water flush every 4 hours for tube patency    Tube feeding regimen provides 1248 kcal (100% of needs), 129 grams of protein, and 681 ml of H2O.  Total free water: 861 ml daily  NUTRITION DIAGNOSIS:   Inadequate oral intake related to inability to eat (pt sedated and ventilated) as evidenced by NPO status.  Ongoing  GOAL:   Provide needs based on ASPEN/SCCM guidelines  Met with TF  MONITOR:   Vent status, Labs, Weight trends, Skin, I & O's  REASON FOR ASSESSMENT:   Consult Enteral/tube feeding initiation and management  ASSESSMENT:   61 y/o female with h/o bipolar disorder, substance abuse, depression, HTN, COPD, DM and Schatzki's ring s/p esophageal dilation who is admitted with AKI, sepsis, possible pancreatitis and aspiration PNA.  1/10- trickle feeds initiated 1/12- OGT advanced  Patient is currently intubated on ventilator support MV: 12 L/min Temp (24hrs), Avg:97.1 F (36.2 C), Min:95.4 F (35.2 C), Max:98.1 F (36.7 C)  MAP: 71  Propofol d/c this afternoon.  Discussed with RN, MD, and during ICU rounds. Pt had BM yesterday.   Pt remains on CRRT.   RD contacted by RN regarding OGT residual of 650 ml. RD to re-evaluate TF regimen.    Medications reviewed and include solu-medrol, versed, fentanyl, and vasopressin.   Labs reviewed: CBGS: 130-865 (inpatient orders for glycemic control are 0-15 units insulin aspart every 4 hours).    Diet Order:   Diet Order             Diet NPO time specified  Diet effective now                   EDUCATION NEEDS:   No education needs have been identified at this time  Skin:  Skin Assessment: Reviewed RN Assessment  Last BM:   01/24/21  Height:   Ht Readings from Last 1 Encounters:  01/24/21 5' 7.01" (1.702 m)    Weight:   Wt Readings from Last 1 Encounters:  01/25/21 89.8 kg    Ideal Body Weight:  61.36 kg  BMI:  Body mass index is 31 kg/m.  Estimated Nutritional Needs:   Kcal:  784-6962  Protein:  > 123 grams  Fluid:  1.9-2.2L/day    Loistine Chance, RD, LDN, [place] Registered Dietitian II Certified Diabetes Care and Education Specialist Please refer to [facility] for RD and/or RD on-call/weekend/after hours pager

## 2021-01-25 NOTE — Progress Notes (Signed)
ANTICOAGULATION CONSULT NOTE  Pharmacy Consult for heparin Indication:  CRRT clotting issues  No Known Allergies  Patient Measurements: Height: 5' 7.01" (170.2 cm) Weight: 89.8 kg (197 lb 15.6 oz) IBW/kg (Calculated) : 61.62 Heparin Dosing Weight: 81 kg  Vital Signs: Temp: 95.5 F (35.3 C) (01/13 1000) Temp Source: Bladder (01/13 1000) BP: 124/56 (01/13 1000) Pulse Rate: 63 (01/13 1000)  Labs: Recent Labs    01/23/21 0458 01/23/21 1551 01/24/21 0448 01/24/21 1519 01/25/21 0510  HGB 9.2*  --  8.6*  --   --   HCT 27.4*  --  26.3*  --   --   PLT 121*  --  107*  --   --   HEPARINUNFRC 0.10*  --  <0.10*  --  <0.10*  CREATININE 1.51*   < > 1.30* 1.38* 1.26*   < > = values in this interval not displayed.     Estimated Creatinine Clearance: 54 mL/min (A) (by C-G formula based on SCr of 1.26 mg/dL (H)).   Medical History: Past Medical History:  Diagnosis Date   Bipolar 1 disorder ([place])    Chronic pain    COPD (chronic obstructive pulmonary disease) ([facility])    Depression    Diabetes mellitus without complication ([place])    Drug abuse ([place])    Hypertension       Assessment: 61 year old female presented with respiratory failure, multifactorial requiring intubation. Creatinine elevated on admission, patient to start on CRRT. Pharmacy consulted for heparin management for CRRT clotting issues.  Goal of Therapy:  Heparin level 0.3-0.5 units/ml or lower if no clotting issues reported Monitor platelets by anticoagulation protocol: Yes    Plan:  --No issues with CRRT circuit clotting at this time --Continue heparin infusion at 1100 units/hr --CBC and HL daily while on heparin  Tawnya Crook, PharmD, BCPS Clinical Pharmacist 01/25/2021 11:00 AM

## 2021-01-25 NOTE — Progress Notes (Addendum)
NAME:  Victoria Frey, MRN:  [id number], DOB:  1960-04-29, LOS: 5 ADMISSION DATE:  01/13/2021  History of Present Illness:  61 yo F with LKW Saturday 01/19/21, when patient complained to a family member she didn't feel well and was having trouble going to the bathroom. EMS called by family 02/12/2021 due to confusion & slurred speech. Per ED report on EMS arrival patient appeared confused and stumbling. Initially, patient was able to walk with assistance to the ambulance, but during transport she became unconscious, bradycardic in the 30's and hypotensive. Patient TC paced and intubated in the field, pt received 500 mL bolus. ED course: Workup completed per Sepsis protocol. Medications given: 3 L NS bolus, Levophed drip started, cefepime/flagyl/vancomycin Initial Vitals: hypothermic 83.5, RR- 14, initially hypertensive 237/73 then subsequently hypotensive 46/32, SpO2 99% on 60 % FiO2 with mechanical ventilatory support Significant labs: (Labs/ Imaging personally reviewed) I, Domingo Pulse [place], AGACNP-BC, personally viewed and interpreted this ECG. Chemistry: mild hyponatremia Na+:133, K+: 3.9, BUN/Cr.: 75/ 5.44, Serum CO2/ AG: 11/18, Alk phos: 72, albumin: 2.7, lipase: 86, AST/ALT: 84/41 Hematology: leukocytosis WBC: 13.6, Hgb: 10.6,  Lactic: 4.1, COVID-19 & Influenza A/B: negative ABG: 6.94/ 46/ 116/ 9.9 CXR 01/19/2021: Diffuse bilateral airspace disease, favor edema/CHF CT chest wo contrast 02/10/2021: Patchy ground-glass densities and consolidation throughout both lungs, concerning for multifocal pneumonia. Alternatively, this could reflect ARDS in the setting of pancreatitis. Trace bilateral pleural effusions in the setting of emphysema.  CT Abdomen and pelvis wo contrast 01/18/2021: Acute pancreatitis.  No peripancreatic fluid collection. Bilateral nonobstructive nephrolithiasis. Goessel wo contrast 02/02/2021: no acute intracranial abnormality   PCCM consulted for admission due to acute respiratory failure  and multi-organ failure requiring vasopressors and mechanical ventilatory support.   Pertinent  Medical History  Bipolar disorder 1 & depression HTN COPD not on chronic O2 Current smoker T2DM Polysubstance abuse Significant Hospital Events: Including procedures, antibiotic start and stop dates in addition to other pertinent events   01/19/2021: Admit to ICU with acute respiratory failure requiring mechanical ventilatory support in the setting of acute pancreatitis & circulatory shock on vasopressor support 1/9 severe ARDS, VASC cath placed, CRRT started 1/10 remains critically ill, started TF's 1/11 remains on CRRT, VENT, FEEDS ON HOLD 1/12 increased WOB, on CRRT, on VENT 1/13 remains intubated, MRSA pneumonia   Interim History / Subjective:  Severe resp failure Remains on vent Remains critically ill On feeds +COPD exacerbation Progressive renal failure On CRRT   Micro Data:  COVID and FLU NEG MRSA PNEUMONIA  Antimicrobials:   Antibiotics Given (last 72 hours)     Date/Time Action Medication Dose Rate   01/22/21 1208 New Bag/Given   piperacillin-tazobactam (ZOSYN) IVPB 3.375 g 3.375 g 100 mL/hr   01/22/21 1806 New Bag/Given   piperacillin-tazobactam (ZOSYN) IVPB 3.375 g 3.375 g 100 mL/hr   01/22/21 2134 New Bag/Given   vancomycin (VANCOCIN) IVPB 1000 mg/200 mL premix 1,000 mg 200 mL/hr   01/22/21 2356 New Bag/Given   piperacillin-tazobactam (ZOSYN) IVPB 3.375 g 3.375 g 100 mL/hr   01/23/21 0554 New Bag/Given   piperacillin-tazobactam (ZOSYN) IVPB 3.375 g 3.375 g 100 mL/hr   01/23/21 2155 New Bag/Given   vancomycin (VANCOREADY) IVPB 1250 mg/250 mL 1,250 mg 166.7 mL/hr   01/24/21 2233 New Bag/Given   vancomycin (VANCOREADY) IVPB 1250 mg/250 mL 1,250 mg 166.7 mL/hr          Objective   Blood pressure (!) 156/65, pulse 77, temperature (!) 97 F (36.1 C), temperature source Bladder,  resp. rate (!) 24, height 5' 7.01" (1.702 m), weight 89.8 kg, SpO2 96 %.    Vent  Mode: PRVC FiO2 (%):  [40 %-50 %] 40 % Set Rate:  [24 bmp] 24 bmp Vt Set:  [500 mL] 500 mL PEEP:  [10 cmH20] 10 cmH20 Plateau Pressure:  [20 cmH20-22 cmH20] 22 cmH20   Intake/Output Summary (Last 24 hours) at 01/25/2021 0851 Last data filed at 01/25/2021 0800 Gross per 24 hour  Intake 2561.26 ml  Output 3591 ml  Net -1029.74 ml    Filed Weights   01/23/21 0500 01/24/21 0409 01/25/21 0417  Weight: 94.5 kg 93.9 kg 89.8 kg    REVIEW OF SYSTEMS  PATIENT IS UNABLE TO PROVIDE COMPLETE REVIEW OF SYSTEMS DUE TO SEVERE CRITICAL ILLNESS AND TOXIC METABOLIC ENCEPHALOPATHY    PHYSICAL EXAMINATION:  GENERAL:critically ill appearing, +resp distress EYES: Pupils equal, round, reactive to light.  No scleral icterus.  MOUTH: Moist mucosal membrane. INTUBATED NECK: Supple.  PULMONARY: +rhonchi, +wheezing CARDIOVASCULAR: S1 and S2.  No murmurs  GASTROINTESTINAL: Soft, nontender, -distended. Positive bowel sounds.  MUSCULOSKELETAL: No swelling, clubbing, or edema.  NEUROLOGIC: obtunded SKIN:intact,warm,dry    Labs/imaging that I havepersonally reviewed  (right click and "Reselect all SmartList Selections" daily)      ASSESSMENT AND PLAN SYNOPSIS  Severe ACUTE Hypoxic and Hypercapnic Respiratory Failure with septic shock on admission with severe pancreatitis and ARDS with progressive with MRSA PNEUMONIA  Severe ACUTE Hypoxic and Hypercapnic Respiratory Failure -continue Mechanical Ventilator support -Wean Fio2 and PEEP as tolerated -VAP/VENT bundle implementation - Wean PEEP & FiO2 as tolerated, maintain SpO2 > 88% - Head of bed elevated 30 degrees, VAP protocol in place - Plateau pressures less than 30 cm H20  - Intermittent chest x-ray & ABG PRN - Ensure adequate pulmonary hygiene  -will NOT perform SAT/SBT when respiratory parameters are met Vent Mode: PRVC FiO2 (%):  [40 %-50 %] 40 % Set Rate:  [24 bmp] 24 bmp Vt Set:  [500 mL] 500 mL PEEP:  [10 cmH20] 10 cmH20 Plateau  Pressure:  [20 cmH20-22 cmH20] 22 cmH20    SEVERE COPD EXACERBATION MRSA PNEUMONIA -continue IV steroids as prescribed -continue NEB THERAPY as prescribed -morphine as needed -wean fio2 as needed and tolerated   CARDIAC ICU monitoring  ACUTE KIDNEY INJURY/Renal Failure -continue Foley Catheter-assess need -Avoid nephrotoxic agents -Follow urine output, BMP -Ensure adequate renal perfusion, optimize oxygenation -Renal dose medications   Intake/Output Summary (Last 24 hours) at 01/25/2021 0854 Last data filed at 01/25/2021 0800 Gross per 24 hour  Intake 2561.26 ml  Output 3591 ml  Net -1029.74 ml     NEUROLOGY ACUTE TOXIC METABOLIC ENCEPHALOPATHY -need for sedation -Goal RASS -2 to -3  SEPTIC shock SOURCE-pancreatitis -use vasopressors to keep MAP>65 as needed -follow ABG and LA as needed -follow up cultures   ENDO - ICU hypoglycemic\Hyperglycemia protocol -check FSBS per protocol   GI GI PROPHYLAXIS as indicated  NUTRITIONAL STATUS DIET-->TF's as tolerated Constipation protocol as indicated   ELECTROLYTES -follow labs as needed -replace as needed -pharmacy consultation and following  Best Practice (right click and "Reselect all SmartList Selections" daily)  Diet/type: NPO w/ meds via tube DVT prophylaxis: prophylactic heparin  GI prophylaxis: H2B Lines: N/A, will be placing arterial & CVC Foley:  Yes, and it is still needed Code Status:  full code    Labs   CBC: Recent Labs  Lab 02/05/2021 1820 01/21/21 0435 01/22/21 0503 01/23/21 0458 01/24/21 0448  WBC 13.6* 3.8* 5.1  7.3 8.9  NEUTROABS 11.6*  --   --   --   --   HGB 10.6* 11.2* 9.7* 9.2* 8.6*  HCT 33.3* 33.1* 28.0* 27.4* 26.3*  MCV 92.2 89.9 86.2 89.0 90.4  PLT 251 265 134* 121* 107*     Basic Metabolic Panel: Recent Labs  Lab 01/21/21 0456 01/21/21 1545 01/22/21 0503 01/22/21 1600 01/23/21 0458 01/23/21 1551 01/24/21 0448 01/24/21 1519 01/25/21 0510  NA 132*   < > 134*    < > 135 137 135 138 137  K 4.3   < > 4.0   < > 4.3 4.3 4.6 4.7 4.7  CL 100   < > 99   < > 100 101 105 102 102  CO2 18*   < > 25   < > _0 GLUCOSE 188*   < > 104*   < > 130* 128* 166* 188* 203*  BUN 77*   < > 36*   < > _1 26* 32*  CREATININE 4.80*   < > 2.60*   < > 1.51* 1.48* 1.30* 1.38* 1.26*  CALCIUM 6.1*   < > 6.8*   < > 7.4* 7.7* 7.4* 8.1* 8.3*  MG 1.9  --  2.2  --  2.6*  --  2.5*  --  2.5*  PHOS 10.0*   < > 3.7   < > 2.4* 2.9 3.6 3.2 2.4*   < > = values in this interval not displayed.    GFR: Estimated Creatinine Clearance: 54 mL/min (A) (by C-G formula based on SCr of 1.26 mg/dL (H)). Recent Labs  Lab 02/07/2021 1820 01/17/2021 2308 01/21/21 0435 01/21/21 0456 01/22/21 0503 01/23/21 0458 01/24/21 0448  PROCALCITON  --   --   --  4.15 4.18 2.90  --   WBC 13.6*  --  3.8*  --  5.1 7.3 8.9  LATICACIDVEN 4.1* 1.7  --   --   --   --   --      Liver Function Tests: Recent Labs  Lab 01/19/2021 1820 01/21/21 0456 01/21/21 1545 01/23/21 0458 01/23/21 1551 01/24/21 0448 01/24/21 1519 01/25/21 0510  AST 84* 166*  --   --   --   --   --   --   ALT 41 69*  --   --   --   --   --   --   ALKPHOS 72 80  --   --   --   --   --   --   BILITOT 0.7 1.1  --   --   --   --   --   --   PROT 5.5* 5.8*  --   --   --   --   --   --   ALBUMIN 2.7* 2.9*   < > 2.4* 2.5* 2.5* 2.4* 2.6*   < > = values in this interval not displayed.    Recent Labs  Lab 01/14/2021 1820 02/08/2021 2308 01/21/21 0456  LIPASE 86*  --  54*  AMYLASE  --  267*  --     Recent Labs  Lab 01/21/21 0456  AMMONIA 42*     ABG    Component Value Date/Time   PHART 7.26 (L) 01/24/2021 0717   PCO2ART 64 (H) 01/24/2021 0717   PO2ART 133 (H) 01/24/2021 0717   HCO3 28.7 (H) 01/24/2021 0717   ACIDBASEDEF 5.8 (H) 01/21/2021 1930   O2SAT 98.6 01/24/2021 0717  Coagulation Profile: Recent Labs  Lab 01/18/2021 1820  INR 1.6*     Cardiac Enzymes: No results for input(s): CKTOTAL, CKMB,  CKMBINDEX, TROPONINI in the last 168 hours.  HbA1C: Hgb A1c MFr Bld  Date/Time Value Ref Range Status  02/03/2021 09:22 PM 5.9 (H) 4.8 - 5.6 % Final    Comment:    (NOTE) Pre diabetes:          5.7%-6.4%  Diabetes:              >6.4%  Glycemic control for   <7.0% adults with diabetes     CBG: Recent Labs  Lab 01/24/21 1509 01/24/21 1908 01/24/21 2319 01/25/21 0320 01/25/21 0717  GLUCAP 161* 169* 218* 180* 175*     Allergies No Known Allergies      DVT/GI PRX  assessed I Assessed the need for Labs I Assessed the need for Foley I Assessed the need for Central Venous Line Family Discussion when available I Assessed the need for Mobilization I made an Assessment of medications to be adjusted accordingly Safety Risk assessment completed  CASE DISCUSSED IN MULTIDISCIPLINARY ROUNDS WITH ICU TEAM     Critical Care Time devoted to patient care services described in this note is 50 minutes.  Critical care was necessary to treat /prevent imminent and life-threatening deterioration. Overall, patient is critically ill, prognosis is guarded.  Patient with Multiorgan failure and at high risk for cardiac arrest and death.    Corrin Parker, M.D.  [facility]  Medical Director [place] Director [facility] Cardio-Pulmonary Department

## 2021-01-26 ENCOUNTER — Inpatient Hospital Stay: Payer: Medicare Other

## 2021-01-26 ENCOUNTER — Encounter: Payer: Self-pay | Admitting: Pulmonary Disease

## 2021-01-26 ENCOUNTER — Other Ambulatory Visit: Payer: Self-pay

## 2021-01-26 DIAGNOSIS — J9601 Acute respiratory failure with hypoxia: Secondary | ICD-10-CM

## 2021-01-26 DIAGNOSIS — J189 Pneumonia, unspecified organism: Secondary | ICD-10-CM

## 2021-01-26 DIAGNOSIS — N179 Acute kidney failure, unspecified: Secondary | ICD-10-CM

## 2021-01-26 DIAGNOSIS — I639 Cerebral infarction, unspecified: Secondary | ICD-10-CM

## 2021-01-26 DIAGNOSIS — R6521 Severe sepsis with septic shock: Secondary | ICD-10-CM

## 2021-01-26 DIAGNOSIS — A419 Sepsis, unspecified organism: Principal | ICD-10-CM

## 2021-01-26 DIAGNOSIS — A4102 Sepsis due to Methicillin resistant Staphylococcus aureus: Secondary | ICD-10-CM

## 2021-01-26 DIAGNOSIS — R0902 Hypoxemia: Secondary | ICD-10-CM

## 2021-01-26 DIAGNOSIS — R4189 Other symptoms and signs involving cognitive functions and awareness: Secondary | ICD-10-CM | POA: Diagnosis not present

## 2021-01-26 LAB — BLOOD GAS, ARTERIAL
Acid-Base Excess: 2.5 mmol/L — ABNORMAL HIGH (ref 0.0–2.0)
Bicarbonate: 29.7 mmol/L — ABNORMAL HIGH (ref 20.0–28.0)
FIO2: 50
MECHVT: 500 mL
Mechanical Rate: 12
O2 Saturation: 97.8 %
PEEP: 10 cmH2O
Patient temperature: 37
pCO2 arterial: 59 mmHg — ABNORMAL HIGH (ref 32.0–48.0)
pH, Arterial: 7.31 — ABNORMAL LOW (ref 7.350–7.450)
pO2, Arterial: 110 mmHg — ABNORMAL HIGH (ref 83.0–108.0)

## 2021-01-26 LAB — LIPID PANEL
Cholesterol: 140 mg/dL (ref 0–200)
HDL: 26 mg/dL — ABNORMAL LOW (ref >40)
LDL Cholesterol: 77 mg/dL (ref 0–99)
Total CHOL/HDL Ratio: 5.4 RATIO
Triglycerides: 186 mg/dL — ABNORMAL HIGH (ref <150)
VLDL: 37 mg/dL (ref 0–40)

## 2021-01-26 LAB — CBC
HCT: 28 % — ABNORMAL LOW (ref 36.0–46.0)
Hemoglobin: 8.9 g/dL — ABNORMAL LOW (ref 12.0–15.0)
MCH: 29.6 pg (ref 26.0–34.0)
MCHC: 31.8 g/dL (ref 30.0–36.0)
MCV: 93 fL (ref 80.0–100.0)
Platelets: 146 10*3/uL — ABNORMAL LOW (ref 150–400)
RBC: 3.01 MIL/uL — ABNORMAL LOW (ref 3.87–5.11)
RDW: 16.1 % — ABNORMAL HIGH (ref 11.5–15.5)
WBC: 14.9 10*3/uL — ABNORMAL HIGH (ref 4.0–10.5)
nRBC: 0 % (ref 0.0–0.2)

## 2021-01-26 LAB — GLUCOSE, CAPILLARY
Glucose-Capillary: 164 mg/dL — ABNORMAL HIGH (ref 70–99)
Glucose-Capillary: 181 mg/dL — ABNORMAL HIGH (ref 70–99)
Glucose-Capillary: 188 mg/dL — ABNORMAL HIGH (ref 70–99)
Glucose-Capillary: 191 mg/dL — ABNORMAL HIGH (ref 70–99)
Glucose-Capillary: 209 mg/dL — ABNORMAL HIGH (ref 70–99)
Glucose-Capillary: 233 mg/dL — ABNORMAL HIGH (ref 70–99)
Glucose-Capillary: 256 mg/dL — ABNORMAL HIGH (ref 70–99)

## 2021-01-26 LAB — RENAL FUNCTION PANEL
Albumin: 2.5 g/dL — ABNORMAL LOW (ref 3.5–5.0)
Anion gap: 5 (ref 5–15)
BUN: 35 mg/dL — ABNORMAL HIGH (ref 8–23)
CO2: 29 mmol/L (ref 22–32)
Calcium: 8.5 mg/dL — ABNORMAL LOW (ref 8.9–10.3)
Chloride: 102 mmol/L (ref 98–111)
Creatinine, Ser: 1.22 mg/dL — ABNORMAL HIGH (ref 0.44–1.00)
GFR, Estimated: 50 mL/min — ABNORMAL LOW (ref >60)
Glucose, Bld: 189 mg/dL — ABNORMAL HIGH (ref 70–99)
Phosphorus: 2.6 mg/dL (ref 2.5–4.6)
Potassium: 4.8 mmol/L (ref 3.5–5.1)
Sodium: 136 mmol/L (ref 135–145)

## 2021-01-26 LAB — CULTURE, BLOOD (ROUTINE X 2)
Culture: NO GROWTH
Special Requests: ADEQUATE

## 2021-01-26 LAB — MAGNESIUM: Magnesium: 2.7 mg/dL — ABNORMAL HIGH (ref 1.7–2.4)

## 2021-01-26 LAB — HEPARIN LEVEL (UNFRACTIONATED): Heparin Unfractionated: <0.1 IU/mL — ABNORMAL LOW (ref 0.30–0.70)

## 2021-01-26 LAB — HEMOGLOBIN A1C
Hgb A1c MFr Bld: 6 % — ABNORMAL HIGH (ref 4.8–5.6)
Mean Plasma Glucose: 125.5 mg/dL

## 2021-01-26 LAB — TRIGLYCERIDES: Triglycerides: 215 mg/dL — ABNORMAL HIGH (ref <150)

## 2021-01-26 MED ORDER — MIDAZOLAM HCL 2 MG/2ML IJ SOLN
2.0000 mg | INTRAMUSCULAR | Status: DC | PRN
  Administered 2021-01-26 – 2021-01-27 (×2): 2 mg via INTRAVENOUS
  Filled 2021-01-26 (×3): qty 2

## 2021-01-26 MED ORDER — LABETALOL HCL 5 MG/ML IV SOLN
INTRAVENOUS | Status: AC
  Administered 2021-01-26: 20 mg via INTRAVENOUS
  Filled 2021-01-26: qty 4

## 2021-01-26 MED ORDER — STERILE WATER FOR INJECTION IJ SOLN
INTRAMUSCULAR | Status: AC
  Filled 2021-01-26: qty 10

## 2021-01-26 MED ORDER — LABETALOL HCL 5 MG/ML IV SOLN
10.0000 mg | INTRAVENOUS | Status: DC | PRN
  Administered 2021-01-27: 10 mg via INTRAVENOUS
  Administered 2021-01-27: 20 mg via INTRAVENOUS
  Administered 2021-01-27: 10 mg via INTRAVENOUS
  Filled 2021-01-26 (×2): qty 4

## 2021-01-26 MED ORDER — LABETALOL HCL 5 MG/ML IV SOLN: 20.0000 mg | Freq: Once | INTRAVENOUS | Status: AC

## 2021-01-26 MED ORDER — HEPARIN SODIUM (PORCINE) 1000 UNIT/ML IJ SOLN
3000.0000 [IU] | Freq: Once | INTRAMUSCULAR | Status: AC
  Administered 2021-01-26: 3000 [IU] via INTRAVENOUS
  Filled 2021-01-26: qty 3

## 2021-01-26 MED ORDER — FENTANYL BOLUS VIA INFUSION
100.0000 ug | Freq: Once | INTRAVENOUS | Status: AC
Start: 2021-01-26 — End: 2021-01-26
  Administered 2021-01-26: 100 ug via INTRAVENOUS
  Filled 2021-01-26: qty 100

## 2021-01-26 MED ORDER — MIDAZOLAM HCL 2 MG/2ML IJ SOLN
2.0000 mg | INTRAMUSCULAR | Status: DC | PRN
  Administered 2021-01-26 – 2021-01-27 (×5): 2 mg via INTRAVENOUS
  Filled 2021-01-26 (×4): qty 2

## 2021-01-26 MED ORDER — NICARDIPINE HCL IN NACL 20-0.86 MG/200ML-% IV SOLN
3.0000 mg/h | INTRAVENOUS | Status: DC
  Administered 2021-01-26: 5 mg/h via INTRAVENOUS
  Administered 2021-01-27 (×2): 12.5 mg/h via INTRAVENOUS
  Administered 2021-01-27: 3 mg/h via INTRAVENOUS
  Administered 2021-01-27: 15 mg/h via INTRAVENOUS
  Administered 2021-01-27: 7.5 mg/h via INTRAVENOUS
  Administered 2021-01-27: 15 mg/h via INTRAVENOUS
  Administered 2021-01-27 (×3): 12.5 mg/h via INTRAVENOUS
  Filled 2021-01-26 (×8): qty 200
  Filled 2021-01-26: qty 400

## 2021-01-26 MED ORDER — INSULIN ASPART 100 UNIT/ML IJ SOLN
0.0000 [IU] | INTRAMUSCULAR | Status: DC
  Administered 2021-01-26: 4 [IU] via SUBCUTANEOUS
  Administered 2021-01-27: 7 [IU] via SUBCUTANEOUS
  Administered 2021-01-27: 11 [IU] via SUBCUTANEOUS
  Administered 2021-01-27: 7 [IU] via SUBCUTANEOUS
  Filled 2021-01-26 (×4): qty 1

## 2021-01-26 MED ORDER — HYDRALAZINE HCL 20 MG/ML IJ SOLN
10.0000 mg | INTRAMUSCULAR | Status: DC | PRN
  Administered 2021-01-26 (×2): 10 mg via INTRAVENOUS
  Filled 2021-01-26: qty 1

## 2021-01-26 NOTE — Progress Notes (Signed)
Pt transported to MRI and back to CCU without any complications.

## 2021-01-26 NOTE — Progress Notes (Signed)
GOALS OF CARE DISCUSSION  The Clinical status was relayed to family in detail. Son and Daughter In [place] at bedside Updated and notified of patients medical condition.    Patient remains unresponsive and will not open eyes to command.   Patient is having a weak cough and struggling to remove secretions.   Patient with increased WOB and using accessory muscles to breathe Explained to family course of therapy and the modalities    Patient with Progressive multiorgan failure with a very high probablity of a very minimal chance of meaningful recovery despite all aggressive and optimal medical therapy.    Family understands the situation.  They have consented and agreed to DNR status  Family are satisfied with Plan of action and management. All questions answered  Additional CC time 25 mins   Krystelle Prashad Patricia Pesa, M.D.  [facility]  Medical Director [place] Director [facility] Cardio-Pulmonary Department

## 2021-01-26 NOTE — Progress Notes (Signed)
Patient transitioned off of Versed gtt to propofol. Currently tolerating, continue to monitor.

## 2021-01-26 NOTE — Progress Notes (Signed)
PHARMACY CONSULT NOTE - FOLLOW UP  Pharmacy Consult for Electrolyte Monitoring and Replacement   Recent Labs: Potassium (mmol/L)  Date Value  01/26/2021 4.8  11/04/2011 4.0   Magnesium (mg/dL)  Date Value  01/26/2021 2.7 (H)   Calcium (mg/dL)  Date Value  01/26/2021 8.5 (L)   Calcium, Total (mg/dL)  Date Value  11/04/2011 8.7   Albumin (g/dL)  Date Value  01/26/2021 2.5 (L)  10/15/2020 4.8   Phosphorus (mg/dL)  Date Value  01/26/2021 2.6   Sodium (mmol/L)  Date Value  01/26/2021 136  11/04/2011 141     Assessment: 61yo female admitted 02/11/2021 with respiratory failure, multifactorial. Patient is intubated in the ICU. Currently on CRRT. Pharmacy consulted for electrolyte replacement.   -free water 30 ml q4h  Goal of Therapy:  Electrolytes WNL  Plan:  --No electrolytes replacement at this time --Follow up with morning labs  Noralee Space, PharmD Clinical Pharmacist 01/26/2021 1:50 PM

## 2021-01-26 NOTE — Progress Notes (Signed)
Pharmacy Antibiotic Note  Victoria Frey is a 61 y.o. female admitted on 01/30/2021 with sepsis.  Pharmacy has been consulted for vancomycin dosing. Patient with AKI started on CRRT. Tracheal aspirate with MRSA  Plan: --1/14 Discontinue CRRT today.  Monitoring daily for HD need. Plan on next scheduled HD Monday 1/16.  --Continue vancomycin 1250 mg IV q24h (for CRRT) thru dose this evening 01/27/20 --Plan for last day of therapy 1/15     Height: 5' 7.01" (170.2 cm) Weight: 85.1 kg (187 lb 9.8 oz) IBW/kg (Calculated) : 61.62  Temp (24hrs), Avg:97.4 F (36.3 C), Min:96.8 F (36 C), Max:99.5 F (37.5 C)  Recent Labs  Lab 01/19/2021 1820 01/19/2021 2308 01/21/21 0435 01/21/21 0456 01/22/21 0503 01/22/21 1600 01/23/21 0458 01/23/21 1551 01/23/21 2025 01/24/21 0448 01/24/21 1519 01/25/21 0510 01/25/21 1600 01/26/21 0434  WBC 13.6*  --  3.8*  --  5.1  --  7.3  --   --  8.9  --   --   --  14.9*  CREATININE 5.44*  --   --    < > 2.60*   < > 1.51*   < >  --  1.30* 1.38* 1.26* 1.27* 1.22*  LATICACIDVEN 4.1* 1.7  --   --   --   --   --   --   --   --   --   --   --   --   VANCOTROUGH  --   --   --   --   --   --   --   --  11*  --   --   --   --   --    < > = values in this interval not displayed.     Estimated Creatinine Clearance: 54.3 mL/min (A) (by C-G formula based on SCr of 1.22 mg/dL (H)).    No Known Allergies  Antimicrobials this admission: Vancomycin 1/8 >>  Zosyn 1/8 >> 1/11  Microbiology results: 1/9 Resp Cx: MRSA 1/8 BCx: NG 1/8 UCx: NG 1/8 MRSA PCR: positive  Thank you for allowing pharmacy to be a part of this patients care.  Noralee Space, PharmD Clinical Pharmacist 01/26/2021 1:52 PM

## 2021-01-26 NOTE — Consult Note (Signed)
Neurology Stroke Consult H&P  Victoria Frey MR# [id number] 01/26/2021   CC: acute stroke  History is obtained from: chart.  HPI: Victoria Frey is a 61 y.o. female PMHx as reviewed below LKW Saturday 01/19/21, when patient complained to a family member she didn't feel well and was having trouble going to the bathroom. EMS called by family 01/31/2021 due to confusion & slurred speech. Per ED report on EMS arrival patient appeared confused and stumbling. Initially, patient was able to walk with assistance to the ambulance, but during transport she became unconscious, bradycardic in the 30's and hypotensive. Patient TC paced and intubated in the field, pt received 500 mL bolus and found to have acute stroke.   LKW: unclear tNK given: No OSW IR Thrombectomy No, not indicated Modified Rankin Scale: 0-Completely asymptomatic and back to baseline post- stroke NIHSS: Patient is mechanically ventilated LOC Responsiveness 3 LOC Questions 1 LOC Commands 2 Horizontal eye movement 1 Visual field 0 Facial palsy 0 Motor arm - Right arm 4 Motor arm - Left arm 4 Motor leg - Right leg 4 Motor leg - Left leg 4 Limb ataxia 0 Sensory test 2 Language 3 Speech 0 Extinction and inattention 0  Result NIHSS 28  ROS: Unable to assess due to encephalopathy and intubated.  Past Medical History:  Diagnosis Date   Bipolar 1 disorder ([place])    Chronic pain    COPD (chronic obstructive pulmonary disease) (HCC)    Depression    Diabetes mellitus without complication ([place])    Drug abuse ([place])    Hypertension     Family History  Problem Relation Age of Onset   Breast cancer Sister 58   Social History:  reports that she has been smoking cigarettes. She has a 45.00 pack-year smoking history. She has never used smokeless tobacco. She reports current alcohol use. She reports current drug use. Drug: Cocaine.   Prior to Admission medications   Medication Sig Start Date End Date Taking? Authorizing Provider   albuterol (VENTOLIN HFA) 108 (90 Base) MCG/ACT inhaler SMARTSIG:2 Inhalation By Mouth Every 4-6 Hours PRN 07/29/20  Yes [provider]  amLODipine (NORVASC) 10 MG tablet Take 10 mg by mouth daily.   Yes [provider]  budesonide-formoterol (SYMBICORT) 160-4.5 MCG/ACT inhaler Inhale into the lungs.   Yes [provider]  busPIRone (BUSPAR) 5 MG tablet Take by mouth.   Yes [provider]  cyclobenzaprine (FLEXERIL) 10 MG tablet Take 10 mg by mouth 3 (three) times daily. 12/19/20  Yes [provider]  FLUoxetine (PROZAC) 20 MG capsule Take 20 mg by mouth daily. 12/12/20  Yes [provider]  FLUoxetine (PROZAC) 40 MG capsule Take 40 mg by mouth daily. 07/30/20  Yes [provider]  folic acid (FOLVITE) 1 MG tablet Take 1 mg by mouth daily. 12/12/20  Yes [provider]  gabapentin (NEURONTIN) 600 MG tablet Take 600 mg by mouth 3 (three) times daily.   Yes [provider]  hydrOXYzine (ATARAX/VISTARIL) 25 MG tablet Take 25 mg by mouth 3 (three) times daily. 07/30/20  Yes [provider]  ibuprofen (ADVIL) 800 MG tablet Take 1 tablet (800 mg total) by mouth every 8 (eight) hours as needed for moderate pain. 10/30/18  Yes Paulette Blanch, MD  losartan (COZAAR) 50 MG tablet Take 50 mg by mouth daily. 07/30/20  Yes [provider]  metFORMIN (GLUCOPHAGE) 500 MG tablet Take 500 mg by mouth 2 (two) times daily. 07/30/20  Yes  [provider]  omeprazole (PRILOSEC) 20 MG capsule Take 20 mg by mouth daily. 08/16/20  Yes [provider]  Plecanatide (TRULANCE) 3 MG TABS Take 3 mg by mouth daily. 12/04/20 03/04/21 Yes Virgel Manifold, MD  QUEtiapine (SEROQUEL) 100 MG tablet Take 200 mg by mouth at bedtime.   Yes [provider]  QUEtiapine (SEROQUEL) 400 MG tablet Take 400 mg by mouth at bedtime. 09/21/20  Yes [provider]  rOPINIRole (REQUIP) 0.25 MG tablet Take by mouth. 09/21/20   Yes [provider]  lactulose, encephalopathy, (CHRONULAC) 10 GM/15ML SOLN Take 10 g by mouth 2 (two) times daily. Patient not taking: Reported on 01/21/2021 08/02/20   [provider]  lip balm (CARMEX) ointment Apply 1 application topically as needed for lip care. Patient not taking: Reported on 01/21/2021 06/28/14   Carlena Hurl, PA-C  mupirocin ointment (BACTROBAN) 2 % Apply topically. Patient not taking: Reported on 01/21/2021 08/02/20   [provider]    Exam: Current vital signs: BP (!) 135/53    Pulse 85    Temp 99.5 F (37.5 C)    Resp 15    Ht 5' 7.01" (1.702 m)    Wt 85.1 kg    SpO2 100%    BMI 29.38 kg/m   Physical Exam  Constitutional: Appears well-developed and well-nourished.  Psych: Unable to assess due to encephalopathy and intubated. Eyes: No scleral injection HENT: No OP obstruction. Head: Normocephalic.  Cardiovascular: Normal rate and regular rhythm.  Respiratory: Ventilated, symmetric excursions bilaterally, no audible wheezing. GI: Soft. No distension. There is no tenderness.  Skin: WDI  Neuro: Mental Status: Unable to assess due to encephalopathy and intubated.  Visual Fields are full. Pupils are ~41mm equal, round, and sluggishly reactive to light. Face: ET tube in place. Unable to assess due to encephalopathy and intubated. Tone is normal. Bulk is normal. No extremity movement to noxious stimuli Deep Tendon Reflexes: Mute throughout. Toes are downgoing bilaterally. FNF and HKS Unable to assess due to encephalopathy and intubated. Gait - Deferred  I have reviewed labs in epic and the pertinent results are: A1c 5.9 LDL 77  I have reviewed the images obtained: MRI brain showed Subcentimeter acute infarct of the right centrum semiovale. MRA head and neck showed Intracranial internal carotid arteries are patent. Middle and anterior cerebral arteries are patent. Intracranial vertebral arteries, basilar artery, posterior  cerebral arteries are patent. Bilateral posterior communicating arteries are present. There is no significant stenosis or aneurysm.  Assessment: Victoria Frey is a 61 y.o. female PMHx as noted above, polysubstance with sepsis, multiorgan system failure and intubated was found to have small acute ischemic stroke in right centrum semiovale.    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 Sepsis with multiorgan failure. MRSA pneumonia bilateral. AKI Mechanically ventilated. Hypotension - resolved.    Plan: - Recommend TTE - Ordered. - LDL 77 recommend low dose statin for LDL < 70 - Consider aspirin 81mg  daily. - SBP goal <140. - Telemetry monitoring for arrhythmia as new-onset atrial fibrillation is a common complication of sepsis. - Recommend bedside Swallow screen when able. - Recommend Stroke education when able. - Recommend PT/OT/SLP consult when able.  This patient is critically ill and at significant risk of neurological worsening, death and care requires constant monitoring of vital signs, hemodynamics,respiratory and cardiac monitoring, neurological assessment, discussion with family, other specialists and medical decision making of high complexity. I spent 73 minutes of neurocritical care  time  in the care of  this patient. This was time spent independent of any time provided by nurse practitioner or PA.  Electronically signed by:  Lynnae Sandhoff, MD Page: [phone] 01/26/2021, 2:15 PM  If 7pm- 7am, please page neurology on call as listed in [place].

## 2021-01-26 NOTE — Progress Notes (Signed)
NAME:  Victoria Frey, MRN:  [id number], DOB:  1960-04-18, LOS: 6 ADMISSION DATE:  01/14/2021  History of Present Illness:  61 yo F with LKW Saturday 01/19/21, when patient complained to a family member she didn't feel well and was having trouble going to the bathroom. EMS called by family 01/23/2021 due to confusion & slurred speech. Per ED report on EMS arrival patient appeared confused and stumbling. Initially, patient was able to walk with assistance to the ambulance, but during transport she became unconscious, bradycardic in the 30's and hypotensive. Patient TC paced and intubated in the field, pt received 500 mL bolus. ED course: Workup completed per Sepsis protocol. Medications given: 3 L NS bolus, Levophed drip started, cefepime/flagyl/vancomycin Initial Vitals: hypothermic 83.5, RR- 14, initially hypertensive 237/73 then subsequently hypotensive 46/32, SpO2 99% on 60 % FiO2 with mechanical ventilatory support Significant labs: (Labs/ Imaging personally reviewed) I, Domingo Pulse [place], AGACNP-BC, personally viewed and interpreted this ECG. Chemistry: mild hyponatremia Na+:133, K+: 3.9, BUN/Cr.: 75/ 5.44, Serum CO2/ AG: 11/18, Alk phos: 72, albumin: 2.7, lipase: 86, AST/ALT: 84/41 Hematology: leukocytosis WBC: 13.6, Hgb: 10.6,  Lactic: 4.1, COVID-19 & Influenza A/B: negative ABG: 6.94/ 46/ 116/ 9.9 CXR 01/24/2021: Diffuse bilateral airspace disease, favor edema/CHF CT chest wo contrast 01/29/2021: Patchy ground-glass densities and consolidation throughout both lungs, concerning for multifocal pneumonia. Alternatively, this could reflect ARDS in the setting of pancreatitis. Trace bilateral pleural effusions in the setting of emphysema.  CT Abdomen and pelvis wo contrast 01/19/2021: Acute pancreatitis.  No peripancreatic fluid collection. Bilateral nonobstructive nephrolithiasis. [place] wo contrast 01/19/2021: no acute intracranial abnormality   PCCM consulted for admission due to acute respiratory failure  and multi-organ failure requiring vasopressors and mechanical ventilatory support.   Pertinent  Medical History  Bipolar disorder 1 & depression HTN COPD not on chronic O2 Current smoker T2DM Polysubstance abuse Significant Hospital Events: Including procedures, antibiotic start and stop dates in addition to other pertinent events   01/29/2021: Admit to ICU with acute respiratory failure requiring mechanical ventilatory support in the setting of acute pancreatitis & circulatory shock on vasopressor support 1/9 severe ARDS, VASC cath placed, CRRT started 1/10 remains critically ill, started TF's 1/11 remains on CRRT, VENT, FEEDS ON HOLD 1/12 increased WOB, on CRRT, on VENT 1/13 remains intubated, MRSA pneumonia 1/14 remains critically ill, MRSA pneumonia, severe air trapping due to COPD   Interim History / Subjective:  Severe resp failure Severe copd Remains on vent Remains critically ill On feeds +COPD exacerbation Progressive renal failure On CRRT   Micro Data:  COVID and FLU NEG MRSA PNEUMONIA  Antimicrobials:   Antibiotics Given (last 72 hours)     Date/Time Action Medication Dose Rate   01/23/21 2155 New Bag/Given   vancomycin (VANCOREADY) IVPB 1250 mg/250 mL 1,250 mg 166.7 mL/hr   01/24/21 2233 New Bag/Given   vancomycin (VANCOREADY) IVPB 1250 mg/250 mL 1,250 mg 166.7 mL/hr   01/25/21 2149 New Bag/Given   vancomycin (VANCOREADY) IVPB 1250 mg/250 mL 1,250 mg 166.7 mL/hr          Objective   Blood pressure (!) 118/54, pulse 72, temperature (!) 97.2 F (36.2 C), temperature source Bladder, resp. rate 10, height 5' 7.01" (1.702 m), weight 85.1 kg, SpO2 98 %.    Vent Mode: PRVC FiO2 (%):  [40 %-50 %] 50 % Set Rate:  [12 bmp-24 bmp] 12 bmp Vt Set:  [500 mL] 500 mL PEEP:  [5 cmH20-10 cmH20] 10 cmH20 Plateau Pressure:  [20 [address]  cmH20] 20 cmH20   Intake/Output Summary (Last 24 hours) at 01/26/2021 0716 Last data filed at 01/26/2021 0700 Gross per 24 hour   Intake 2452.22 ml  Output 4247 ml  Net -1794.78 ml    Filed Weights   01/24/21 0409 01/25/21 0417 01/26/21 0500  Weight: 93.9 kg 89.8 kg 85.1 kg    REVIEW OF SYSTEMS  PATIENT IS UNABLE TO PROVIDE COMPLETE REVIEW OF SYSTEMS DUE TO SEVERE CRITICAL ILLNESS AND TOXIC METABOLIC ENCEPHALOPATHY    PHYSICAL EXAMINATION:  GENERAL:critically ill appearing, +resp distress EYES: Pupils equal, round, reactive to light.  No scleral icterus.  MOUTH: Moist mucosal membrane. INTUBATED NECK: Supple.  PULMONARY: +rhonchi, +wheezing CARDIOVASCULAR: S1 and S2.  No murmurs  GASTROINTESTINAL: Soft, nontender, -distended. Positive bowel sounds.  MUSCULOSKELETAL: No swelling, clubbing, or edema.  NEUROLOGIC: obtunded SKIN:intact,warm,dry  Labs/imaging that I havepersonally reviewed  (right click and "Reselect all SmartList Selections" daily)      ASSESSMENT AND PLAN SYNOPSIS  Severe ACUTE Hypoxic and Hypercapnic Respiratory Failure with septic shock on admission with severe pancreatitis and ARDS with progressive with MRSA PNEUMONIA with severe COPD exacerbation  Severe ACUTE Hypoxic and Hypercapnic Respiratory Failure -continue Mechanical Ventilator support -Wean Fio2 and PEEP as tolerated -VAP/VENT bundle implementation - Wean PEEP & FiO2 as tolerated, maintain SpO2 > 88% - Head of bed elevated 30 degrees, VAP protocol in place - Plateau pressures less than 30 cm H20  - Intermittent chest x-ray & ABG PRN - Ensure adequate pulmonary hygiene  -will NOT perform SAT/SBT when respiratory parameters are met  Vent Mode: PRVC FiO2 (%):  [40 %-50 %] 50 % Set Rate:  [12 bmp-24 bmp] 12 bmp Vt Set:  [500 mL] 500 mL PEEP:  [5 cmH20-10 cmH20] 10 cmH20 Plateau Pressure:  [20 cmH20-22 cmH20] 20 cmH20   SEVERE COPD EXACERBATION MRSA pneumonia -continue IV steroids as prescribed -continue NEB THERAPY as prescribed   CARDIAC ICU monitoring   ACUTE KIDNEY INJURY/Renal Failure -continue  Foley Catheter-assess need -Avoid nephrotoxic agents -Follow urine output, BMP -Ensure adequate renal perfusion, optimize oxygenation -Renal dose medications Remains on CRRT plan for HD   Intake/Output Summary (Last 24 hours) at 01/26/2021 0718 Last data filed at 01/26/2021 0700 Gross per 24 hour  Intake 2452.22 ml  Output 4247 ml  Net -1794.78 ml    NEUROLOGY ACUTE TOXIC METABOLIC ENCEPHALOPATHY -need for sedation -Goal RASS -2 to -3 Will need CT/MRI when stable  SEPTIC shock SOURCE-pancreatitis -use vasopressors to keep MAP>65 as needed Consider repeat CT abd    ENDO - ICU hypoglycemic\Hyperglycemia protocol -check FSBS per protocol   GI GI PROPHYLAXIS as indicated  NUTRITIONAL STATUS DIET-->TF's as tolerated Constipation protocol as indicated   ELECTROLYTES -follow labs as needed -replace as needed -pharmacy consultation and following  Best Practice (right click and "Reselect all SmartList Selections" daily)  Diet/type: NPO w/ meds via tube DVT prophylaxis: prophylactic heparin  GI prophylaxis: H2B Lines: N/A, will be placing arterial & CVC Foley:  Yes, and it is still needed Code Status:  full code    Labs   CBC: Recent Labs  Lab 01/19/2021 1820 01/21/21 0435 01/22/21 0503 01/23/21 0458 01/24/21 0448 01/26/21 0434  WBC 13.6* 3.8* 5.1 7.3 8.9 14.9*  NEUTROABS 11.6*  --   --   --   --   --   HGB 10.6* 11.2* 9.7* 9.2* 8.6* 8.9*  HCT 33.3* 33.1* 28.0* 27.4* 26.3* 28.0*  MCV 92.2 89.9 86.2 89.0 90.4 93.0  PLT 251 265 134*  121* 107* 146*     Basic Metabolic Panel: Recent Labs  Lab 01/22/21 0503 01/22/21 1600 01/23/21 0458 01/23/21 1551 01/24/21 0448 01/24/21 1519 01/25/21 0510 01/25/21 1600 01/26/21 0434  NA 134*   < > 135   < > 135 138 137 136 136  K 4.0   < > 4.3   < > 4.6 4.7 4.7 4.6 4.8  CL 99   < > 100   < > 105 102 102 103 102  CO2 25   < > 29   < > '26 29 28 28 29  ' GLUCOSE 104*   < > 130*   < > 166* 188* 203* 214* 189*  BUN  36*   < > 18   < > 19 26* 32* 34* 35*  CREATININE 2.60*   < > 1.51*   < > 1.30* 1.38* 1.26* 1.27* 1.22*  CALCIUM 6.8*   < > 7.4*   < > 7.4* 8.1* 8.3* 8.3* 8.5*  MG 2.2  --  2.6*  --  2.5*  --  2.5*  --  2.7*  PHOS 3.7   < > 2.4*   < > 3.6 3.2 2.4* 2.4* 2.6   < > = values in this interval not displayed.    GFR: Estimated Creatinine Clearance: 54.3 mL/min (A) (by C-G formula based on SCr of 1.22 mg/dL (H)). Recent Labs  Lab 01/18/2021 1820 01/31/2021 2308 01/21/21 0435 01/21/21 0456 01/22/21 0503 01/23/21 0458 01/24/21 0448 01/26/21 0434  PROCALCITON  --   --   --  4.15 4.18 2.90  --   --   WBC 13.6*  --    < >  --  5.1 7.3 8.9 14.9*  LATICACIDVEN 4.1* 1.7  --   --   --   --   --   --    < > = values in this interval not displayed.     Liver Function Tests: Recent Labs  Lab 01/19/2021 1820 01/21/21 0456 01/21/21 1545 01/24/21 0448 01/24/21 1519 01/25/21 0510 01/25/21 1600 01/26/21 0434  AST 84* 166*  --   --   --   --   --   --   ALT 41 69*  --   --   --   --   --   --   ALKPHOS 72 80  --   --   --   --   --   --   BILITOT 0.7 1.1  --   --   --   --   --   --   PROT 5.5* 5.8*  --   --   --   --   --   --   ALBUMIN 2.7* 2.9*   < > 2.5* 2.4* 2.6* 2.6* 2.5*   < > = values in this interval not displayed.    Recent Labs  Lab 01/15/2021 1820 02/12/2021 2308 01/21/21 0456  LIPASE 86*  --  54*  AMYLASE  --  267*  --     Recent Labs  Lab 01/21/21 0456  AMMONIA 42*     ABG    Component Value Date/Time   PHART 7.31 (L) 01/26/2021 0434   PCO2ART 59 (H) 01/26/2021 0434   PO2ART 110 (H) 01/26/2021 0434   HCO3 29.7 (H) 01/26/2021 0434   ACIDBASEDEF 5.8 (H) 01/21/2021 1930   O2SAT 97.8 01/26/2021 0434      Coagulation Profile: Recent Labs  Lab 01/21/2021 1820  INR 1.6*  Cardiac Enzymes: No results for input(s): CKTOTAL, CKMB, CKMBINDEX, TROPONINI in the last 168 hours.  HbA1C: Hgb A1c MFr Bld  Date/Time Value Ref Range Status  02/04/2021 09:22 PM 5.9 (H)  4.8 - 5.6 % Final    Comment:    (NOTE) Pre diabetes:          5.7%-6.4%  Diabetes:              >6.4%  Glycemic control for   <7.0% adults with diabetes     CBG: Recent Labs  Lab 01/25/21 1122 01/25/21 1546 01/25/21 1923 01/25/21 2337 01/26/21 0355  GLUCAP 216* 192* 156* 206* 164*     Allergies No Known Allergies      DVT/GI PRX  assessed I Assessed the need for Labs I Assessed the need for Foley I Assessed the need for Central Venous Line Family Discussion when available I Assessed the need for Mobilization I made an Assessment of medications to be adjusted accordingly Safety Risk assessment completed  CASE DISCUSSED IN MULTIDISCIPLINARY ROUNDS WITH ICU TEAM     Critical Care Time devoted to patient care services described in this note is 55 minutes.  Critical care was necessary to treat /prevent imminent and life-threatening deterioration. Overall, patient is critically ill, prognosis is guarded.  Patient with Multiorgan failure and at high risk for cardiac arrest and death.    Corrin Parker, M.D.  [facility]  Medical Director [place] Director [facility] Cardio-Pulmonary Department

## 2021-01-26 NOTE — Progress Notes (Signed)
[facility]  ROUNDING NOTE   Subjective:   CRRT  Net -1751m  Off vasopressors.   Tmin 95.4 Wbc 14.9 (8.9)  Remains in critical condition  Objective:  Vital signs in last 24 hours:  Temp:  [95.4 F (35.2 C)-97.9 F (36.6 C)] 97.9 F (36.6 C) (01/14 0900) Pulse Rate:  [66-87] 72 (01/14 0400) Resp:  [10-32] 10 (01/14 0900) BP: (97-171)/(41-65) 124/55 (01/14 0900) SpO2:  [92 %-100 %] 94 % (01/14 0800) Arterial Line BP: (116-172)/(43-82) 140/55 (01/14 0900) FiO2 (%):  [40 %-50 %] 50 % (01/14 0742) Weight:  [85.1 kg] 85.1 kg (01/14 0500)  Weight change: -4.7 kg Filed Weights   01/24/21 0409 01/25/21 0417 01/26/21 0500  Weight: 93.9 kg 89.8 kg 85.1 kg    Intake/Output: I/O last 3 completed shifts: In: 3967.3 [I.V.:1361.1; Other:40; NG/GT:2066.3; IV Piggyback:499.9] Out: 6226 [Urine:50; Other:6176]   Intake/Output this shift:  Total I/O In: 131.3 [I.V.:96.3; NG/GT:35] Out: 140 [Other:140]  Physical Exam: General: Critically ill   Head: ETT  Eyes: Anicteric  Neck: Trachea midling  Lungs:  Bilateral rhonchi, PRVC FiO2 50%  Heart: regular  Abdomen:  Nondistended, +bowel sounds  Extremities: Trace peripheral edema.  Neurologic: Intubated, sedated  Skin: No lesions  Access: Left IJ temporary dialysis catheter 1/9 - ICU    Basic Metabolic Panel: Recent Labs  Lab 01/22/21 0503 01/22/21 1600 01/23/21 0458 01/23/21 1551 01/24/21 0448 01/24/21 1519 01/25/21 0510 01/25/21 1600 01/26/21 0434  NA 134*   < > 135   < > 135 138 137 136 136  K 4.0   < > 4.3   < > 4.6 4.7 4.7 4.6 4.8  CL 99   < > 100   < > 105 102 102 103 102  CO2 25   < > 29   < > '26 29 28 28 29  ' GLUCOSE 104*   < > 130*   < > 166* 188* 203* 214* 189*  BUN 36*   < > 18   < > 19 26* 32* 34* 35*  CREATININE 2.60*   < > 1.51*   < > 1.30* 1.38* 1.26* 1.27* 1.22*  CALCIUM 6.8*   < > 7.4*   < > 7.4* 8.1* 8.3* 8.3* 8.5*  MG 2.2  --  2.6*  --  2.5*  --  2.5*  --  2.7*  PHOS 3.7   < > 2.4*    < > 3.6 3.2 2.4* 2.4* 2.6   < > = values in this interval not displayed.     Liver Function Tests: Recent Labs  Lab 01/19/2021 1820 01/21/21 0456 01/21/21 1545 01/24/21 0448 01/24/21 1519 01/25/21 0510 01/25/21 1600 01/26/21 0434  AST 84* 166*  --   --   --   --   --   --   ALT 41 69*  --   --   --   --   --   --   ALKPHOS 72 80  --   --   --   --   --   --   BILITOT 0.7 1.1  --   --   --   --   --   --   PROT 5.5* 5.8*  --   --   --   --   --   --   ALBUMIN 2.7* 2.9*   < > 2.5* 2.4* 2.6* 2.6* 2.5*   < > = values in this interval not displayed.    Recent Labs  Lab 02/09/2021 1820 01/17/2021 2308 01/21/21 0456  LIPASE 86*  --  54*  AMYLASE  --  267*  --     Recent Labs  Lab 01/21/21 0456  AMMONIA 42*     CBC: Recent Labs  Lab 01/28/2021 1820 01/21/21 0435 01/22/21 0503 01/23/21 0458 01/24/21 0448 01/26/21 0434  WBC 13.6* 3.8* 5.1 7.3 8.9 14.9*  NEUTROABS 11.6*  --   --   --   --   --   HGB 10.6* 11.2* 9.7* 9.2* 8.6* 8.9*  HCT 33.3* 33.1* 28.0* 27.4* 26.3* 28.0*  MCV 92.2 89.9 86.2 89.0 90.4 93.0  PLT 251 265 134* 121* 107* 146*     Cardiac Enzymes: No results for input(s): CKTOTAL, CKMB, CKMBINDEX, TROPONINI in the last 168 hours.  BNP: Invalid input(s): POCBNP  CBG: Recent Labs  Lab 01/25/21 1546 01/25/21 1923 01/25/21 2337 01/26/21 0355 01/26/21 0736  GLUCAP 192* 156* 206* 164* 209*     Microbiology: Results for orders placed or performed during the hospital encounter of 01/15/2021  Resp Panel by RT-PCR (Flu A&B, Covid) Nasopharyngeal Swab     Status: None   Collection Time: 02/09/2021  6:20 PM   Specimen: Nasopharyngeal Swab; Nasopharyngeal(NP) swabs in vial transport medium  Result Value Ref Range Status   SARS Coronavirus 2 by RT PCR NEGATIVE NEGATIVE Final    Comment: (NOTE) SARS-CoV-2 target nucleic acids are NOT DETECTED.  The SARS-CoV-2 RNA is generally detectable in upper respiratory specimens during the acute phase of infection.  The lowest concentration of SARS-CoV-2 viral copies this assay can detect is 138 copies/mL. A negative result does not preclude SARS-Cov-2 infection and should not be used as the sole basis for treatment or other patient management decisions. A negative result may occur with  improper specimen collection/handling, submission of specimen other than nasopharyngeal swab, presence of viral mutation(s) within the areas targeted by this assay, and inadequate number of viral copies(<138 copies/mL). A negative result must be combined with clinical observations, patient history, and epidemiological information. The expected result is Negative.  Fact Sheet for Patients:  EntrepreneurPulse.com.au  Fact Sheet for Healthcare Providers:  IncredibleEmployment.be  This test is no t yet approved or cleared by the [place] FDA and  has been authorized for detection and/or diagnosis of SARS-CoV-2 by FDA under an Emergency Use Authorization (EUA). This EUA will remain  in effect (meaning this test can be used) for the duration of the COVID-19 declaration under Section 564(b)(1) of the Act, 21 U.S.C.section 360bbb-3(b)(1), unless the authorization is terminated  or revoked sooner.       Influenza A by PCR NEGATIVE NEGATIVE Final   Influenza B by PCR NEGATIVE NEGATIVE Final    Comment: (NOTE) The Xpert Xpress SARS-CoV-2/FLU/RSV plus assay is intended as an aid in the diagnosis of influenza from Nasopharyngeal swab specimens and should not be used as a sole basis for treatment. Nasal washings and aspirates are unacceptable for Xpert Xpress SARS-CoV-2/FLU/RSV testing.  Fact Sheet for Patients: EntrepreneurPulse.com.au  Fact Sheet for Healthcare Providers: IncredibleEmployment.be  This test is not yet approved or cleared by the [place] FDA and has been authorized for detection and/or diagnosis of SARS-CoV-2 by FDA  under an Emergency Use Authorization (EUA). This EUA will remain in effect (meaning this test can be used) for the duration of the COVID-19 declaration under Section 564(b)(1) of the Act, 21 U.S.C. section 360bbb-3(b)(1), unless the authorization is terminated or revoked.  Performed at [facility], [address],  [place], [place] [address]   Urine Culture     Status: None   Collection Time: 01/19/2021  6:22 PM   Specimen: Urine, Random  Result Value Ref Range Status   Specimen Description   Final    URINE, RANDOM Performed at [facility], [address], [place], [place] [address]    Special Requests   Final    NONE Performed at [facility], [address] [place], [place] [address]    Culture   Final    NO GROWTH Performed at [facility], [place] [address] [place], [place] [address]    Report Status 01/22/2021 FINAL  Final  Blood Culture (routine x 2)     Status: None   Collection Time: 02/08/2021  6:23 PM   Specimen: Left Antecubital; Blood  Result Value Ref Range Status   Specimen Description LEFT ANTECUBITAL  Final   Special Requests   Final    BOTTLES DRAWN AEROBIC AND ANAEROBIC Blood Culture results may not be optimal due to an inadequate volume of blood received in culture bottles   Culture   Final    NO GROWTH 5 DAYS Performed at [facility], [address], [place], [place] [address]    Report Status 01/25/2021 FINAL  Final  MRSA Next Gen by PCR, Nasal     Status: Abnormal   Collection Time: 02/07/2021  9:59 PM   Specimen: Nasal Mucosa; Nasal Swab  Result Value Ref Range Status   MRSA by PCR Next Gen DETECTED (A) NOT DETECTED Final    Comment: RESULT CALLED TO, READ BACK BY AND VERIFIED WITH: '@[id number]'  Melissa Cobb 01/25/2021 (NOTE) The GeneXpert MRSA Assay (FDA approved for NASAL specimens only), is one component of a comprehensive MRSA colonization surveillance program. It is not intended to diagnose MRSA infection nor  to guide or monitor treatment for MRSA infections. Test performance is not FDA approved in patients less than 98 years old. Performed at [facility], [address] [place], [place] [address]   Culture, Respiratory w Gram Stain     Status: None   Collection Time: 01/21/21  8:35 AM   Specimen: Tracheal Aspirate; Respiratory  Result Value Ref Range Status   Specimen Description   Final    TRACHEAL ASPIRATE Performed at [facility], [address] [place], [place] [address]    Special Requests   Final    NONE Performed at [facility], [address] [place], [place] [address]    Gram Stain   Final    FEW WBC PRESENT, PREDOMINANTLY MONONUCLEAR FEW YEAST WITH PSEUDOHYPHAE RARE GRAM POSITIVE COCCI IN PAIRS Performed at [facility], [place] [address], [place], [place] [address]    Culture   Final    ABUNDANT METHICILLIN RESISTANT STAPHYLOCOCCUS AUREUS ABUNDANT CANDIDA ALBICANS    Report Status 01/24/2021 FINAL  Final   Organism ID, Bacteria METHICILLIN RESISTANT STAPHYLOCOCCUS AUREUS  Final      Susceptibility   Methicillin resistant staphylococcus aureus - MIC*    CIPROFLOXACIN >=8 RESISTANT Resistant     ERYTHROMYCIN >=8 RESISTANT Resistant     GENTAMICIN <=0.5 SENSITIVE Sensitive     OXACILLIN >=4 RESISTANT Resistant     TETRACYCLINE <=1 SENSITIVE Sensitive     VANCOMYCIN 1 SENSITIVE Sensitive     TRIMETH/SULFA >=320 RESISTANT Resistant     CLINDAMYCIN <=0.25 SENSITIVE Sensitive     RIFAMPIN <=0.5 SENSITIVE Sensitive     Inducible Clindamycin NEGATIVE Sensitive     *  ABUNDANT METHICILLIN RESISTANT STAPHYLOCOCCUS AUREUS  Culture, blood (Routine X 2) w Reflex to ID Panel     Status: None   Collection Time: 01/21/21 11:10 PM   Specimen: BLOOD  Result Value Ref Range Status   Specimen Description BLOOD LEFT HAND  Final   Special Requests   Final    BOTTLES DRAWN AEROBIC AND ANAEROBIC Blood Culture adequate volume   Culture   Final     NO GROWTH 5 DAYS Performed at [facility], [address], [place], [place] [address]    Report Status 01/26/2021 FINAL  Final    Coagulation Studies: No results for input(s): LABPROT, INR in the last 72 hours.   Urinalysis: No results for input(s): COLORURINE, LABSPEC, PHURINE, GLUCOSEU, HGBUR, BILIRUBINUR, KETONESUR, PROTEINUR, UROBILINOGEN, NITRITE, LEUKOCYTESUR in the last 72 hours.  Invalid input(s): APPERANCEUR     Imaging: No results found.   Medications:    sodium chloride Stopped (01/25/21 2320)   feeding supplement (VITAL AF 1.2 CAL) 1,000 mL (01/25/21 1715)   fentaNYL infusion INTRAVENOUS 150 mcg/hr (01/26/21 0930)   norepinephrine (LEVOPHED) Adult infusion Stopped (01/24/21 2214)   propofol (DIPRIVAN) infusion 30 mcg/kg/min (01/26/21 0930)   vancomycin Stopped (01/25/21 2319)   vasopressin Stopped (01/24/21 1030)    budesonide (PULMICORT) nebulizer solution  0.5 mg Nebulization BID   chlorhexidine gluconate (MEDLINE KIT)  15 mL Mouth Rinse BID   Chlorhexidine Gluconate Cloth  6 each Topical Q0600   famotidine  20 mg Per Tube QODAY   feeding supplement (PROSource TF)  90 mL Per Tube TID   free water  30 mL Per Tube Q4H   insulin aspart  0-15 Units Subcutaneous Q4H   ipratropium-albuterol  3 mL Nebulization TID   mouth rinse  15 mL Mouth Rinse 10 times per day   methylPREDNISolone (SOLU-MEDROL) injection  40 mg Intravenous Q12H   multivitamin  1 tablet Per Tube QHS   docusate, fentaNYL, midazolam, midazolam, polyethylene glycol, vecuronium  Assessment/ Plan:   Victoria Frey is a 61 y.o. white female with hypertension, COPD with tobacco abuse, diabetes mellitus type II, bipolar disorder, depression, who was admitted to [facility] on 01/21/2021 for Encounter for orogastric (OG) tube placement [Z46.59] Unresponsiveness [R41.89] Septic shock ([place]) [A41.9, R65.21] Acute renal failure, unspecified acute renal failure type ([place]) [N17.9] Pneumonia of both  lungs due to infectious organism, unspecified part of lung [J18.9]  Patient's hospital course with acute pancreatitis, acute respiratory failure with ARDS and multiorgan failure. Initiated on CRRT on 01/21/21. Now has weaned off vasopressors. Remains anuric.   Acute kidney injury requiring renal replacement therapy. Baseline creatinine of 1.03 on 08/2014. No previous urine studies available.  - Discontinue CRRT.  - Monitor for dialysis need. Currently no indication for dialysis.  - Plan on next scheduled intermittent dialysis for Monday 1/16.   Acute respiratory failure: requiring intubation and mechanical ventilation. With ARDS and acute exacerbation of COPD.   Sepsis and Hypotension: weaned off vasopressors. MRSA pneumonia - Stress dose steroids - Continue antibiotics: IV vancomycin   LOS: 6 Shivam Mestas 1/14/202310:50 AM

## 2021-01-27 ENCOUNTER — Inpatient Hospital Stay
Admit: 2021-01-27 | Discharge: 2021-01-27 | Disposition: A | Discharge status: Home / Self Care | Payer: Medicare Other | Attending: Neurology | Admitting: Neurology

## 2021-01-27 DIAGNOSIS — J15212 Pneumonia due to Methicillin resistant Staphylococcus aureus: Secondary | ICD-10-CM

## 2021-01-27 DIAGNOSIS — E8729 Other acidosis: Secondary | ICD-10-CM

## 2021-01-27 DIAGNOSIS — E119 Type 2 diabetes mellitus without complications: Secondary | ICD-10-CM

## 2021-01-27 DIAGNOSIS — A419 Sepsis, unspecified organism: Secondary | ICD-10-CM

## 2021-01-27 DIAGNOSIS — K859 Acute pancreatitis without necrosis or infection, unspecified: Secondary | ICD-10-CM

## 2021-01-27 DIAGNOSIS — R7401 Elevation of levels of liver transaminase levels: Secondary | ICD-10-CM

## 2021-01-27 DIAGNOSIS — J80 Acute respiratory distress syndrome: Secondary | ICD-10-CM

## 2021-01-27 DIAGNOSIS — Z9911 Dependence on respirator [ventilator] status: Secondary | ICD-10-CM

## 2021-01-27 DIAGNOSIS — R4189 Other symptoms and signs involving cognitive functions and awareness: Secondary | ICD-10-CM | POA: Diagnosis not present

## 2021-01-27 DIAGNOSIS — I639 Cerebral infarction, unspecified: Secondary | ICD-10-CM

## 2021-01-27 DIAGNOSIS — J441 Chronic obstructive pulmonary disease with (acute) exacerbation: Secondary | ICD-10-CM

## 2021-01-27 DIAGNOSIS — N179 Acute kidney failure, unspecified: Secondary | ICD-10-CM

## 2021-01-27 DIAGNOSIS — J9601 Acute respiratory failure with hypoxia: Secondary | ICD-10-CM

## 2021-01-27 DIAGNOSIS — Z978 Presence of other specified devices: Secondary | ICD-10-CM

## 2021-01-27 LAB — ECHOCARDIOGRAM COMPLETE
AR max vel: 2.36 cm2
AV Area VTI: 2.75 cm2
AV Area mean vel: 2.4 cm2
AV Mean grad: 12 mmHg
AV Peak grad: 28.9 mmHg
Ao pk vel: 2.69 m/s
Area-P 1/2: 2.59 cm2
Calc EF: 86.4 %
Height: 67.008 in
MV VTI: 2.3 cm2
S' Lateral: 3.8 cm
Single Plane A2C EF: 88.1 %
Single Plane A4C EF: 83.4 %
Weight: 3058.22 oz

## 2021-01-27 LAB — RENAL FUNCTION PANEL
Albumin: 2.4 g/dL — ABNORMAL LOW (ref 3.5–5.0)
Anion gap: 11 (ref 5–15)
BUN: 94 mg/dL — ABNORMAL HIGH (ref 8–23)
CO2: 22 mmol/L (ref 22–32)
Calcium: 9.1 mg/dL (ref 8.9–10.3)
Chloride: 101 mmol/L (ref 98–111)
Creatinine, Ser: 2.84 mg/dL — ABNORMAL HIGH (ref 0.44–1.00)
GFR, Estimated: 18 mL/min — ABNORMAL LOW (ref >60)
Glucose, Bld: 236 mg/dL — ABNORMAL HIGH (ref 70–99)
Phosphorus: 6.4 mg/dL — ABNORMAL HIGH (ref 2.5–4.6)
Potassium: 5.4 mmol/L — ABNORMAL HIGH (ref 3.5–5.1)
Sodium: 134 mmol/L — ABNORMAL LOW (ref 135–145)

## 2021-01-27 LAB — HEPATIC FUNCTION PANEL
ALT: 46 U/L — ABNORMAL HIGH (ref 0–44)
AST: 19 U/L (ref 15–41)
Albumin: 2.5 g/dL — ABNORMAL LOW (ref 3.5–5.0)
Alkaline Phosphatase: 124 U/L (ref 38–126)
Bilirubin, Direct: 0.2 mg/dL (ref 0.0–0.2)
Indirect Bilirubin: 1 mg/dL — ABNORMAL HIGH (ref 0.3–0.9)
Total Bilirubin: 1.2 mg/dL (ref 0.3–1.2)
Total Protein: 6.1 g/dL — ABNORMAL LOW (ref 6.5–8.1)

## 2021-01-27 LAB — CBC
HCT: 28.5 % — ABNORMAL LOW (ref 36.0–46.0)
Hemoglobin: 9 g/dL — ABNORMAL LOW (ref 12.0–15.0)
MCH: 30.1 pg (ref 26.0–34.0)
MCHC: 31.6 g/dL (ref 30.0–36.0)
MCV: 95.3 fL (ref 80.0–100.0)
Platelets: 169 10*3/uL (ref 150–400)
RBC: 2.99 MIL/uL — ABNORMAL LOW (ref 3.87–5.11)
RDW: 15.9 % — ABNORMAL HIGH (ref 11.5–15.5)
WBC: 18.5 10*3/uL — ABNORMAL HIGH (ref 4.0–10.5)
nRBC: 0 % (ref 0.0–0.2)

## 2021-01-27 LAB — GLUCOSE, CAPILLARY
Glucose-Capillary: 201 mg/dL — ABNORMAL HIGH (ref 70–99)
Glucose-Capillary: 254 mg/dL — ABNORMAL HIGH (ref 70–99)
Glucose-Capillary: 227 mg/dL — ABNORMAL HIGH (ref 70–99)

## 2021-01-27 LAB — MAGNESIUM: Magnesium: 2.6 mg/dL — ABNORMAL HIGH (ref 1.7–2.4)

## 2021-01-27 MED ORDER — POLYVINYL ALCOHOL 1.4 % OP SOLN: 1.0000 [drp] | Freq: Four times a day (QID) | OPHTHALMIC | Status: DC | PRN

## 2021-01-27 MED ORDER — MIDAZOLAM HCL 2 MG/2ML IJ SOLN
2.0000 mg | INTRAMUSCULAR | Status: DC | PRN
  Administered 2021-01-27: 4 mg via INTRAVENOUS
  Filled 2021-01-27 (×2): qty 4

## 2021-01-27 MED ORDER — ACETAMINOPHEN 325 MG PO TABS: 650.0000 mg | ORAL_TABLET | Freq: Four times a day (QID) | ORAL | Status: DC | PRN

## 2021-01-27 MED ORDER — POLYVINYL ALCOHOL 1.4 % OP SOLN
1.0000 [drp] | Freq: Four times a day (QID) | OPHTHALMIC | Status: DC | PRN
  Filled 2021-01-27: qty 15

## 2021-01-27 MED ORDER — DEXTROSE 5 % IV SOLN: INTRAVENOUS | Status: DC

## 2021-01-27 MED ORDER — FENTANYL CITRATE PF 50 MCG/ML IJ SOSY: 25.0000 ug | PREFILLED_SYRINGE | INTRAMUSCULAR | Status: DC | PRN

## 2021-01-27 MED ORDER — GLYCOPYRROLATE 0.2 MG/ML IJ SOLN: 0.2000 mg | INTRAMUSCULAR | Status: DC | PRN

## 2021-01-27 MED ORDER — DIPHENHYDRAMINE HCL 50 MG/ML IJ SOLN: 25.0000 mg | INTRAMUSCULAR | Status: DC | PRN

## 2021-01-27 MED ORDER — GLYCOPYRROLATE 1 MG PO TABS: 1.0000 mg | ORAL_TABLET | ORAL | Status: DC | PRN

## 2021-01-27 MED ORDER — ACETAMINOPHEN 650 MG RE SUPP: 650.0000 mg | Freq: Four times a day (QID) | RECTAL | Status: DC | PRN

## 2021-01-27 MED ORDER — GLYCOPYRROLATE 1 MG PO TABS
1.0000 mg | ORAL_TABLET | ORAL | Status: DC | PRN
  Filled 2021-01-27: qty 1

## 2021-01-27 MED ORDER — MORPHINE 100MG IN NS 100ML (1MG/ML) PREMIX INFUSION
0.0000 mg/h | INTRAVENOUS | Status: DC
  Administered 2021-01-27: 5 mg/h via INTRAVENOUS
  Filled 2021-01-27: qty 100

## 2021-01-27 MED ORDER — HYDRALAZINE HCL 20 MG/ML IJ SOLN
20.0000 mg | Freq: Four times a day (QID) | INTRAMUSCULAR | Status: DC
  Administered 2021-01-27: 20 mg via INTRAVENOUS
  Filled 2021-01-27: qty 1

## 2021-01-27 MED ORDER — MORPHINE BOLUS VIA INFUSION
5.0000 mg | INTRAVENOUS | Status: DC | PRN
  Filled 2021-01-27: qty 5

## 2021-01-27 MED ORDER — LORAZEPAM 2 MG/ML IJ SOLN
2.0000 mg | INTRAMUSCULAR | Status: DC | PRN
  Administered 2021-01-27: 4 mg via INTRAVENOUS
  Filled 2021-01-27: qty 2

## 2021-01-27 MED ORDER — MORPHINE SULFATE (PF) 2 MG/ML IV SOLN: 2.0000 mg | INTRAVENOUS | Status: DC | PRN

## 2021-01-27 MED ORDER — GLYCOPYRROLATE 0.2 MG/ML IJ SOLN
0.2000 mg | INTRAMUSCULAR | Status: DC | PRN
  Filled 2021-01-27: qty 1

## 2021-01-28 ENCOUNTER — Other Ambulatory Visit: Payer: Medicare Other

## 2021-02-06 LAB — BLOOD GAS, ARTERIAL
Acid-base deficit: 21.8 mmol/L — ABNORMAL HIGH (ref 0.0–2.0)
Bicarbonate: 9.9 mmol/L — ABNORMAL LOW (ref 20.0–28.0)
FIO2: 60
MECHVT: 450 mL
O2 Saturation: 94.8 %
PEEP: 5 cmH2O
Patient temperature: 37
RATE: 16 resp/min
pCO2 arterial: 46 mmHg (ref 32.0–48.0)
pH, Arterial: 6.94 — CL (ref 7.350–7.450)
pO2, Arterial: 116 mmHg — ABNORMAL HIGH (ref 83.0–108.0)

## 2021-02-13 NOTE — Progress Notes (Signed)
Discussion regarding stroke.  Called Mr. Aline Brochure (son) [phone]  We discussed a number of possible causes for the stroke and that sepsis has been identified as a trigger for stroke but the underlying mechanisms and risk factors are not clear. However, most likely etiology is multifactorial.  Though the residual effects of the stroke would be very difficult to predict however, the size and location of stroke are such that the spectrum of deficits would be none to minimal hypothetically a sensory ataxia to mild weakness in her left arm. What is clear is that her current condition is out of proportion to what would be expected based on imaging of her brain.  Electronically signed by:  Lynnae Sandhoff, MD Page: [phone] 02-15-2021, 11:49 AM

## 2021-02-13 NOTE — Progress Notes (Signed)
PHARMACY CONSULT NOTE - FOLLOW UP  Pharmacy Consult for Electrolyte Monitoring and Replacement   Recent Labs: Potassium (mmol/L)  Date Value  Jan 28, 2021 5.4 (H)  11/04/2011 4.0   Magnesium (mg/dL)  Date Value  January 28, 2021 2.6 (H)   Calcium (mg/dL)  Date Value  January 28, 2021 9.1   Calcium, Total (mg/dL)  Date Value  11/04/2011 8.7   Albumin (g/dL)  Date Value  Jan 28, 2021 2.4 (L)  2021/01/28 2.5 (L)  10/15/2020 4.8   Phosphorus (mg/dL)  Date Value  01-28-21 6.4 (H)   Sodium (mmol/L)  Date Value  28-Jan-2021 134 (L)  11/04/2011 141     Assessment: 61yo female admitted 01/26/2021 with respiratory failure, multifactorial. Patient is intubated in the ICU. Currently on CRRT. Pharmacy consulted for electrolyte replacement.   -free water 30 ml q4h  Goal of Therapy:  Electrolytes WNL  Plan:  --No electrolytes replacement at this time --Follow up with morning labs  Noralee Space, PharmD Clinical Pharmacist 2021/01/28 10:45 AM

## 2021-02-13 NOTE — Death Summary Note (Signed)
DEATH SUMMARY   Patient Details  Name: Victoria Frey MRN: [id number] DOB: [birth year]  Admission/Discharge Information   Admit Date:  02-19-21  Date of Death:  02-26-2021  Time of Death:  20:14  Length of Stay: 7  Referring Physician: Denton Lank, MD   Reason(s) for Hospitalization  During welfare check with EMS patient became unconscious bradycardic and hypotensive requiring emergent intubation, transcutaneous pacing and IV fluid  Diagnoses  Preliminary cause of death:  Secondary Diagnoses (including complications and co-morbidities):  Principal Problem:   Unresponsiveness Active Problems:   Acute respiratory failure with hypoxia ([place])   On mechanically assisted ventilation (HCC)   Acute pancreatitis   Acute respiratory distress syndrome (ARDS) (HCC)   MRSA pneumonia ([place])   COPD with acute exacerbation ([place])   Encounter for continuous renal replacement therapy (CRRT) for acute renal failure ([place])   Septic shock ([place])   Acute ischemic stroke ([place])   Transaminitis   DM (diabetes mellitus), type 2 ([place])   Increased anion gap metabolic acidosis   Brief Hospital Course (including significant findings, care, treatment, and services provided and events leading to death)  Victoria Frey is a 61 y.o. year old female who spoke with family 01/19/2021 at which point she complained to a family member she did not feel well and was having trouble going to the bathroom.  EMS was called by family on February 19, 2021 due to perceived confusion and slurred speech Per ED report on EMS arrival patient appeared confused and stumbling.  Initially patient was able to walk with assistance of the ambulance but during transport she became unconscious bradycardic in the 30s and hypotensive requiring emergent intubation in the field as well as transcutaneous pacing. Upon arrival to ED she was hypothermic, initially hypertensive and subsequently hypotensive requiring mechanical ventilatory support.  She was worked  up per sepsis protocol with IV fluid resuscitation Levophed drip for hypotension and empiric antibiotics.  PCCM consulted for admission due to acute respiratory failure with multiorgan failure requiring vasopressors and mechanical ventilatory support.  Initial head CT without contrast was negative for acute intracranial abnormality and CT abdomen pelvis without contrast showed acute pancreatitis without fluid collection as well as bilateral nonobstructive nephrolithiasis.  Chest CT without contrast was concerning for multifocal pneumonia versus ARDS in the setting of pancreatitis.  Patient stabilized with multiple vasopressors and high support via mechanical ventilation as well as sodium bicarbonate drip.  Continuous renal replacement therapy was initiated 01/21/2021.  Tracheal aspirate positive for MRSA pneumonia, antibiotics adjusted.  Due to continued encephalopathy, MRA head and MRI brain completed revealing acute infarct of the right centrum semiovale.  CRRT completed 01/26/2021 and due to patient's neurological status CODE STATUS was changed by family to DNR.  On Feb 26, 2021 the decision was made by family to proceed with comfort measures and the patient was terminally extubated, passing away with family bedside.  Pertinent Labs and Studies  Significant Diagnostic Studies DG Chest 1 View  Result Date: 01/24/2021 CLINICAL DATA:  62 year old female with history of shortness of breath. EXAM: CHEST  1 VIEW COMPARISON:  Chest x-ray 01/23/2021. FINDINGS: An endotracheal tube is in place with tip 4.8 cm above the carina. There is a right-sided internal jugular central venous catheter with tip terminating in the mid superior vena cava. Nasogastric tube has been partially withdrawn, with tip in the mid esophagus. Lung volumes are low. Diffuse peribronchial cuffing, widespread interstitial opacities and ill-defined opacities are again scattered throughout the lungs bilaterally, overall slightly improved aeration  compared  to the prior examination. No pleural effusions. Pulmonary vasculature does not appearing origin. Heart size is normal. Upper mediastinal contours are within normal limits. Atherosclerotic calcifications are noted in the thoracic aorta. IMPRESSION: 1. Support apparatus, as above. Please take note of the high position of the nasogastric tube which is now in the mid esophagus, and consider advancement approximally 20 cm for more optimal placement. 2. The appearance the chest suggests resolving multilobar bilateral bronchopneumonia, as above. 3. Aortic atherosclerosis. No active disease. Electronically Signed   By: Vinnie Langton M.D.   On: 01/24/2021 07:25   DG Abd 1 View  Result Date: 01/24/2021 CLINICAL DATA:  Encounter for gastric tube placement EXAM: ABDOMEN - 1 VIEW COMPARISON:  None. FINDINGS: Orogastric tube with tip in the gastric antrum. Side port below the GE junction. IMPRESSION: Orogastric tube with  tip in the distal stomach Electronically Signed   By: Suzy Bouchard M.D.   On: 01/24/2021 10:46   DG Abd 1 View  Result Date: 01/23/2021 CLINICAL DATA:  Abdominal distension. EXAM: ABDOMEN - 1 VIEW COMPARISON:  View abdomen 01/21/21 and one-view abdomen 01/17/2021. FINDINGS: Side port of the NG tube is in the stomach. Left IJ femoral line is stable. Bowel gas pattern is unremarkable. No obstruction or free air is present. Axial skeleton is within limits. Interstitial edema or airspace disease noted at right base. IMPRESSION: 1. Side port of the NG tube is in the stomach. 2. No acute abnormality. Electronically Signed   By: San Morelle M.D.   On: 01/23/2021 14:12   DG Abd 1 View  Result Date: 01/21/2021 CLINICAL DATA:  Left femoral line placement EXAM: ABDOMEN - 1 VIEW COMPARISON:  None. FINDINGS: Left femoral line is in place with the tip in the upper pelvis, likely in the left common iliac vein. Gas within large and small bowel. No acute bony abnormality. IMPRESSION: Left central  line tip in the upper pelvis, likely in the left common iliac vein. Electronically Signed   By: Rolm Baptise M.D.   On: 01/21/2021 00:17   DG Abd 1 View  Result Date: 02/07/2021 CLINICAL DATA:  OG tube placement EXAM: ABDOMEN - 1 VIEW COMPARISON:  02/10/2021 FINDINGS: Esophageal tube tip now overlies the gastroduodenal region. Mild diffuse gaseous dilatation of bowel potentially due to a mild ileus. Right femoral catheter is looped back upon itself with the tip directed caudal and beyond the image in the region of the right groin. IMPRESSION: 1. Esophageal tube tip overlies the gastroduodenal region 2. Right femoral catheter appears looped back upon itself at the right groin with the tip directed caudal but not included on the image 3. Probable ileus These results will be called to the ordering clinician or representative by the Radiologist Assistant, and communication documented in the PACS or [employer]. Electronically Signed   By: Donavan Foil M.D.   On: 01/14/2021 23:30   DG Abd 1 View  Result Date: 01/24/2021 CLINICAL DATA:  OG tube placement. EXAM: ABDOMEN - 1 VIEW COMPARISON:  None. FINDINGS: The tip of the enteric tube is in the distal esophagus, side-port in the mid esophagus. Advancement of at least 15 cm is recommended to place the side-port below the diaphragm. Multiple overlying monitoring devices. IMPRESSION: Tip of the enteric tube in the distal esophagus, side-port in the mid esophagus. Advancement of at least 15 cm is recommended to place the side-port below the diaphragm. Electronically Signed   By: Keith Rake M.D.   On: 01/17/2021 19:30  CT Head Wo Contrast  Result Date: 02/09/2021 CLINICAL DATA:  Mental status change, unknown cause EXAM: CT HEAD WITHOUT CONTRAST TECHNIQUE: Contiguous axial images were obtained from the base of the skull through the vertex without intravenous contrast. COMPARISON:  Head CT 10/15/2018 FINDINGS: Brain: Brain volume is normal for age. No  intracranial hemorrhage, mass effect, or midline shift. Gray-white differentiation is preserved. No hydrocephalus. The basilar cisterns are patent. No evidence of territorial infarct or acute ischemia. No extra-axial or intracranial fluid collection. Vascular: No hyperdense vessel or unexpected calcification. Skull: No fracture or focal lesion. Sinuses/Orbits: Mild mucosal thickening of right side of sphenoid sinus. Remaining paranasal sinuses are clear. No acute orbital findings. Mastoid air cells are clear. Other: None. IMPRESSION: No acute intracranial abnormality. Electronically Signed   By: Keith Rake M.D.   On: 02/11/2021 20:22   MR ANGIO HEAD WO CONTRAST  Result Date: 01/26/2021 CLINICAL DATA:  Anoxic brain damage EXAM: MRI HEAD WITHOUT CONTRAST MRA HEAD WITHOUT CONTRAST TECHNIQUE: Multiplanar, multi-echo pulse sequences of the brain and surrounding structures were acquired without intravenous contrast. Angiographic images of the Circle of Willis were acquired using MRA technique without intravenous contrast. COMPARISON:  No pertinent prior exam. FINDINGS: MRI HEAD Brain: Subcentimeter focus of restricted diffusion in the right centrum semiovale. No evidence of intracranial hemorrhage. There is no intracranial mass, mass effect, or edema. Ventricles and sulci are normal in size and configuration. Minimal patchy T2 hyperintensity in the supratentorial white matter is nonspecific but may reflect minor chronic microvascular ischemic changes. There is no hydrocephalus or extra-axial fluid collection. Vascular: Major vessel flow voids at the skull base are preserved. Skull and upper cervical spine: Normal marrow signal is preserved. Sinuses/Orbits: Paranasal sinuses are aerated. Orbits are unremarkable. Other: Sella is unremarkable.  Bilateral mastoid effusions. MRA HEAD Intracranial internal carotid arteries are patent. Middle and anterior cerebral arteries are patent. Intracranial vertebral arteries,  basilar artery, posterior cerebral arteries are patent. Bilateral posterior communicating arteries are present. There is no significant stenosis or aneurysm. IMPRESSION: Subcentimeter acute infarct of the right centrum semiovale. Unremarkable vascular imaging. Electronically Signed   By: Macy Mis M.D.   On: 01/26/2021 12:33   MR BRAIN WO CONTRAST  Result Date: 01/26/2021 CLINICAL DATA:  Anoxic brain damage EXAM: MRI HEAD WITHOUT CONTRAST MRA HEAD WITHOUT CONTRAST TECHNIQUE: Multiplanar, multi-echo pulse sequences of the brain and surrounding structures were acquired without intravenous contrast. Angiographic images of the Circle of Willis were acquired using MRA technique without intravenous contrast. COMPARISON:  No pertinent prior exam. FINDINGS: MRI HEAD Brain: Subcentimeter focus of restricted diffusion in the right centrum semiovale. No evidence of intracranial hemorrhage. There is no intracranial mass, mass effect, or edema. Ventricles and sulci are normal in size and configuration. Minimal patchy T2 hyperintensity in the supratentorial white matter is nonspecific but may reflect minor chronic microvascular ischemic changes. There is no hydrocephalus or extra-axial fluid collection. Vascular: Major vessel flow voids at the skull base are preserved. Skull and upper cervical spine: Normal marrow signal is preserved. Sinuses/Orbits: Paranasal sinuses are aerated. Orbits are unremarkable. Other: Sella is unremarkable.  Bilateral mastoid effusions. MRA HEAD Intracranial internal carotid arteries are patent. Middle and anterior cerebral arteries are patent. Intracranial vertebral arteries, basilar artery, posterior cerebral arteries are patent. Bilateral posterior communicating arteries are present. There is no significant stenosis or aneurysm. IMPRESSION: Subcentimeter acute infarct of the right centrum semiovale. Unremarkable vascular imaging. Electronically Signed   By: Macy Mis M.D.   On:  01/26/2021  12:33   DG CHEST PORT 1 VIEW  Result Date: 01/23/2021 CLINICAL DATA:  Hypoxia. EXAM: PORTABLE CHEST 1 VIEW COMPARISON:  01/22/2021 FINDINGS: Endotracheal tube is near the carina and similar to the previous examination. Nasogastric tube extends into the abdomen. No significant change in the patchy bilateral airspace disease. There is a right jugular dialysis catheter with the tip in the lower SVC. Heart size is within normal limits and stable. Negative for pneumothorax. IMPRESSION: 1. No change in the bilateral airspace densities. 2. Endotracheal tube is stable in position near the carina. Electronically Signed   By: Markus Daft M.D.   On: 01/23/2021 14:13   DG Chest Port 1 View  Result Date: 01/22/2021 CLINICAL DATA:  Respiratory failure. EXAM: PORTABLE CHEST 1 VIEW COMPARISON:  01/21/2021 FINDINGS: The endotracheal tube is in good position at the mid tracheal level. The right IJ catheter and NG tubes are stable. Persistent diffuse interstitial and airspace process in the lungs. No pleural effusions. IMPRESSION: 1. Stable support apparatus. 2. Persistent diffuse interstitial and airspace process. Electronically Signed   By: Marijo Sanes M.D.   On: 01/22/2021 10:00   DG Chest Port 1 View  Result Date: 01/21/2021 CLINICAL DATA:  Central line placement. EXAM: PORTABLE CHEST 1 VIEW COMPARISON:  January 20, 2021. FINDINGS: Stable cardiomediastinal silhouette. Endotracheal and nasogastric tubes are in good position. Right internal jugular catheter is noted with distal tip in expected position of SVC. No pneumothorax or pleural effusion is noted. Able bilateral lung opacities are noted concerning for edema or multifocal pneumonia. Bony thorax is unremarkable. IMPRESSION: Interval placement of right internal jugular catheter with distal tip in expected position of SVC. No pneumothorax is noted. Stable support apparatus and bilateral lung opacities as described above. Electronically Signed   By: Marijo Conception M.D.   On: 01/21/2021 11:05   DG Chest Port 1 View  Result Date: 01/19/2021 CLINICAL DATA:  Questionable sepsis EXAM: PORTABLE CHEST 1 VIEW COMPARISON:  11/06/2010 FINDINGS: Endotracheal tube is 4 cm above the carina. NG tube is in the distal esophagus. Cardiomegaly. Bilateral airspace disease, favor edema. No effusions or acute bony abnormality. IMPRESSION: Diffuse bilateral airspace disease, favor edema/CHF. Endotracheal tube in expected position. NG tube tip in the distal esophagus. Electronically Signed   By: Rolm Baptise M.D.   On: 01/13/2021 19:28   ECHOCARDIOGRAM COMPLETE  Result Date: 2021/02/17    ECHOCARDIOGRAM REPORT   Patient Name:   JOZIE WULF Date of Exam: 02-17-2021 Medical Rec #:  [id number]       Height:       67.0 in Accession #:    [id number]      Weight:       191.1 lb Date of Birth:  1960/01/22       BSA:          1.984 m Patient Age:    28 years        BP:           148/53 mmHg Patient Gender: F               HR:           71 bpm. Exam Location:  [facility] Procedure: 2D Echo Indications:     Stroke I63.9  History:         Patient has no prior history of Echocardiogram examinations.                  COPD; Risk Factors:Hypertension, Current  Smoker and                  Polysubstance Abuse.  Sonographer:     L Thornton-Maynard Referring Phys:  [id number] AVWUJW J COLLINS Diagnosing Phys: Donnelly Angelica IMPRESSIONS  1. Left ventricular ejection fraction, by estimation, is >75%. The left ventricle has hyperdynamic function. The left ventricle has no regional wall motion abnormalities. Left ventricular diastolic parameters were normal.  2. Right ventricular systolic function is normal. The right ventricular size is normal.  3. The mitral valve is normal in structure. No evidence of mitral valve regurgitation. No evidence of mitral stenosis.  4. High flow state with mild LVOT gradient (12 mmHg mean, 29 mmHg peak). The aortic valve was not well visualized. Aortic valve regurgitation is not  visualized. No aortic stenosis is present. FINDINGS  Left Ventricle: Left ventricular ejection fraction, by estimation, is >75%. The left ventricle has hyperdynamic function. The left ventricle has no regional wall motion abnormalities. The left ventricular internal cavity size was normal in size. There is no left ventricular hypertrophy. Left ventricular diastolic parameters were normal. Right Ventricle: The right ventricular size is normal. No increase in right ventricular wall thickness. Right ventricular systolic function is normal. Left Atrium: Left atrial size was normal in size. Right Atrium: Right atrial size was normal in size. Pericardium: There is no evidence of pericardial effusion. Mitral Valve: The mitral valve is normal in structure. No evidence of mitral valve regurgitation. No evidence of mitral valve stenosis. MV peak gradient, 9.1 mmHg. The mean mitral valve gradient is 4.0 mmHg. Tricuspid Valve: The tricuspid valve is normal in structure. Tricuspid valve regurgitation is not demonstrated. No evidence of tricuspid stenosis. Aortic Valve: High flow state with mild LVOT gradient (12 mmHg mean, 29 mmHg peak). The aortic valve was not well visualized. Aortic valve regurgitation is not visualized. No aortic stenosis is present. Aortic valve mean gradient measures 12.0 mmHg. Aortic valve peak gradient measures 28.9 mmHg. Aortic valve area, by VTI measures 2.75 cm. Pulmonic Valve: The pulmonic valve was not well visualized. Aorta: The aortic root is normal in size and structure. Venous: IVC assessment for right atrial pressure unable to be performed due to mechanical ventilation. IAS/Shunts: No atrial level shunt detected by color flow Doppler.  LEFT VENTRICLE PLAX 2D LVIDd:         5.60 cm     Diastology LVIDs:         3.80 cm     LV e' medial:    7.89 cm/s LV PW:         1.00 cm     LV E/e' medial:  9.3 LV IVS:        1.00 cm     LV e' lateral:   7.89 cm/s LVOT diam:     2.10 cm     LV E/e' lateral:  9.3 LV SV:         111 LV SV Index:   56 LVOT Area:     3.46 cm  LV Volumes (MOD) LV vol d, MOD A2C: 69.0 ml LV vol d, MOD A4C: 77.8 ml LV vol s, MOD A2C: 8.2 ml LV vol s, MOD A4C: 12.9 ml LV SV MOD A2C:     60.8 ml LV SV MOD A4C:     77.8 ml LV SV MOD BP:      63.2 ml RIGHT VENTRICLE             IVC RV S prime:  22.20 cm/s  IVC diam: 2.20 cm TAPSE (M-mode): 3.2 cm LEFT ATRIUM             Index        RIGHT ATRIUM           Index LA diam:        3.90 cm 1.97 cm/m   RA Area:     14.20 cm LA Vol (A2C):   59.7 ml 30.10 ml/m  RA Volume:   34.10 ml  17.19 ml/m LA Vol (A4C):   43.4 ml 21.88 ml/m LA Biplane Vol: 54.8 ml 27.63 ml/m  AORTIC VALVE                     PULMONIC VALVE AV Area (Vmax):    2.36 cm      PV Vmax:       1.33 m/s AV Area (Vmean):   2.40 cm      PV Peak grad:  7.1 mmHg AV Area (VTI):     2.75 cm AV Vmax:           269.00 cm/s AV Vmean:          156.000 cm/s AV VTI:            0.404 m AV Peak Grad:      28.9 mmHg AV Mean Grad:      12.0 mmHg LVOT Vmax:         183.00 cm/s LVOT Vmean:        108.000 cm/s LVOT VTI:          0.321 m LVOT/AV VTI ratio: 0.79  AORTA Ao Root diam: 3.30 cm MITRAL VALVE MV Area (PHT): 2.59 cm     SHUNTS MV Area VTI:   2.30 cm     Systemic VTI:  0.32 m MV Peak grad:  9.1 mmHg     Systemic Diam: 2.10 cm MV Mean grad:  4.0 mmHg MV Vmax:       1.51 m/s MV Vmean:      95.5 cm/s MV E velocity: 73.70 cm/s MV A velocity: 126.00 cm/s MV E/A ratio:  0.58 MV A Prime:    11.8 cm/s Donnelly Angelica Electronically signed by Donnelly Angelica Signature Date/Time: 02/12/2021/12:58:02 PM    Final    CT CHEST ABDOMEN PELVIS WO CONTRAST  Result Date: 01/30/2021 CLINICAL DATA:  Sepsis. EXAM: CT CHEST, ABDOMEN AND PELVIS WITHOUT CONTRAST TECHNIQUE: Multidetector CT imaging of the chest, abdomen and pelvis was performed following the standard protocol without IV contrast. COMPARISON:  Chest and abdominal x-rays from same day. Abdominal ultrasound dated February 11, 2019. CT abdomen and pelvis dated  September 18, 2006. FINDINGS: CT CHEST FINDINGS Cardiovascular: No significant vascular findings. Mild cardiomegaly. No pericardial effusion. No thoracic aortic aneurysm. Coronary, aortic arch, and branch vessel atherosclerotic vascular disease. Mediastinum/Nodes: No enlarged mediastinal, hilar, or axillary lymph nodes. Thyroid gland, trachea, and esophagus demonstrate no significant findings. Lungs/Pleura: Endotracheal tube tip 2.6 cm above the carina. Moderate upper lobe predominant centrilobular emphysema. Patchy ground-glass densities and consolidation throughout both lungs. Trace bilateral pleural effusions. No pneumothorax. Musculoskeletal: No acute or significant osseous findings. CT ABDOMEN PELVIS FINDINGS Hepatobiliary: No focal liver abnormality. Distended gallbladder without radiopaque gallstones, wall thickening, or biliary dilatation. Pancreas: Mild peripancreatic inflammatory change without fluid collection. No ductal dilatation. Spleen: Normal in size without focal abnormality. Adrenals/Urinary Tract: The adrenal glands are unremarkable. Punctate bilateral renal calculi. No hydronephrosis. The bladder is decompressed by Foley catheter. Stomach/Bowel:  Enteric tube in the stomach. The stomach is within normal limits. No bowel wall thickening, distention, or surrounding inflammatory changes. Normal appendix with appendicoliths. Vascular/Lymphatic: Aortic atherosclerosis. No enlarged abdominal or pelvic lymph nodes. Reproductive: Status post hysterectomy. No adnexal masses. Other: No abdominal wall hernia or abnormality. No abdominopelvic ascites. No pneumoperitoneum. Musculoskeletal: No acute or significant osseous findings. IMPRESSION: Chest: 1. Patchy ground-glass densities and consolidation throughout both lungs, concerning for multifocal pneumonia. Alternatively, this could reflect ARDS in the setting of pancreatitis. 2. Trace bilateral pleural effusions. 3. Aortic Atherosclerosis (ICD10-I70.0) and  Emphysema (ICD10-J43.9). Abdomen and pelvis: 1. Acute pancreatitis.  No peripancreatic fluid collection. 2. Bilateral nonobstructive nephrolithiasis. Electronically Signed   By: Titus Dubin M.D.   On: 02/04/2021 20:37    Microbiology Recent Results (from the past 240 hour(s))  Resp Panel by RT-PCR (Flu A&B, Covid) Nasopharyngeal Swab     Status: None   Collection Time: 02/05/2021  6:20 PM   Specimen: Nasopharyngeal Swab; Nasopharyngeal(NP) swabs in vial transport medium  Result Value Ref Range Status   SARS Coronavirus 2 by RT PCR NEGATIVE NEGATIVE Final    Comment: (NOTE) SARS-CoV-2 target nucleic acids are NOT DETECTED.  The SARS-CoV-2 RNA is generally detectable in upper respiratory specimens during the acute phase of infection. The lowest concentration of SARS-CoV-2 viral copies this assay can detect is 138 copies/mL. A negative result does not preclude SARS-Cov-2 infection and should not be used as the sole basis for treatment or other patient management decisions. A negative result may occur with  improper specimen collection/handling, submission of specimen other than nasopharyngeal swab, presence of viral mutation(s) within the areas targeted by this assay, and inadequate number of viral copies(<138 copies/mL). A negative result must be combined with clinical observations, patient history, and epidemiological information. The expected result is Negative.  Fact Sheet for Patients:  EntrepreneurPulse.com.au  Fact Sheet for Healthcare Providers:  IncredibleEmployment.be  This test is no t yet approved or cleared by the [place] FDA and  has been authorized for detection and/or diagnosis of SARS-CoV-2 by FDA under an Emergency Use Authorization (EUA). This EUA will remain  in effect (meaning this test can be used) for the duration of the COVID-19 declaration under Section 564(b)(1) of the Act, 21 U.S.C.section 360bbb-3(b)(1), unless  the authorization is terminated  or revoked sooner.       Influenza A by PCR NEGATIVE NEGATIVE Final   Influenza B by PCR NEGATIVE NEGATIVE Final    Comment: (NOTE) The Xpert Xpress SARS-CoV-2/FLU/RSV plus assay is intended as an aid in the diagnosis of influenza from Nasopharyngeal swab specimens and should not be used as a sole basis for treatment. Nasal washings and aspirates are unacceptable for Xpert Xpress SARS-CoV-2/FLU/RSV testing.  Fact Sheet for Patients: EntrepreneurPulse.com.au  Fact Sheet for Healthcare Providers: IncredibleEmployment.be  This test is not yet approved or cleared by the [place] FDA and has been authorized for detection and/or diagnosis of SARS-CoV-2 by FDA under an Emergency Use Authorization (EUA). This EUA will remain in effect (meaning this test can be used) for the duration of the COVID-19 declaration under Section 564(b)(1) of the Act, 21 U.S.C. section 360bbb-3(b)(1), unless the authorization is terminated or revoked.  Performed at [facility], [address], [place], [place] [address]   Urine Culture     Status: None   Collection Time: 01/13/2021  6:22 PM   Specimen: Urine, Random  Result Value Ref Range Status   Specimen Description   Final  URINE, RANDOM Performed at [facility], [address] [place], [place] [address]    Special Requests   Final    NONE Performed at [facility], [address], [place], [place] [address]    Culture   Final    NO GROWTH Performed at [facility], [place] [address] [place], [place] [address]    Report Status 01/22/2021 FINAL  Final  Blood Culture (routine x 2)     Status: None   Collection Time: 01/21/2021  6:23 PM   Specimen: Left Antecubital; Blood  Result Value Ref Range Status   Specimen Description LEFT ANTECUBITAL  Final   Special Requests   Final    BOTTLES DRAWN AEROBIC AND ANAEROBIC Blood Culture results  may not be optimal due to an inadequate volume of blood received in culture bottles   Culture   Final    NO GROWTH 5 DAYS Performed at [facility], [address] [place], [place] [address]    Report Status 01/25/2021 FINAL  Final  MRSA Next Gen by PCR, Nasal     Status: Abnormal   Collection Time: 01/18/2021  9:59 PM   Specimen: Nasal Mucosa; Nasal Swab  Result Value Ref Range Status   MRSA by PCR Next Gen DETECTED (A) NOT DETECTED Final    Comment: RESULT CALLED TO, READ BACK BY AND VERIFIED WITH: @[id number]  Melissa Cobb 02/07/2021 (NOTE) The GeneXpert MRSA Assay (FDA approved for NASAL specimens only), is one component of a comprehensive MRSA colonization surveillance program. It is not intended to diagnose MRSA infection nor to guide or monitor treatment for MRSA infections. Test performance is not FDA approved in patients less than 22 years old. Performed at [facility], [address] [place], [place] [address]   Culture, Respiratory w Gram Stain     Status: None   Collection Time: 01/21/21  8:35 AM   Specimen: Tracheal Aspirate; Respiratory  Result Value Ref Range Status   Specimen Description   Final    TRACHEAL ASPIRATE Performed at [facility], [address] [place], [place] [address]    Special Requests   Final    NONE Performed at [facility], [address] [place], [place] [address]    Gram Stain   Final    FEW WBC PRESENT, PREDOMINANTLY MONONUCLEAR FEW YEAST WITH PSEUDOHYPHAE RARE GRAM POSITIVE COCCI IN PAIRS Performed at [facility], [address], [place], [place] [address]    Culture   Final    ABUNDANT METHICILLIN RESISTANT STAPHYLOCOCCUS AUREUS ABUNDANT CANDIDA ALBICANS    Report Status 01/24/2021 FINAL  Final   Organism ID, Bacteria METHICILLIN RESISTANT STAPHYLOCOCCUS AUREUS  Final      Susceptibility   Methicillin resistant staphylococcus aureus - MIC*    CIPROFLOXACIN >=8 RESISTANT Resistant      ERYTHROMYCIN >=8 RESISTANT Resistant     GENTAMICIN <=0.5 SENSITIVE Sensitive     OXACILLIN >=4 RESISTANT Resistant     TETRACYCLINE <=1 SENSITIVE Sensitive     VANCOMYCIN 1 SENSITIVE Sensitive     TRIMETH/SULFA >=320 RESISTANT Resistant     CLINDAMYCIN <=0.25 SENSITIVE Sensitive     RIFAMPIN <=0.5 SENSITIVE Sensitive     Inducible Clindamycin NEGATIVE Sensitive     * ABUNDANT METHICILLIN RESISTANT STAPHYLOCOCCUS AUREUS  Culture, blood (Routine X 2) w Reflex to ID Panel     Status: None   Collection Time: 01/21/21 11:10 PM   Specimen: BLOOD  Result Value Ref  Range Status   Specimen Description BLOOD LEFT HAND  Final   Special Requests   Final    BOTTLES DRAWN AEROBIC AND ANAEROBIC Blood Culture adequate volume   Culture   Final    NO GROWTH 5 DAYS Performed at [facility], [address] [place], [place] [address]    Report Status 01/26/2021 FINAL  Final    Lab Basic Metabolic Panel: Recent Labs  Lab 01/23/21 0458 01/23/21 1551 01/24/21 0448 01/24/21 1519 01/25/21 0510 01/25/21 1600 01/26/21 0434 01/30/21 0341  NA 135   < > 135 138 137 136 136 134*  K 4.3   < > 4.6 4.7 4.7 4.6 4.8 5.4*  CL 100   < > 105 102 102 103 102 101  CO2 29   < > 26 29 28 28 29 22   GLUCOSE 130*   < > 166* 188* 203* 214* 189* 236*  BUN 18   < > 19 26* 32* 34* 35* 94*  CREATININE 1.51*   < > 1.30* 1.38* 1.26* 1.27* 1.22* 2.84*  CALCIUM 7.4*   < > 7.4* 8.1* 8.3* 8.3* 8.5* 9.1  MG 2.6*  --  2.5*  --  2.5*  --  2.7* 2.6*  PHOS 2.4*   < > 3.6 3.2 2.4* 2.4* 2.6 6.4*   < > = values in this interval not displayed.   Liver Function Tests: Recent Labs  Lab 01/21/21 0456 01/21/21 1545 01/24/21 1519 01/25/21 0510 01/25/21 1600 01/26/21 0434 2021-01-30 0341  AST 166*  --   --   --   --   --  19  ALT 69*  --   --   --   --   --  46*  ALKPHOS 80  --   --   --   --   --  124  BILITOT 1.1  --   --   --   --   --  1.2  PROT 5.8*  --   --   --   --   --  6.1*  ALBUMIN 2.9*   < > 2.4* 2.6*  2.6* 2.5* 2.4*   2.5*   < > = values in this interval not displayed.   Recent Labs  Lab 02/08/2021 2308 01/21/21 0456  LIPASE  --  54*  AMYLASE 267*  --    Recent Labs  Lab 01/21/21 0456  AMMONIA 42*   CBC: Recent Labs  Lab 01/22/21 0503 01/23/21 0458 01/24/21 0448 01/26/21 0434 01/30/2021 0341  WBC 5.1 7.3 8.9 14.9* 18.5*  HGB 9.7* 9.2* 8.6* 8.9* 9.0*  HCT 28.0* 27.4* 26.3* 28.0* 28.5*  MCV 86.2 89.0 90.4 93.0 95.3  PLT 134* 121* 107* 146* 169   Cardiac Enzymes: No results for input(s): CKTOTAL, CKMB, CKMBINDEX, TROPONINI in the last 168 hours. Sepsis Labs: Recent Labs  Lab 01/15/2021 2308 01/21/21 0435 01/21/21 0456 01/22/21 0503 01/23/21 0458 01/24/21 0448 01/26/21 0434 Jan 30, 2021 0341  PROCALCITON  --   --  4.15 4.18 2.90  --   --   --   WBC  --    < >  --  5.1 7.3 8.9 14.9* 18.5*  LATICACIDVEN 1.7  --   --   --   --   --   --   --    < > = values in this interval not displayed.    Procedures/Operations  01/21/2021 ET tube placement 02/10/2021 arterial catheter placement 01/21/2021 central line placement 01/21/2021 HD catheter placement   Toribio Harbour  L Rust-Chester 2021-02-03, 9:14 PM   Domingo Pulse Rust-[place]ster, AGACNP-BC Acute Care Nurse Practitioner [facility]   [phone] / [phone] Please see Amion for pager details.

## 2021-02-13 NOTE — Progress Notes (Signed)
Pt extubated, no complications.

## 2021-02-13 NOTE — Progress Notes (Signed)
Family member visited via [facility]

## 2021-02-13 NOTE — Progress Notes (Signed)
Patients blood pressure has been fluctuating greatly throughout the night. NP at bedside multiple times.   Per neurology recommendation to keep Systolic <397 PRN hydralazine was given earlier in the shift. One dose of Vecuronium was given at 2108 for asynchronous breathing after fentanyl and versed prns were trialed.  Systolic blood pressure went into the 230's following administration of Vec. Labetalol PRN and nicardipine gtt were used to lower BP. Drip was stopped due to low BP. After map dropped into the low 60s/high 50s, levophed was started at 2 mcg. Levophed was only used for 20 minutes before her systolic was above 953.  In the following hour her BP slowly begin to climb again, versed PRN given, nicardipine gtt was restarted. Will continue to monitor.

## 2021-02-13 NOTE — Progress Notes (Signed)
GOALS OF CARE DISCUSSION  The Clinical status was relayed to family in detail. Son and Daughter in law at bedside Updated and notified of patients medical condition.  Patient remains unresponsive and will not open eyes to command.   Patient is having a weak cough and struggling to remove secretions.   Patient with increased WOB and using accessory muscles to breathe Explained to family course of therapy and the modalities    Patient with Progressive multiorgan failure with a very high probablity of a very minimal chance of meaningful recovery despite all aggressive and optimal medical therapy.   Family understands the situation.  They have consented and agreed to DNR status. At this time, I have had a lengthy discussion with friends and family and the patient would NOT want to live on machines and she definitely would not want hemodialysis.  I have explained the next best loving decision for the family is to proceed with comfort care measures.   The family will let us know when to proceed with comfort measures.   Family are satisfied with Plan of action and management. All questions answered  Additional CC time 25 mins   Jasmane Brockway Patricia Pesa, M.D.  [facility]  Medical Director [place] Director [facility] Cardio-Pulmonary Department

## 2021-02-13 NOTE — Progress Notes (Signed)
NAME:  Victoria Frey, MRN:  [id number], DOB:  11-23-1960, LOS: 7 ADMISSION DATE:  01/26/2021  History of Present Illness:  60 yo F with LKW Saturday 01/19/21, when patient complained to a family member she didn't feel well and was having trouble going to the bathroom. EMS called by family 01/13/2021 due to confusion & slurred speech. Per ED report on EMS arrival patient appeared confused and stumbling. Initially, patient was able to walk with assistance to the ambulance, but during transport she became unconscious, bradycardic in the 30's and hypotensive. Patient TC paced and intubated in the field, pt received 500 mL bolus. ED course: Workup completed per Sepsis protocol. Medications given: 3 L NS bolus, Levophed drip started, cefepime/flagyl/vancomycin Initial Vitals: hypothermic 83.5, RR- 14, initially hypertensive 237/73 then subsequently hypotensive 46/32, SpO2 99% on 60 % FiO2 with mechanical ventilatory support Significant labs: (Labs/ Imaging personally reviewed) I, Domingo Pulse Rust-[place], AGACNP-BC, personally viewed and interpreted this ECG. Chemistry: mild hyponatremia Na+:133, K+: 3.9, BUN/Cr.: 75/ 5.44, Serum CO2/ AG: 11/18, Alk phos: 72, albumin: 2.7, lipase: 86, AST/ALT: 84/41 Hematology: leukocytosis WBC: 13.6, Hgb: 10.6,  Lactic: 4.1, COVID-19 & Influenza A/B: negative ABG: 6.94/ 46/ 116/ 9.9 CXR 02/01/2021: Diffuse bilateral airspace disease, favor edema/CHF CT chest wo contrast 01/17/2021: Patchy ground-glass densities and consolidation throughout both lungs, concerning for multifocal pneumonia. Alternatively, this could reflect ARDS in the setting of pancreatitis. Trace bilateral pleural effusions in the setting of emphysema.  CT Abdomen and pelvis wo contrast 02/05/2021: Acute pancreatitis.  No peripancreatic fluid collection. Bilateral nonobstructive nephrolithiasis. [place] wo contrast 02/06/2021: no acute intracranial abnormality   PCCM consulted for admission due to acute respiratory failure  and multi-organ failure requiring vasopressors and mechanical ventilatory support.   Pertinent  Medical History  Bipolar disorder 1 & depression HTN COPD not on chronic O2 Current smoker T2DM Polysubstance abuse Significant Hospital Events: Including procedures, antibiotic start and stop dates in addition to other pertinent events   02/12/2021: Admit to ICU with acute respiratory failure requiring mechanical ventilatory support in the setting of acute pancreatitis & circulatory shock on vasopressor support 1/9 severe ARDS, VASC cath placed, CRRT started 1/10 remains critically ill, started TF's 1/11 remains on CRRT, VENT, FEEDS ON HOLD 1/12 increased WOB, on CRRT, on VENT 1/13 remains intubated, MRSA pneumonia 1/14 remains critically ill, MRSA pneumonia, severe air trapping due to COPD, MRI +acute stroke 1/14 family updated, patient is now DNR, she would NOT want trach 1/15 remains critically ill, on vent, severe HTN   Interim History / Subjective:  Severe COPD Severe resp failure Remains on vent Remains critically ill Remains on vent Severe HTN on nicardipine Progressive renal failure Will need HD    Micro Data:  COVID and FLU NEG MRSA PNEUMONIA  Antimicrobials:   Antibiotics Given (last 72 hours)     Date/Time Action Medication Dose Rate   01/24/21 2233 New Bag/Given   vancomycin (VANCOREADY) IVPB 1250 mg/250 mL 1,250 mg 166.7 mL/hr   01/25/21 2149 New Bag/Given   vancomycin (VANCOREADY) IVPB 1250 mg/250 mL 1,250 mg 166.7 mL/hr   01/26/21 2139 New Bag/Given   vancomycin (VANCOREADY) IVPB 1250 mg/250 mL 1,250 mg 166.7 mL/hr          Objective   Blood pressure (!) 148/53, pulse 66, temperature 99.9 F (37.7 C), resp. rate (!) 28, height 5' 7.01" (1.702 m), weight 86.7 kg, SpO2 99 %.    Vent Mode: PRVC FiO2 (%):  [40 %-50 %] 40 % Set Rate:  [  12 bmp] 12 bmp Vt Set:  [500 mL] 500 mL PEEP:  [10 cmH20] 10 cmH20 Plateau Pressure:  [17 cmH20-19 cmH20] 19 cmH20    Intake/Output Summary (Last 24 hours) at Feb 02, 2021 0726 Last data filed at 02/02/21 0600 Gross per 24 hour  Intake 1858.17 ml  Output 395 ml  Net 1463.17 ml    Filed Weights   01/25/21 0417 01/26/21 0500 02/02/21 0339  Weight: 89.8 kg 85.1 kg 86.7 kg    REVIEW OF SYSTEMS  PATIENT IS UNABLE TO PROVIDE COMPLETE REVIEW OF SYSTEMS DUE TO SEVERE CRITICAL ILLNESS AND TOXIC METABOLIC ENCEPHALOPATHY    PHYSICAL EXAMINATION:  GENERAL:critically ill appearing, +resp distress EYES: Pupils equal, round, reactive to light.  No scleral icterus.  MOUTH: Moist mucosal membrane. INTUBATED NECK: Supple.  PULMONARY: +rhonchi, +wheezing CARDIOVASCULAR: S1 and S2.  No murmurs  GASTROINTESTINAL: Soft, nontender, -distended. Positive bowel sounds.  MUSCULOSKELETAL: No swelling, clubbing, or edema.  NEUROLOGIC: obtunded SKIN:intact,warm,dry    Labs/imaging that I havepersonally reviewed  (right click and "Reselect all SmartList Selections" daily)      ASSESSMENT AND PLAN SYNOPSIS  Admitted for Severe ACUTE Hypoxic and Hypercapnic Respiratory Failure with septic shock on admission with severe pancreatitis and ARDS with progressive with MRSA PNEUMONIA with severe COPD exacerbation with acute stroke with failure to wean from vent   Severe ACUTE Hypoxic and Hypercapnic Respiratory Failure -continue Mechanical Ventilator support -Wean Fio2 and PEEP as tolerated -VAP/VENT bundle implementation - Wean PEEP & FiO2 as tolerated, maintain SpO2 > 88% - Head of bed elevated 30 degrees, VAP protocol in place - Plateau pressures less than 30 cm H20  - Intermittent chest x-ray & ABG PRN - Ensure adequate pulmonary hygiene  -will NOT perform SAT/SBT when respiratory parameters are met  Vent Mode: PRVC FiO2 (%):  [40 %-50 %] 40 % Set Rate:  [12 bmp] 12 bmp Vt Set:  [500 mL] 500 mL PEEP:  [10 cmH20] 10 cmH20 Plateau Pressure:  [17 cmH20-19 cmH20] 19 cmH20   SEVERE COPD EXACERBATION MRSA  pneumonia -continue IV steroids as prescribed -continue NEB THERAPY as prescribed   CARDIAC ICU monitoring  ACUTE KIDNEY INJURY/Renal Failure -continue Foley Catheter-assess need -Avoid nephrotoxic agents -Follow urine output, BMP -Ensure adequate renal perfusion, optimize oxygenation -Renal dose medications HD as needed   Intake/Output Summary (Last 24 hours) at Feb 02, 2021 0729 Last data filed at 02-02-2021 0600 Gross per 24 hour  Intake 1858.17 ml  Output 395 ml  Net 1463.17 ml     NEUROLOGY ACUTE TOXIC METABOLIC ENCEPHALOPATHY MRI shows acute stroke Follow up Neuro recs   SEPTIC shock resolved SOURCE-pancreatitis  HTN crisis Nicardipine as needed   ENDO - ICU hypoglycemic\Hyperglycemia protocol -check FSBS per protocol   GI GI PROPHYLAXIS as indicated  NUTRITIONAL STATUS DIET-->TF's as tolerated Constipation protocol as indicated   ELECTROLYTES -follow labs as needed -replace as needed -pharmacy consultation and following  Best Practice (right click and "Reselect all SmartList Selections" daily)  Diet/type: NPO w/ meds via tube DVT prophylaxis: prophylactic heparin  GI prophylaxis: H2B Lines: N/A, will be placing arterial & CVC Foley:  Yes, and it is still needed Code Status:  DNR status    Labs   CBC: Recent Labs  Lab 02/07/2021 1820 01/21/21 0435 01/22/21 0503 01/23/21 0458 01/24/21 0448 01/26/21 0434 02-Feb-2021 0341  WBC 13.6*   < > 5.1 7.3 8.9 14.9* 18.5*  NEUTROABS 11.6*  --   --   --   --   --   --  HGB 10.6*   < > 9.7* 9.2* 8.6* 8.9* 9.0*  HCT 33.3*   < > 28.0* 27.4* 26.3* 28.0* 28.5*  MCV 92.2   < > 86.2 89.0 90.4 93.0 95.3  PLT 251   < > 134* 121* 107* 146* 169   < > = values in this interval not displayed.     Basic Metabolic Panel: Recent Labs  Lab 01/23/21 0458 01/23/21 1551 01/24/21 0448 01/24/21 1519 01/25/21 0510 01/25/21 1600 01/26/21 0434 02/02/21 0341  NA 135   < > 135 138 137 136 136 134*  K 4.3   < >  4.6 4.7 4.7 4.6 4.8 5.4*  CL 100   < > 105 102 102 103 102 101  CO2 29   < > '26 29 28 28 29 22  ' GLUCOSE 130*   < > 166* 188* 203* 214* 189* 236*  BUN 18   < > 19 26* 32* 34* 35* 94*  CREATININE 1.51*   < > 1.30* 1.38* 1.26* 1.27* 1.22* 2.84*  CALCIUM 7.4*   < > 7.4* 8.1* 8.3* 8.3* 8.5* 9.1  MG 2.6*  --  2.5*  --  2.5*  --  2.7* 2.6*  PHOS 2.4*   < > 3.6 3.2 2.4* 2.4* 2.6 6.4*   < > = values in this interval not displayed.    GFR: Estimated Creatinine Clearance: 23.5 mL/min (A) (by C-G formula based on SCr of 2.84 mg/dL (H)). Recent Labs  Lab 01/31/2021 1820 01/26/2021 2308 01/21/21 0435 01/21/21 0456 01/22/21 0503 01/23/21 0458 01/24/21 0448 01/26/21 0434 02-Feb-2021 0341  PROCALCITON  --   --   --  4.15 4.18 2.90  --   --   --   WBC 13.6*  --    < >  --  5.1 7.3 8.9 14.9* 18.5*  LATICACIDVEN 4.1* 1.7  --   --   --   --   --   --   --    < > = values in this interval not displayed.     Liver Function Tests: Recent Labs  Lab 01/21/2021 1820 01/21/21 0456 01/21/21 1545 01/24/21 1519 01/25/21 0510 01/25/21 1600 01/26/21 0434 February 02, 2021 0341  AST 84* 166*  --   --   --   --   --  19  ALT 41 69*  --   --   --   --   --  46*  ALKPHOS 72 80  --   --   --   --   --  124  BILITOT 0.7 1.1  --   --   --   --   --  1.2  PROT 5.5* 5.8*  --   --   --   --   --  6.1*  ALBUMIN 2.7* 2.9*   < > 2.4* 2.6* 2.6* 2.5* 2.4*   2.5*   < > = values in this interval not displayed.    Recent Labs  Lab 01/30/2021 1820 02/04/2021 2308 01/21/21 0456  LIPASE 86*  --  54*  AMYLASE  --  267*  --     Recent Labs  Lab 01/21/21 0456  AMMONIA 42*     ABG    Component Value Date/Time   PHART 7.31 (L) 01/26/2021 0434   PCO2ART 59 (H) 01/26/2021 0434   PO2ART 110 (H) 01/26/2021 0434   HCO3 29.7 (H) 01/26/2021 0434   ACIDBASEDEF 5.8 (H) 01/21/2021 1930   O2SAT 97.8 01/26/2021 0434  Coagulation Profile: Recent Labs  Lab 01/14/2021 1820  INR 1.6*     Cardiac Enzymes: No results for  input(s): CKTOTAL, CKMB, CKMBINDEX, TROPONINI in the last 168 hours.  HbA1C: Hgb A1c MFr Bld  Date/Time Value Ref Range Status  01/26/2021 03:39 PM 6.0 (H) 4.8 - 5.6 % Final    Comment:    (NOTE) Pre diabetes:          5.7%-6.4%  Diabetes:              >6.4%  Glycemic control for   <7.0% adults with diabetes   02/10/2021 09:22 PM 5.9 (H) 4.8 - 5.6 % Final    Comment:    (NOTE) Pre diabetes:          5.7%-6.4%  Diabetes:              >6.4%  Glycemic control for   <7.0% adults with diabetes     CBG: Recent Labs  Lab 01/26/21 1616 01/26/21 1950 01/26/21 2015 01/26/21 2347 30-Jan-2021 0409  GLUCAP 191* 233* 256* 188* 201*     Allergies No Known Allergies      DVT/GI PRX  assessed I Assessed the need for Labs I Assessed the need for Foley I Assessed the need for Central Venous Line Family Discussion when available I Assessed the need for Mobilization I made an Assessment of medications to be adjusted accordingly Safety Risk assessment completed   Critical Care Time devoted to patient care services described in this note is 50 minutes.  Critical care was necessary to treat /prevent imminent and life-threatening deterioration. Overall, patient is critically ill, prognosis is guarded.  Patient with Multiorgan failure and at high risk for cardiac arrest and death.    Corrin Parker, M.D.  [facility]  Medical Director [place] Director [facility] Cardio-Pulmonary Department

## 2021-02-13 NOTE — Progress Notes (Signed)
[facility]  ROUNDING NOTE   Subjective:   Taken off CRRT yesterday. UOP 288m. However more urine in foley bag this morning.  Off vasopressors.   Changed to DNR  MRI with acute right CVA.   Objective:  Vital signs in last 24 hours:  Temp:  [97.3 F (36.3 C)-100 F (37.8 C)] 99.9 F (37.7 C) (01/15 0900) Pulse Rate:  [66-101] 72 (01/15 0900) Resp:  [11-39] 33 (01/15 0900) BP: (116-173)/(39-64) 132/54 (01/15 0900) SpO2:  [92 %-100 %] 100 % (01/15 0900) Arterial Line BP: (107-236)/(39-69) 140/50 (01/15 0900) FiO2 (%):  [40 %-50 %] 40 % (01/15 0745) Weight:  [86.7 kg] 86.7 kg (01/15 0339)  Weight change: 1.6 kg Filed Weights   01/25/21 0417 01/26/21 0500 [id number]  Weight: 89.8 kg 85.1 kg 86.7 kg    Intake/Output: I/O last 3 completed shifts: In: 3147.6 [I.V.:1378.4; Other:40; NG/GT:1229.2; IV Piggyback:500] Out: 2624 [Urine:305; OVQQUI:1146]  Intake/Output this shift:  Total I/O In: 288.3 [I.V.:288.3] Out: -   Physical Exam: General: Critically ill   Head: ETT  Eyes: Anicteric  Neck: Trachea midling  Lungs:  Bilateral rhonchi, PRVC FiO2 40%  Heart: regular  Abdomen:  Nondistended, +bowel sounds  Extremities: No peripheral edema.  Neurologic: Intubated, sedated  Skin: No lesions  Access: Left IJ temporary dialysis catheter 1/9 - ICU    Basic Metabolic Panel: Recent Labs  Lab 01/23/21 0458 01/23/21 1551 01/24/21 0448 01/24/21 1519 01/25/21 0510 01/25/21 1600 01/26/21 0434 [id number]  NA 135   < > 135 138 137 136 136 134*  K 4.3   < > 4.6 4.7 4.7 4.6 4.8 5.4*  CL 100   < > 105 102 102 103 102 101  CO2 29   < > _0 GLUCOSE 130*   < > 166* 188* 203* 214* 189* 236*  BUN 18   < > 19 26* 32* 34* 35* 94*  CREATININE 1.51*   < > 1.30* 1.38* 1.26* 1.27* 1.22* 2.84*  CALCIUM 7.4*   < > 7.4* 8.1* 8.3* 8.3* 8.5* 9.1  MG 2.6*  --  2.5*  --  2.5*  --  2.7* 2.6*  PHOS 2.4*   < > 3.6 3.2 2.4* 2.4* 2.6 6.4*   < > = values in  this interval not displayed.     Liver Function Tests: Recent Labs  Lab 02/07/2021 1820 01/21/21 0456 01/21/21 1545 01/24/21 1519 01/25/21 0510 01/25/21 1600 01/26/21 0434 0Jan 20, [id number]  AST 84* 166*  --   --   --   --   --  19  ALT 41 69*  --   --   --   --   --  46*  ALKPHOS 72 80  --   --   --   --   --  124  BILITOT 0.7 1.1  --   --   --   --   --  1.2  PROT 5.5* 5.8*  --   --   --   --   --  6.1*  ALBUMIN 2.7* 2.9*   < > 2.4* 2.6* 2.6* 2.5* 2.4*   2.5*   < > = values in this interval not displayed.    Recent Labs  Lab 02/10/2021 1820 01/29/2021 2308 01/21/21 0456  LIPASE 86*  --  54*  AMYLASE  --  267*  --     Recent Labs  Lab 01/21/21 0456  AMMONIA 42*  CBC: Recent Labs  Lab 02/05/2021 1820 01/21/21 0435 01/22/21 0503 01/23/21 0458 01/24/21 0448 01/26/21 0434 February 24, 2021 0341  WBC 13.6*   < > 5.1 7.3 8.9 14.9* 18.5*  NEUTROABS 11.6*  --   --   --   --   --   --   HGB 10.6*   < > 9.7* 9.2* 8.6* 8.9* 9.0*  HCT 33.3*   < > 28.0* 27.4* 26.3* 28.0* 28.5*  MCV 92.2   < > 86.2 89.0 90.4 93.0 95.3  PLT 251   < > 134* 121* 107* 146* 169   < > = values in this interval not displayed.     Cardiac Enzymes: No results for input(s): CKTOTAL, CKMB, CKMBINDEX, TROPONINI in the last 168 hours.  BNP: Invalid input(s): POCBNP  CBG: Recent Labs  Lab 01/26/21 1950 01/26/21 2015 01/26/21 2347 Feb 24, 2021 0409 Feb 24, 2021 0747  GLUCAP 233* 256* 188* 201* 254*     Microbiology: Results for orders placed or performed during the hospital encounter of 01/26/2021  Resp Panel by RT-PCR (Flu A&B, Covid) Nasopharyngeal Swab     Status: None   Collection Time: 01/17/2021  6:20 PM   Specimen: Nasopharyngeal Swab; Nasopharyngeal(NP) swabs in vial transport medium  Result Value Ref Range Status   SARS Coronavirus 2 by RT PCR NEGATIVE NEGATIVE Final    Comment: (NOTE) SARS-CoV-2 target nucleic acids are NOT DETECTED.  The SARS-CoV-2 RNA is generally detectable in upper  respiratory specimens during the acute phase of infection. The lowest concentration of SARS-CoV-2 viral copies this assay can detect is 138 copies/mL. A negative result does not preclude SARS-Cov-2 infection and should not be used as the sole basis for treatment or other patient management decisions. A negative result may occur with  improper specimen collection/handling, submission of specimen other than nasopharyngeal swab, presence of viral mutation(s) within the areas targeted by this assay, and inadequate number of viral copies(<138 copies/mL). A negative result must be combined with clinical observations, patient history, and epidemiological information. The expected result is Negative.  Fact Sheet for Patients:  EntrepreneurPulse.com.au  Fact Sheet for Healthcare Providers:  IncredibleEmployment.be  This test is no t yet approved or cleared by the [place] FDA and  has been authorized for detection and/or diagnosis of SARS-CoV-2 by FDA under an Emergency Use Authorization (EUA). This EUA will remain  in effect (meaning this test can be used) for the duration of the COVID-19 declaration under Section 564(b)(1) of the Act, 21 U.S.C.section 360bbb-3(b)(1), unless the authorization is terminated  or revoked sooner.       Influenza A by PCR NEGATIVE NEGATIVE Final   Influenza B by PCR NEGATIVE NEGATIVE Final    Comment: (NOTE) The Xpert Xpress SARS-CoV-2/FLU/RSV plus assay is intended as an aid in the diagnosis of influenza from Nasopharyngeal swab specimens and should not be used as a sole basis for treatment. Nasal washings and aspirates are unacceptable for Xpert Xpress SARS-CoV-2/FLU/RSV testing.  Fact Sheet for Patients: EntrepreneurPulse.com.au  Fact Sheet for Healthcare Providers: IncredibleEmployment.be  This test is not yet approved or cleared by the [place] FDA and has been  authorized for detection and/or diagnosis of SARS-CoV-2 by FDA under an Emergency Use Authorization (EUA). This EUA will remain in effect (meaning this test can be used) for the duration of the COVID-19 declaration under Section 564(b)(1) of the Act, 21 U.S.C. section 360bbb-3(b)(1), unless the authorization is terminated or revoked.  Performed at [facility], [address] [place], [place] [address]  Urine Culture     Status: None   Collection Time: 02/05/2021  6:22 PM   Specimen: Urine, Random  Result Value Ref Range Status   Specimen Description   Final    URINE, RANDOM Performed at [facility], [address] [place], [place] [address]    Special Requests   Final    NONE Performed at [facility], [address], [place], [place] [address]    Culture   Final    NO GROWTH Performed at [facility], [place] [address] [place], [place] [address]    Report Status 01/22/2021 FINAL  Final  Blood Culture (routine x 2)     Status: None   Collection Time: 01/25/2021  6:23 PM   Specimen: Left Antecubital; Blood  Result Value Ref Range Status   Specimen Description LEFT ANTECUBITAL  Final   Special Requests   Final    BOTTLES DRAWN AEROBIC AND ANAEROBIC Blood Culture results may not be optimal due to an inadequate volume of blood received in culture bottles   Culture   Final    NO GROWTH 5 DAYS Performed at [facility], [address] [place], [place] [address]    Report Status 01/25/2021 FINAL  Final  MRSA Next Gen by PCR, Nasal     Status: Abnormal   Collection Time: 01/14/2021  9:59 PM   Specimen: Nasal Mucosa; Nasal Swab  Result Value Ref Range Status   MRSA by PCR Next Gen DETECTED (A) NOT DETECTED Final    Comment: RESULT CALLED TO, READ BACK BY AND VERIFIED WITH: $RemoveBefore'@2318'[id number]$  Melissa Cobb 01/24/2021 (NOTE) The GeneXpert MRSA Assay (FDA approved for NASAL specimens only), is one component of a comprehensive MRSA colonization  surveillance program. It is not intended to diagnose MRSA infection nor to guide or monitor treatment for MRSA infections. Test performance is not FDA approved in patients less than 21 years old. Performed at [facility], [address] [place], [place] [address]   Culture, Respiratory w Gram Stain     Status: None   Collection Time: 01/21/21  8:35 AM   Specimen: Tracheal Aspirate; Respiratory  Result Value Ref Range Status   Specimen Description   Final    TRACHEAL ASPIRATE Performed at [facility], [address] [place], [place] [address]    Special Requests   Final    NONE Performed at [facility], [address] [place], [place] [address]    Gram Stain   Final    FEW WBC PRESENT, PREDOMINANTLY MONONUCLEAR FEW YEAST WITH PSEUDOHYPHAE RARE GRAM POSITIVE COCCI IN PAIRS Performed at [facility], [place] [address], [place], [place] [address]    Culture   Final    ABUNDANT METHICILLIN RESISTANT STAPHYLOCOCCUS AUREUS ABUNDANT CANDIDA ALBICANS    Report Status 01/24/2021 FINAL  Final   Organism ID, Bacteria METHICILLIN RESISTANT STAPHYLOCOCCUS AUREUS  Final      Susceptibility   Methicillin resistant staphylococcus aureus - MIC*    CIPROFLOXACIN >=8 RESISTANT Resistant     ERYTHROMYCIN >=8 RESISTANT Resistant     GENTAMICIN <=0.5 SENSITIVE Sensitive     OXACILLIN >=4 RESISTANT Resistant     TETRACYCLINE <=1 SENSITIVE Sensitive     VANCOMYCIN 1 SENSITIVE Sensitive     TRIMETH/SULFA >=320 RESISTANT Resistant     CLINDAMYCIN <=0.25 SENSITIVE Sensitive     RIFAMPIN <=0.5 SENSITIVE Sensitive     Inducible Clindamycin NEGATIVE Sensitive     * ABUNDANT  METHICILLIN RESISTANT STAPHYLOCOCCUS AUREUS  Culture, blood (Routine X 2) w Reflex to ID Panel     Status: None   Collection Time: 01/21/21 11:10 PM   Specimen: BLOOD  Result Value Ref Range Status   Specimen Description BLOOD LEFT HAND  Final   Special Requests   Final    BOTTLES DRAWN  AEROBIC AND ANAEROBIC Blood Culture adequate volume   Culture   Final    NO GROWTH 5 DAYS Performed at [facility], [address], [place], [place] [address]    Report Status 01/26/2021 FINAL  Final    Coagulation Studies: No results for input(s): LABPROT, INR in the last 72 hours.   Urinalysis: No results for input(s): COLORURINE, LABSPEC, PHURINE, GLUCOSEU, HGBUR, BILIRUBINUR, KETONESUR, PROTEINUR, UROBILINOGEN, NITRITE, LEUKOCYTESUR in the last 72 hours.  Invalid input(s): APPERANCEUR     Imaging: MR ANGIO HEAD WO CONTRAST  Result Date: 01/26/2021 CLINICAL DATA:  Anoxic brain damage EXAM: MRI HEAD WITHOUT CONTRAST MRA HEAD WITHOUT CONTRAST TECHNIQUE: Multiplanar, multi-echo pulse sequences of the brain and surrounding structures were acquired without intravenous contrast. Angiographic images of the Circle of Willis were acquired using MRA technique without intravenous contrast. COMPARISON:  No pertinent prior exam. FINDINGS: MRI HEAD Brain: Subcentimeter focus of restricted diffusion in the right centrum semiovale. No evidence of intracranial hemorrhage. There is no intracranial mass, mass effect, or edema. Ventricles and sulci are normal in size and configuration. Minimal patchy T2 hyperintensity in the supratentorial white matter is nonspecific but may reflect minor chronic microvascular ischemic changes. There is no hydrocephalus or extra-axial fluid collection. Vascular: Major vessel flow voids at the skull base are preserved. Skull and upper cervical spine: Normal marrow signal is preserved. Sinuses/Orbits: Paranasal sinuses are aerated. Orbits are unremarkable. Other: Sella is unremarkable.  Bilateral mastoid effusions. MRA HEAD Intracranial internal carotid arteries are patent. Middle and anterior cerebral arteries are patent. Intracranial vertebral arteries, basilar artery, posterior cerebral arteries are patent. Bilateral posterior communicating arteries are present.  There is no significant stenosis or aneurysm. IMPRESSION: Subcentimeter acute infarct of the right centrum semiovale. Unremarkable vascular imaging. Electronically Signed   By: Macy Mis M.D.   On: 01/26/2021 12:33   MR BRAIN WO CONTRAST  Result Date: 01/26/2021 CLINICAL DATA:  Anoxic brain damage EXAM: MRI HEAD WITHOUT CONTRAST MRA HEAD WITHOUT CONTRAST TECHNIQUE: Multiplanar, multi-echo pulse sequences of the brain and surrounding structures were acquired without intravenous contrast. Angiographic images of the Circle of Willis were acquired using MRA technique without intravenous contrast. COMPARISON:  No pertinent prior exam. FINDINGS: MRI HEAD Brain: Subcentimeter focus of restricted diffusion in the right centrum semiovale. No evidence of intracranial hemorrhage. There is no intracranial mass, mass effect, or edema. Ventricles and sulci are normal in size and configuration. Minimal patchy T2 hyperintensity in the supratentorial white matter is nonspecific but may reflect minor chronic microvascular ischemic changes. There is no hydrocephalus or extra-axial fluid collection. Vascular: Major vessel flow voids at the skull base are preserved. Skull and upper cervical spine: Normal marrow signal is preserved. Sinuses/Orbits: Paranasal sinuses are aerated. Orbits are unremarkable. Other: Sella is unremarkable.  Bilateral mastoid effusions. MRA HEAD Intracranial internal carotid arteries are patent. Middle and anterior cerebral arteries are patent. Intracranial vertebral arteries, basilar artery, posterior cerebral arteries are patent. Bilateral posterior communicating arteries are present. There is no significant stenosis or aneurysm. IMPRESSION: Subcentimeter acute infarct of the right centrum semiovale. Unremarkable vascular imaging. Electronically Signed   By: Addison Lank.D.  On: 01/26/2021 12:33     Medications:    sodium chloride Stopped (01/25/21 2320)   feeding supplement (VITAL AF 1.2  CAL) 1,000 mL (01/26/21 1718)   fentaNYL infusion INTRAVENOUS 100 mcg/hr (02/14/2021 0800)   niCARDipine 12.5 mg/hr (2021-02-14 0857)   norepinephrine (LEVOPHED) Adult infusion Stopped (Feb 14, 2021 0140)   propofol (DIPRIVAN) infusion 40 mcg/kg/min (Feb 14, 2021 0858)    budesonide (PULMICORT) nebulizer solution  0.5 mg Nebulization BID   chlorhexidine gluconate (MEDLINE KIT)  15 mL Mouth Rinse BID   Chlorhexidine Gluconate Cloth  6 each Topical Q0600   famotidine  20 mg Per Tube QODAY   feeding supplement (PROSource TF)  90 mL Per Tube TID   free water  30 mL Per Tube Q4H   insulin aspart  0-20 Units Subcutaneous Q4H   ipratropium-albuterol  3 mL Nebulization TID   mouth rinse  15 mL Mouth Rinse 10 times per day   methylPREDNISolone (SOLU-MEDROL) injection  40 mg Intravenous Q12H   multivitamin  1 tablet Per Tube QHS   docusate, fentaNYL, labetalol, midazolam, midazolam, polyethylene glycol  Assessment/ Plan:   Ms. Victoria Frey is a 61 y.o. white female with hypertension, COPD with tobacco abuse, diabetes mellitus type II, bipolar disorder, depression, who was admitted to [facility] on 02/09/2021 for Encounter for orogastric (OG) tube placement [Z46.59] Unresponsiveness [R41.89] Septic shock ([place]) [A41.9, R65.21] Acute renal failure, unspecified acute renal failure type ([place]) [N17.9] Pneumonia of both lungs due to infectious organism, unspecified part of lung [J18.9]  Patient's hospital course with acute pancreatitis, acute respiratory failure with ARDS and multiorgan failure. Initiated on CRRT on 01/21/21. Now has weaned off vasopressors. Remains anuric.   Acute kidney injury requiring renal replacement therapy. Baseline creatinine of 1.03 on 08/2014. No previous urine studies available.  - Monitor for dialysis need. - Plan on next scheduled intermittent dialysis for Monday 1/16.   Acute respiratory failure: requiring intubation and mechanical ventilation. With ARDS and acute exacerbation of COPD.    Sepsis and Hypotension: weaned off vasopressors. MRSA pneumonia - Stress dose steroids - Continue antibiotics: IV vancomycin  Overall prognosis is poor with acute CVA. Appreciate palliative, critical care and neuro input.   LOS: 7 Candance Bohlman 02-02-239:22 AM

## 2021-02-13 NOTE — Progress Notes (Signed)
Patient transitioned to comfort care measures. Patients family and friends at bedside. Morphine gtt started.

## 2021-02-13 NOTE — Progress Notes (Addendum)
Neurology Progress Note Victoria Frey MR# [id number] 2021/02/08   S: The patient's blood pressure fluctuated significantly throughout the night; no new complaints.  O: Current vital signs: BP (!) 138/52    Pulse 71    Temp 99.3 F (37.4 C)    Resp (!) 29    Ht 5' 7.01" (1.702 m)    Wt 86.7 kg    SpO2 99%    BMI 29.93 kg/m  Vital signs in last 24 hours: Temp:  [97.3 F (36.3 C)-100 F (37.8 C)] 99.3 F (37.4 C) (01/15 1100) Pulse Rate:  [66-101] 71 (01/15 1100) Resp:  [12-39] 29 (01/15 1100) BP: (116-173)/(39-64) 138/52 (01/15 1100) SpO2:  [92 %-100 %] 99 % (01/15 1116) Arterial Line BP: (107-236)/(39-69) 136/49 (01/15 1100) FiO2 (%):  [28 %-50 %] 28 % (01/15 1116) Weight:  [86.7 kg] 86.7 kg (01/15 0339) GENERAL: Appears well-developed and well-nourished.  Psych: Unable to assess due to encephalopathy and intubated. Eyes: No scleral injection HENT: No OP obstruction. Head: Normocephalic.  Cardiovascular: Normal rate and regular rhythm.  Respiratory: Ventilated, symmetric excursions bilaterally, no audible cuff leak or wheezing. GI: Soft. mild distension.  Skin: WDI   Neuro: Exam is unchanged as compared to yesterday Mental Status: Unable to assess due to encephalopathy and intubated. Visual Fields are full. Pupils are ~30mm equal, round, and sluggishly reactive to light. Face: ET tube in place. Unable to assess due to encephalopathy and intubated. Tone is normal. Bulk is normal. No extremity movement to noxious stimuli Deep Tendon Reflexes: Mute throughout. Toes are downgoing bilaterally. FNF and HKS Unable to assess due to encephalopathy and intubated. Gait - Deferred  NIHSS 28 (intubated)  Labs  Lab Results  Component Value Date   HGBA1C 6.0 (H) 01/26/2021   and  Lab Results  Component Value Date   LDLCALC 77 01/26/2021   Echocardiogram read as EF >75%, no WMA, no shunt, no mention of LVT.  Imaging I have reviewed images in epic and the results pertinent  to this consultation are: MRI brain showed Subcentimeter acute infarct of the right centrum semiovale. MRA head and neck showed Intracranial internal carotid arteries are patent. Middle and anterior cerebral arteries are patent. Intracranial vertebral arteries, basilar artery, posterior cerebral arteries are patent. Bilateral posterior communicating arteries are present. There is no significant stenosis or aneurysm.  Assessment: Victoria Frey is a 61 y.o. female PMHx as noted above, polysubstance with sepsis, acute respiratory failure with ARDS (intubated), bilateral pneumonia, acute kidney injury weaned off pressors and now anuric found to have small acute ischemic stroke in right centrum semiovale on MRI. She is no longer receiving CRRT and it may be prudent to start aspirin as secondary prevention.  Her a1c is at goal and LDL is only within goal.  She has had a complicated admission and stroke etiology is most likely multifactorial in setting of multiorgan system sepsis which has been identified as a trigger for stroke but, the underlying mechanisms and risk factors are not clear. However, sepsis especially from respiratory tract places patient at risk for new-onset atrial fibrillation in sepsis.  Her exam is out of proportion to what would be expected based on what is seen on imaging. Though the residual effects of the stroke would be very difficult to predict, the size and location are such that the spectrum of deficits would be none to minimal. Hypothetically, a sensory ataxia to mild weakness in her left arm.   Overnight the patient's blood pressure fluctuated significantly.  Ideally BP<140/90 however can temporarily loosen blood pressure parameters to 140-160.  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 Sepsis with multiorgan failure. MRSA pneumonia bilateral. AKI/anuria Mechanically ventilated. Hypotension - resolved.   Plan: - LDL 77 recommend low dose statin  for LDL < 70 - Consider aspirin 81mg  daily. - MAP >65 and SBP goal <140-160 if pressure continues to be very labile. - Telemetry monitoring for arrhythmia. - Recommend bedside Swallow screen when able. - Recommend Stroke education when able. - Recommend PT/OT/SLP consult when able. - Stroke workup is complete. - Neurology will remain available, please call for questions.   This patient is critically ill and at significant risk of neurological worsening, death and care requires constant monitoring of vital signs, hemodynamics,respiratory and cardiac monitoring, neurological assessment, discussion with family, other specialists and medical decision making of high complexity. I spent 55 minutes of neurocritical care time  in the care of  this patient. This was time spent independent of any time provided by nurse practitioner or PA.   Electronically signed by:  Lynnae Sandhoff, MD Page: [phone] 25-Feb-2021, 11:49 AM  If 7pm- 7am, please page neurology on call as listed in [place].

## 2021-02-13 DEATH — deceased

## 2021-05-16 ENCOUNTER — Ambulatory Visit: Payer: Medicare Other
